# Patient Record
Sex: Female | Born: 1945
Health system: Southern US, Community
[De-identification: ages and names within clinical notes are randomized; demographics above are authoritative.]

## PROBLEM LIST (undated history)

## (undated) DIAGNOSIS — H269 Unspecified cataract: Secondary | ICD-10-CM

## (undated) DIAGNOSIS — I10 Essential (primary) hypertension: Secondary | ICD-10-CM

## (undated) DIAGNOSIS — C801 Malignant (primary) neoplasm, unspecified: Secondary | ICD-10-CM

## (undated) DIAGNOSIS — T7840XA Allergy, unspecified, initial encounter: Secondary | ICD-10-CM

## (undated) DIAGNOSIS — H8109 Meniere's disease, unspecified ear: Secondary | ICD-10-CM

## (undated) HISTORY — DX: Meniere's disease, unspecified ear: H81.09

## (undated) HISTORY — DX: Essential (primary) hypertension: I10

## (undated) HISTORY — PX: ABDOMINAL HYSTERECTOMY: SHX81

## (undated) HISTORY — DX: Allergy, unspecified, initial encounter: T78.40XA

## (undated) HISTORY — DX: Malignant (primary) neoplasm, unspecified: C80.1

## (undated) HISTORY — PX: BREAST EXCISIONAL BIOPSY: SUR124

## (undated) HISTORY — DX: Unspecified cataract: H26.9

---

## 1989-04-24 HISTORY — PX: VAGINAL HYSTERECTOMY: SHX2639

## 1999-04-25 DIAGNOSIS — Z923 Personal history of irradiation: Secondary | ICD-10-CM

## 1999-04-25 HISTORY — PX: BREAST BIOPSY: SHX20

## 1999-04-25 HISTORY — DX: Personal history of irradiation: Z92.3

## 2000-01-25 HISTORY — PX: BREAST LUMPECTOMY: SHX2

## 2004-04-24 HISTORY — PX: COLONOSCOPY: SHX174

## 2018-05-02 ENCOUNTER — Ambulatory Visit (INDEPENDENT_AMBULATORY_CARE_PROVIDER_SITE_OTHER): Payer: Medicare Other | Admitting: Family Medicine

## 2018-05-02 ENCOUNTER — Encounter: Payer: Self-pay | Admitting: Family Medicine

## 2018-05-02 VITALS — BP 120/82 | HR 81 | Temp 98.1°F | Ht 63.0 in | Wt 136.5 lb

## 2018-05-02 DIAGNOSIS — B002 Herpesviral gingivostomatitis and pharyngotonsillitis: Secondary | ICD-10-CM | POA: Diagnosis not present

## 2018-05-02 DIAGNOSIS — H8103 Meniere's disease, bilateral: Secondary | ICD-10-CM | POA: Diagnosis not present

## 2018-05-02 DIAGNOSIS — Z23 Encounter for immunization: Secondary | ICD-10-CM | POA: Diagnosis not present

## 2018-05-02 DIAGNOSIS — I10 Essential (primary) hypertension: Secondary | ICD-10-CM | POA: Diagnosis not present

## 2018-05-02 DIAGNOSIS — L709 Acne, unspecified: Secondary | ICD-10-CM | POA: Diagnosis not present

## 2018-05-02 DIAGNOSIS — M792 Neuralgia and neuritis, unspecified: Secondary | ICD-10-CM | POA: Insufficient documentation

## 2018-05-02 MED ORDER — AMLODIPINE BESYLATE-VALSARTAN 5-320 MG PO TABS: 1.0000 | ORAL_TABLET | Freq: Every day | ORAL | 5 refills | Status: DC

## 2018-05-02 MED ORDER — TRAZODONE HCL 50 MG PO TABS: 25.0000 mg | ORAL_TABLET | Freq: Every evening | ORAL | 3 refills | Status: DC | PRN

## 2018-05-02 MED ORDER — MECLIZINE HCL 25 MG PO TABS: 25.0000 mg | ORAL_TABLET | Freq: Three times a day (TID) | ORAL | 0 refills | Status: DC | PRN

## 2018-05-02 MED ORDER — ACYCLOVIR 5 % EX OINT: 1.0000 "application " | TOPICAL_OINTMENT | CUTANEOUS | 2 refills | Status: DC

## 2018-05-02 MED ORDER — VALACYCLOVIR HCL 500 MG PO TABS: ORAL_TABLET | ORAL | 2 refills | Status: DC

## 2018-05-02 MED ORDER — VENLAFAXINE HCL ER 37.5 MG PO CP24: 37.5000 mg | ORAL_CAPSULE | Freq: Every day | ORAL | 2 refills | Status: DC

## 2018-05-02 MED ORDER — MINOCYCLINE HCL 50 MG PO CAPS: 50.0000 mg | ORAL_CAPSULE | Freq: Two times a day (BID) | ORAL | 2 refills | Status: DC

## 2018-05-02 MED ORDER — CLINDAMYCIN PHOSPHATE 1 % EX LOTN: TOPICAL_LOTION | Freq: Two times a day (BID) | CUTANEOUS | 5 refills | Status: DC

## 2018-05-02 NOTE — Progress Notes (Signed)
Chief Complaint  Patient presents with  . New Patient (Initial Visit)       New Patient Visit SUBJECTIVE: HPI: Carolyn Mckinney is an 73 y.o.female who is being seen for establishing care.   The patient was previously seen [place].  All of her medicines are [place] medications and not carried to the [place].  Hypertension Patient presents for hypertension follow up. She does not routinely monitor home blood pressures. She is compliant with medications- amlodipine equivalent and an ARB. Patient has these side effects of medication: none She is adhering to a healthy diet overall. Exercise: wt resistance, cardio   Hx of Meniere's disease. On Japanese meds that I do not recognize. Adenosine triphosphate, something that appears to be an antihistamine prn and difenidol hydrochloride.  She will intermittently get vertigo.  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  Testing for diabetes and thyroid disease were unremarkable in [place].  She takes an anti-inflammatory similar to ibuprofen and Lyrica for this.  She notes that it was helpful initially but no longer is.  There is no injury or change in activity.  She does not have any balance issues or weakness.  The patient has a history of acne.  She will take oral minocycline or topical antibiotic alternating.  She has a history of oral herpes.  She takes famciclovir and a topical antiviral for flares.  No current outbreaks.  No Known Allergies  Past Medical History:  Diagnosis Date  . Hypertension   . Meniere's disease    Past Surgical History:  Procedure Laterality Date  . ABDOMINAL HYSTERECTOMY    . BREAST LUMPECTOMY     Family History  Problem Relation Age of Onset  . Heart disease Father    No Known Allergies  Current Outpatient Medications:  .  acyclovir ointment (ZOVIRAX) 5 %, Apply 1 application topically every 3 (three) hours. Apply every 3 hours during a breakout., Disp: 30 g, Rfl: 2 .   amLODipine-valsartan (EXFORGE) 5-320 MG tablet, Take 1 tablet by mouth daily., Disp: 30 tablet, Rfl: 5 .  clindamycin (CLEOCIN T) 1 % lotion, Apply topically 2 (two) times daily., Disp: 60 mL, Rfl: 5 .  meclizine (ANTIVERT) 25 MG tablet, Take 1 tablet (25 mg total) by mouth 3 (three) times daily as needed for dizziness., Disp: 30 tablet, Rfl: 0 .  minocycline (MINOCIN,DYNACIN) 50 MG capsule, Take 1 capsule (50 mg total) by mouth 2 (two) times daily., Disp: 60 capsule, Rfl: 2 .  traZODone (DESYREL) 50 MG tablet, Take 0.5-1 tablets (25-50 mg total) by mouth at bedtime as needed for sleep., Disp: 30 tablet, Rfl: 3 .  valACYclovir (VALTREX) 500 MG tablet, Take 4 tabs and repeat in 12 hours at onset of a breakout., Disp: 24 tablet, Rfl: 2 .  venlafaxine XR (EFFEXOR-XR) 37.5 MG 24 hr capsule, Take 1 capsule (37.5 mg total) by mouth daily with breakfast., Disp: 30 capsule, Rfl: 2  ROS Cardiovascular: Denies chest pain  Respiratory: Denies dyspnea   OBJECTIVE: BP 120/82 (BP Location: Left Arm, Patient Position: Sitting, Cuff Size: Normal)   Pulse 81   Temp 98.1 F (36.7 C) (Oral)   Ht 5\' 3"  (1.6 m)   Wt 136 lb 8 oz (61.9 kg)   SpO2 97%   BMI 24.18 kg/m   Constitutional: -  VS reviewed -  Well developed, well nourished, appears stated age -  No apparent distress  Psychiatric: -  Oriented to person, place, and time -  Memory intact -  Affect and mood normal -  Fluent conversation, good eye contact -  Judgment and insight age appropriate  Eye: -  Conjunctivae clear, no discharge -  Pupils symmetric, round, reactive to light  ENMT: -  MMM    Pharynx moist, no exudate, no erythema  Neck: -  No gross swelling, no palpable masses -  Thyroid midline, not enlarged, mobile, no palpable masses  Cardiovascular: -  RRR -  2+ DP pulses bilaterally -  No bruits -  No LE edema  Respiratory: -  Normal respiratory effort, no accessory muscle use, no retraction -  Breath sounds equal, no wheezes, no  ronchi, no crackles  Neurological:  -  CN II - XII grossly intact -  Sensation intact to pinprick on feet  Musculoskeletal: -  No clubbing, no cyanosis -  Gait normal  Skin: -  No significant acne on expection -  There are black macules on lower extremities bilaterally -  Warm and dry to palpation   ASSESSMENT/PLAN: Meniere disease, bilateral - Plan: meclizine (ANTIVERT) 25 MG tablet, Ambulatory referral to ENT  Essential hypertension - Plan: amLODipine-valsartan (EXFORGE) 5-320 MG tablet  Neuropathic pain - Plan: traZODone (DESYREL) 50 MG tablet, venlafaxine XR (EFFEXOR-XR) 37.5 MG 24 hr capsule  Acne, unspecified acne type - Plan: minocycline (MINOCIN,DYNACIN) 50 MG capsule, clindamycin (CLEOCIN T) 1 % lotion  Need for tetanus booster - Plan: Tdap vaccine greater than or equal to 7yo IM  Need for vaccination against Streptococcus pneumoniae - Plan: Pneumococcal polysaccharide vaccine 23-valent greater than or equal to 74yo subcutaneous/IM  Oral herpes - Plan: valACYclovir (VALTREX) 500 MG tablet, acyclovir ointment (ZOVIRAX) 5 %  Some alternatives/equivalents were called in.  I had difficulty with Mnire's disease medications.  Will refer to ENT for further management. Will change anti-inflammatory/Lyrica to venlafaxine. Vaccines due today. Continue antiviral as needed for oral herpes. Continue acne regimen. Call pharmacy for Shingrix. Patient should return in 1 mo to recheck neuropathy. The patient voiced understanding and agreement to the plan.  Greater than 45 minutes were spent face to face with the patient with greater than 50% of this time spent counseling on medication reconciliation, diagnosis, lab review, prognosis, medication prescription, new managements, immunizations and follow up.   [place], DO 05/02/18  5:15 PM

## 2018-05-02 NOTE — Progress Notes (Signed)
Pre visit review using our clinic review tool, if applicable. No additional management support is needed unless otherwise documented below in the visit note. 

## 2018-05-02 NOTE — Patient Instructions (Addendum)
Keep the diet clean and stay active.  We did the best we could with your medications and translating things to American equivalents. I had the toughest time with the Meniere's Disease and that is why we are setting you up with the ENT team.   If you do not hear anything about your referral in the next 1-2 weeks, call our office and ask for an update.  Call the pharmacy to set up the Shingrix (shingles vaccine).   Let us know if you need anything.

## 2018-05-22 DIAGNOSIS — H8103 Meniere's disease, bilateral: Secondary | ICD-10-CM | POA: Diagnosis not present

## 2018-05-30 ENCOUNTER — Ambulatory Visit: Payer: Medicare Other | Admitting: Family Medicine

## 2018-05-30 ENCOUNTER — Encounter: Payer: Self-pay | Admitting: Family Medicine

## 2018-05-30 ENCOUNTER — Other Ambulatory Visit: Payer: Self-pay | Admitting: Family Medicine

## 2018-06-03 ENCOUNTER — Encounter: Payer: Self-pay | Admitting: Family Medicine

## 2018-06-03 ENCOUNTER — Ambulatory Visit (INDEPENDENT_AMBULATORY_CARE_PROVIDER_SITE_OTHER): Payer: Medicare Other | Admitting: Family Medicine

## 2018-06-03 VITALS — BP 120/62 | HR 87 | Temp 97.8°F | Ht 63.0 in | Wt 140.0 lb

## 2018-06-03 DIAGNOSIS — M792 Neuralgia and neuritis, unspecified: Secondary | ICD-10-CM | POA: Diagnosis not present

## 2018-06-03 DIAGNOSIS — L709 Acne, unspecified: Secondary | ICD-10-CM | POA: Diagnosis not present

## 2018-06-03 DIAGNOSIS — G4700 Insomnia, unspecified: Secondary | ICD-10-CM | POA: Insufficient documentation

## 2018-06-03 DIAGNOSIS — B002 Herpesviral gingivostomatitis and pharyngotonsillitis: Secondary | ICD-10-CM | POA: Diagnosis not present

## 2018-06-03 MED ORDER — NORTRIPTYLINE HCL 25 MG PO CAPS: 25.0000 mg | ORAL_CAPSULE | Freq: Every day | ORAL | 1 refills | Status: DC

## 2018-06-03 MED ORDER — MINOCYCLINE HCL 100 MG PO CAPS: 100.0000 mg | ORAL_CAPSULE | Freq: Two times a day (BID) | ORAL | 5 refills | Status: DC

## 2018-06-03 MED ORDER — TRAZODONE HCL 50 MG PO TABS: 50.0000 mg | ORAL_TABLET | Freq: Every evening | ORAL | 1 refills | Status: DC | PRN

## 2018-06-03 MED ORDER — MINOCYCLINE HCL 100 MG PO CAPS: 100.0000 mg | ORAL_CAPSULE | Freq: Two times a day (BID) | ORAL | 1 refills | Status: DC

## 2018-06-03 MED ORDER — VALACYCLOVIR HCL 500 MG PO TABS: 500.0000 mg | ORAL_TABLET | Freq: Every day | ORAL | 1 refills | Status: DC

## 2018-06-03 NOTE — Progress Notes (Signed)
Chief Complaint  Patient presents with  . Follow-up    Subjective: Patient is a 73 y.o. female here for reck neuropathy.  Lyrica and ibuprofen in past was not helpful. Started on Effexor 37.5 mg/d, was compliant but it was not helpful. Worsens when she gets poor sleep.   Sleep improved with Trazodone. Tolerating well. Might be related to time zone changes after moving back from [place].   BP well controlled on current meds.  Acne coming back. Was on minocycline 100 mg bid, but I restarted her on 50 mg bid. Tolerating well otherwise.  Hx of cold sores, worse with stress after moving. Was on topical acyclovir in [place] but $200 here. Abreva in past has been helpful. On Valacyclovir now.   ROS: Neuro: +dizziness Psych: No insomnia  Past Medical History:  Diagnosis Date  . Allergy   . Cancer (HCC)    Breast  . Hypertension   . Meniere's disease    Objective: BP 120/62 (BP Location: Left Arm)   Pulse 87   Temp 97.8 F (36.6 C) (Oral)   Ht 5\' 3"  (1.6 m)   Wt 140 lb (63.5 kg)   SpO2 97%   BMI 24.80 kg/m  General: Awake, appears stated age HEENT: MMM, EOMi Heart: RRR, no murmurs Lungs: CTAB, no rales, wheezes or rhonchi. No accessory muscle use Psych: Age appropriate judgment and insight, normal affect and mood  Assessment and Plan: Neuropathic pain - Plan: nortriptyline (PAMELOR) 25 MG capsule  Oral herpes - Plan: valACYclovir (VALTREX) 500 MG tablet  Acne, unspecified acne type - Plan: minocycline (MINOCIN,DYNACIN) 100 MG capsule  Insomnia, unspecified type - Plan: traZODone (DESYREL) 50 MG tablet  Stop SNRI, start TCA. 500 mg/d for prophylaxis.  Increase dose of Mino. Cont Trazodone.  F/u in 2 mo (traveling to [facility] for 6 weeks this Sat). The patient voiced understanding and agreement to the plan.  [place], DO 06/03/18  2:27 PM

## 2018-06-03 NOTE — Patient Instructions (Signed)
Stay hydrated.  Consider drinking things with electrolytes like Gatorade/Powerade.  Some changes have been made with your medications. This should be clear at pharmacy and on your paperwork if there are any questions.   Let us know if you need anything.

## 2018-07-15 ENCOUNTER — Encounter: Payer: Self-pay | Admitting: Family Medicine

## 2018-07-16 ENCOUNTER — Encounter: Payer: Self-pay | Admitting: Family Medicine

## 2018-07-18 ENCOUNTER — Ambulatory Visit: Payer: Medicare Other | Admitting: Family Medicine

## 2018-07-25 ENCOUNTER — Other Ambulatory Visit: Payer: Self-pay | Admitting: Family Medicine

## 2018-07-25 DIAGNOSIS — M792 Neuralgia and neuritis, unspecified: Secondary | ICD-10-CM

## 2018-08-02 ENCOUNTER — Ambulatory Visit: Payer: Medicare Other | Admitting: Family Medicine

## 2018-08-07 ENCOUNTER — Encounter: Payer: Self-pay | Admitting: Family Medicine

## 2018-08-08 ENCOUNTER — Encounter: Payer: Self-pay | Admitting: Family Medicine

## 2018-08-09 ENCOUNTER — Encounter: Payer: Self-pay | Admitting: Family Medicine

## 2018-08-09 ENCOUNTER — Other Ambulatory Visit: Payer: Self-pay

## 2018-08-09 ENCOUNTER — Ambulatory Visit (INDEPENDENT_AMBULATORY_CARE_PROVIDER_SITE_OTHER): Payer: Medicare Other | Admitting: Family Medicine

## 2018-08-09 DIAGNOSIS — M792 Neuralgia and neuritis, unspecified: Secondary | ICD-10-CM | POA: Diagnosis not present

## 2018-08-09 DIAGNOSIS — G4700 Insomnia, unspecified: Secondary | ICD-10-CM

## 2018-08-09 MED ORDER — GABAPENTIN 100 MG PO CAPS: ORAL_CAPSULE | ORAL | 3 refills | Status: DC

## 2018-08-09 MED ORDER — TRAZODONE HCL 50 MG PO TABS: 50.0000 mg | ORAL_TABLET | Freq: Every evening | ORAL | 3 refills | Status: DC | PRN

## 2018-08-09 NOTE — Progress Notes (Signed)
CC: neuropathy  Subjective: Patient is a 73 y.o. female here for neuropathic pain. Due to outbreak, we are interacting via web portal for an electronic face-to-face visit. I verified patient's ID using 2 identifiers.   Discussed this on 06/03/2018. Was initially started on Effexor, changed to nortriptyline after the former was not helpful. Nortriptyline made her feel like her skin was crawling so she stopped. Still having issues. Not worsening, but it is affecting her sleep.  Trazodone helpful, but having to take more. Seems to work when she takes 2 tabs instead of one. No AE's with medication.   ROS: Neuro: As noted in HPI Psych: +insomnia  Past Medical History:  Diagnosis Date  . Allergy   . Cancer (HCC)    Breast  . Hypertension   . Meniere's disease     Objective: No conversational dyspnea Age appropriate judgment and insight Nml affect and mood  Assessment and Plan: Neuropathic pain - Plan: gabapentin (NEURONTIN) 100 MG capsule  Insomnia, unspecified type - Plan: traZODone (DESYREL) 50 MG tablet  Stop TCA, start neurontin. 200 mg in evening, 100 mg in AM. Increase dose of trazodone to 50-100 mg qhs prn.  F/u in 5 weeks.  The patient voiced understanding and agreement to the plan.  [place], DO 08/09/18  9:43 AM

## 2018-09-13 ENCOUNTER — Encounter: Payer: Self-pay | Admitting: Family Medicine

## 2018-09-13 ENCOUNTER — Other Ambulatory Visit: Payer: Self-pay

## 2018-09-13 ENCOUNTER — Ambulatory Visit (INDEPENDENT_AMBULATORY_CARE_PROVIDER_SITE_OTHER): Payer: Medicare Other | Admitting: Family Medicine

## 2018-09-13 DIAGNOSIS — G4700 Insomnia, unspecified: Secondary | ICD-10-CM | POA: Diagnosis not present

## 2018-09-13 DIAGNOSIS — M792 Neuralgia and neuritis, unspecified: Secondary | ICD-10-CM

## 2018-09-13 DIAGNOSIS — R42 Dizziness and giddiness: Secondary | ICD-10-CM

## 2018-09-13 MED ORDER — TRIAMTERENE-HCTZ 37.5-25 MG PO TABS: 1.0000 | ORAL_TABLET | Freq: Every day | ORAL | 3 refills | Status: DC

## 2018-09-13 MED ORDER — GABAPENTIN 100 MG PO CAPS: ORAL_CAPSULE | ORAL | 3 refills | Status: DC

## 2018-09-13 NOTE — Progress Notes (Signed)
Chief Complaint  Patient presents with  . Follow-up    Subjective: Patient is a 73 y.o. female here for f/u neuropathic pain. Due to COVID-19 pandemic, we are interacting via web portal for an electronic face-to-face visit. I verified patient's ID using 2 identifiers. Patient agreed to proceed with visit via this method. Patient is at home, I am at office. Patient and I are present for visit.   Pt failed both nortriptyline and Effexor for neuropathic pain. Started on Neurontin at last visit. Reports worsening of pins/needles sensation in hands and feet. No AE's and was compliant with medication.  She also has a hx of spots on her lower ext that she is wondering if it could be related. They never fade, was told in [place] it is due to an old BP med she was taking. Biopsy revealed no CA.   Trazodone dosage also increased. Pt reports this works well when seh takes it. Would like to find out why she is unable to sleep. She admits she has not been on a steady routine since moving form [place]. She just got back from [place] also. Takes Benadryl and melatonin also.  Continues to have vertigo. Saw ENT for hx of Menire Dz, currently taking Maxzide with no improvement of s/s's. Tolerating med well w/o AE's.    ROS: Neuro: As noted in HPI Psych: +insomnia  Past Medical History:  Diagnosis Date  . Allergy   . Cancer (HCC)    Breast  . Hypertension   . Meniere's disease     Objective: No conversational dyspnea Age appropriate judgment and insight Nml affect and mood  Assessment and Plan: Neuropathic pain - Plan: Ambulatory referral to Neurology  Insomnia, unspecified type  Vertigo - Plan: Ambulatory referral to Neurology  Increase gabapentin to 200 mg in AM and afternoon, 400 mg before bed. Refer to neuro.  Cont Trazodone. [facility] info given. Sleep hygiene info given. Cont Maxzide, get Neuro's opinion. F/u in 6 mo for med ck. The patient voiced understanding and agreement to the  plan.  [place], DO 09/13/18  2:05 PM

## 2018-09-13 NOTE — Telephone Encounter (Signed)
Unable to leave voice message to inform pt to Return in about 6 months (around 03/16/2019) for HTN ck.

## 2018-11-06 ENCOUNTER — Other Ambulatory Visit: Payer: Self-pay | Admitting: Family Medicine

## 2018-11-06 DIAGNOSIS — B002 Herpesviral gingivostomatitis and pharyngotonsillitis: Secondary | ICD-10-CM

## 2018-11-06 MED ORDER — AMLODIPINE BESYLATE 5 MG PO TABS: 5.0000 mg | ORAL_TABLET | Freq: Every day | ORAL | 1 refills | Status: DC

## 2018-11-06 MED ORDER — VALACYCLOVIR HCL 500 MG PO TABS: 500.0000 mg | ORAL_TABLET | Freq: Every day | ORAL | 1 refills | Status: DC

## 2018-11-06 MED ORDER — VALSARTAN 320 MG PO TABS: 320.0000 mg | ORAL_TABLET | Freq: Every day | ORAL | 1 refills | Status: DC

## 2018-11-06 NOTE — Telephone Encounter (Signed)
Refills sent

## 2018-11-06 NOTE — Telephone Encounter (Signed)
Medication Refill - Medication: amLODipine (NORVASC) 5 MG tablet and valsartan (DIOVAN) 320 MG tablet and valACYclovir (VALTREX) 500 MG tablet   Preferred Pharmacy (with phone number or street name):  [facility], [place]. [phone] (Phone) [phone] (Fax)

## 2018-11-25 DIAGNOSIS — M9901 Segmental and somatic dysfunction of cervical region: Secondary | ICD-10-CM | POA: Diagnosis not present

## 2018-11-25 DIAGNOSIS — M50323 Other cervical disc degeneration at C6-C7 level: Secondary | ICD-10-CM | POA: Diagnosis not present

## 2018-11-25 DIAGNOSIS — M5117 Intervertebral disc disorders with radiculopathy, lumbosacral region: Secondary | ICD-10-CM | POA: Diagnosis not present

## 2018-11-25 DIAGNOSIS — M62830 Muscle spasm of back: Secondary | ICD-10-CM | POA: Diagnosis not present

## 2018-11-25 DIAGNOSIS — M9903 Segmental and somatic dysfunction of lumbar region: Secondary | ICD-10-CM | POA: Diagnosis not present

## 2018-11-28 DIAGNOSIS — M50323 Other cervical disc degeneration at C6-C7 level: Secondary | ICD-10-CM | POA: Diagnosis not present

## 2018-11-28 DIAGNOSIS — M5117 Intervertebral disc disorders with radiculopathy, lumbosacral region: Secondary | ICD-10-CM | POA: Diagnosis not present

## 2018-11-28 DIAGNOSIS — M9901 Segmental and somatic dysfunction of cervical region: Secondary | ICD-10-CM | POA: Diagnosis not present

## 2018-11-28 DIAGNOSIS — M9903 Segmental and somatic dysfunction of lumbar region: Secondary | ICD-10-CM | POA: Diagnosis not present

## 2018-11-28 DIAGNOSIS — M62830 Muscle spasm of back: Secondary | ICD-10-CM | POA: Diagnosis not present

## 2018-12-19 ENCOUNTER — Ambulatory Visit (INDEPENDENT_AMBULATORY_CARE_PROVIDER_SITE_OTHER): Payer: Medicare Other

## 2018-12-19 ENCOUNTER — Other Ambulatory Visit: Payer: Self-pay

## 2018-12-19 DIAGNOSIS — Z23 Encounter for immunization: Secondary | ICD-10-CM

## 2019-01-13 ENCOUNTER — Encounter: Payer: Self-pay | Admitting: Family Medicine

## 2019-01-14 ENCOUNTER — Telehealth: Payer: Self-pay | Admitting: Family Medicine

## 2019-01-14 NOTE — Telephone Encounter (Signed)
Copied from [place] [phone] [id number]. Topic: Appointment Scheduling - Scheduling Inquiry for Clinic >> Jan 14, 2019  1:21 PM Carolyn Mckinney wrote: Reason for CRM: pt need acute appt to determine if she has shingles. Attempt to call office but no answer  Called the patient twice and no answer///no voice mail to leave a message

## 2019-01-15 ENCOUNTER — Ambulatory Visit (INDEPENDENT_AMBULATORY_CARE_PROVIDER_SITE_OTHER): Payer: Medicare Other | Admitting: Family Medicine

## 2019-01-15 ENCOUNTER — Encounter: Payer: Self-pay | Admitting: Family Medicine

## 2019-01-15 ENCOUNTER — Other Ambulatory Visit: Payer: Self-pay

## 2019-01-15 VITALS — BP 122/64 | HR 96 | Temp 97.3°F | Ht 63.0 in | Wt 146.2 lb

## 2019-01-15 DIAGNOSIS — L299 Pruritus, unspecified: Secondary | ICD-10-CM

## 2019-01-15 DIAGNOSIS — M792 Neuralgia and neuritis, unspecified: Secondary | ICD-10-CM

## 2019-01-15 MED ORDER — PREGABALIN 150 MG PO CAPS: 150.0000 mg | ORAL_CAPSULE | Freq: Two times a day (BID) | ORAL | 2 refills | Status: DC

## 2019-01-15 MED ORDER — PREDNISONE 20 MG PO TABS: 40.0000 mg | ORAL_TABLET | Freq: Every day | ORAL | 0 refills | Status: AC

## 2019-01-15 NOTE — Progress Notes (Signed)
Chief Complaint  Patient presents with  . Herpes Zoster  . Medication Problem    Gabapentin problems//not helping     Carolyn Mckinney is a 73 y.o. female here for a skin complaint.  Duration: 3 weeks Location: R chest and upper R back Pruritic? Yes Painful? Yes, ache over the top Drainage? No New soaps/lotions/topicals/detergents? No Sick contacts? No Therapies tried thus far: topical abx irritated things  Patient continues to have problems with neuropathy in her lower extremities.  She is compliant with her gabapentin.  Dosage increase is have not helped.  She was referred to the neurology team almost 4 months ago.  Both our office and at the neurology office has tried to reach out to her to no avail.  She confirmed her contact information today.  She does not have any side effects with the gabapentin and reports continued compliance.  She does not recall being on any other medication for neuropathy.  I had placed her on Effexor and nortriptyline without relief.  ROS:  Const: No fevers Skin: As noted in HPI  Past Medical History:  Diagnosis Date  . Allergy   . Cancer (HCC)    Breast  . Hypertension   . Meniere's disease     BP 122/64 (BP Location: Left Arm, Patient Position: Sitting, Cuff Size: Normal)   Pulse 96   Temp (!) 97.3 F (36.3 C) (Temporal)   Ht 5\' 3"  (1.6 m)   Wt 146 lb 4 oz (66.3 kg)   SpO2 97%   BMI 25.91 kg/m  Gen: awake, alert, appearing stated age Lungs: No accessory muscle use Skin: No external lesions, there are 2 discrete areas of an excoriated and erythematous papule. No drainage, vesicles, TTP, fluctuance, excoriation Psych: Age appropriate judgment and insight  Pruritus - Plan: predniSONE (DELTASONE) 20 MG tablet  Neuropathic pain - Plan: pregabalin (LYRICA) 150 MG capsule  1-this does not look like shingles to me.  It does not follow the temple shingles either.  She cannot reach the area of itchiness on her back, will do an oral steroid.   Avoid scented products.  Try to moisturize as reach will allow. 2-change gabapentin to pregabalin.  She was given the contact information for the neurology team's office.  She agrees to call them. F/u prn. The patient voiced understanding and agreement to the plan.  [place], DO 01/15/19 12:13 PM

## 2019-01-15 NOTE — Patient Instructions (Addendum)
Try not to itch. This does not look like Shingles.   Ice/cold pack over area for 10-15 min twice daily.  Call this number for the Neurology team: [phone]  Let us know if you need anything.

## 2019-01-26 ENCOUNTER — Encounter: Payer: Self-pay | Admitting: Family Medicine

## 2019-01-26 DIAGNOSIS — G4700 Insomnia, unspecified: Secondary | ICD-10-CM

## 2019-01-27 ENCOUNTER — Encounter: Payer: Self-pay | Admitting: Family Medicine

## 2019-01-27 ENCOUNTER — Other Ambulatory Visit: Payer: Self-pay | Admitting: Family Medicine

## 2019-01-27 MED ORDER — TRIAMTERENE-HCTZ 37.5-25 MG PO TABS: 1.0000 | ORAL_TABLET | Freq: Every day | ORAL | 3 refills | Status: DC

## 2019-01-27 MED ORDER — TRAZODONE HCL 50 MG PO TABS: 50.0000 mg | ORAL_TABLET | Freq: Every evening | ORAL | 3 refills | Status: DC | PRN

## 2019-01-27 NOTE — Telephone Encounter (Signed)
Refill sent.

## 2019-01-27 NOTE — Telephone Encounter (Signed)
Pt called in to follow up on refill request. Pt says that she also need Rx for triamterene-hydrochlorothiazide (MAXZIDE-25) 37.5-25 MG tablet, pt says that she switched pharmacies.    Pharmacy: [facility], [place], [place]

## 2019-02-06 ENCOUNTER — Encounter: Payer: Self-pay | Admitting: Family Medicine

## 2019-02-07 ENCOUNTER — Other Ambulatory Visit: Payer: Self-pay | Admitting: Family Medicine

## 2019-02-07 DIAGNOSIS — M792 Neuralgia and neuritis, unspecified: Secondary | ICD-10-CM

## 2019-02-07 NOTE — Progress Notes (Signed)
am

## 2019-02-27 ENCOUNTER — Encounter: Payer: Self-pay | Admitting: Neurology

## 2019-03-07 ENCOUNTER — Encounter: Payer: Self-pay | Admitting: Family Medicine

## 2019-03-10 ENCOUNTER — Encounter: Payer: Self-pay | Admitting: Family Medicine

## 2019-03-13 ENCOUNTER — Encounter: Payer: Self-pay | Admitting: Family Medicine

## 2019-03-14 ENCOUNTER — Ambulatory Visit (INDEPENDENT_AMBULATORY_CARE_PROVIDER_SITE_OTHER): Payer: Medicare Other | Admitting: Family Medicine

## 2019-03-14 ENCOUNTER — Other Ambulatory Visit: Payer: Self-pay

## 2019-03-14 ENCOUNTER — Encounter: Payer: Self-pay | Admitting: Family Medicine

## 2019-03-14 DIAGNOSIS — Z5329 Procedure and treatment not carried out because of patient's decision for other reasons: Secondary | ICD-10-CM

## 2019-03-14 DIAGNOSIS — Z91199 Patient's noncompliance with other medical treatment and regimen due to unspecified reason: Secondary | ICD-10-CM

## 2019-03-14 NOTE — Progress Notes (Signed)
Despite several attempts, we could not reach the patient.

## 2019-03-17 ENCOUNTER — Ambulatory Visit (INDEPENDENT_AMBULATORY_CARE_PROVIDER_SITE_OTHER): Payer: Medicare Other | Admitting: Family Medicine

## 2019-03-17 ENCOUNTER — Encounter: Payer: Self-pay | Admitting: Family Medicine

## 2019-03-17 DIAGNOSIS — L709 Acne, unspecified: Secondary | ICD-10-CM

## 2019-03-17 MED ORDER — BENZOYL PEROXIDE 5 % EX GEL: Freq: Every day | CUTANEOUS | 0 refills | Status: DC

## 2019-03-17 NOTE — Progress Notes (Signed)
Chief Complaint  Patient presents with  . Medication Problem    minocycline not working well    Carolyn Mckinney is a 73 y.o. female here for a skin complaint. Due to COVID-19 pandemic, we are interacting via web portal for an electronic face-to-face visit. I verified patient's ID using 2 identifiers. Patient agreed to proceed with visit via this method. Patient is at home, I am at office. Patient and I are present for visit.   F/u acne, currently on Minocycline 100 mg/d. No AE's, reports compliance. Hx of scarring. Has been on a variety of abx in past and even a PO retinoid that was temporary. No drainage or current scarring. Not using any topicals right now.   ROS:  Const: No fevers Skin: As noted in HPI  Past Medical History:  Diagnosis Date  . Allergy   . Cancer (HCC)    Breast  . Hypertension   . Meniere's disease    Exam No conversational dyspnea Age appropriate judgment and insight Nml affect and mood  Acne, unspecified acne type - Plan: benzoyl peroxide 5 % gel  Cont minocycline, start above.  F/u in 4 weeks. The patient voiced understanding and agreement to the plan.  [place], DO 03/17/19 3:05 PM

## 2019-04-03 ENCOUNTER — Other Ambulatory Visit: Payer: Self-pay

## 2019-04-04 ENCOUNTER — Encounter: Payer: Self-pay | Admitting: Neurology

## 2019-04-04 ENCOUNTER — Other Ambulatory Visit: Payer: Self-pay

## 2019-04-04 ENCOUNTER — Ambulatory Visit (INDEPENDENT_AMBULATORY_CARE_PROVIDER_SITE_OTHER): Payer: Medicare Other | Admitting: Neurology

## 2019-04-04 VITALS — BP 145/67 | HR 99 | Resp 16 | Ht 63.0 in | Wt 149.0 lb

## 2019-04-04 DIAGNOSIS — R202 Paresthesia of skin: Secondary | ICD-10-CM

## 2019-04-04 NOTE — Progress Notes (Signed)
[facility] Clinic Note - Initial Visit   Date: 04/04/19  Carolyn Mckinney MRN: [id number] DOB: [birth year]   Dear Dr. Nani Ravens:   Thank you for your kind referral of Carolyn Mckinney for consultation of bilateral leg pain. Although her history is well known to you, please allow [place] to reiterate it for the purpose of our medical record. The patient was accompanied to the clinic by self.    History of Present Illness: Carolyn Mckinney is a 73 y.o. left-handed female with hypertension, breast cancer and Meniere's disease presenting for evaluation of bilateral leg pain.  Starting around 2010, she was having burning sensation over the feet and soles.  Over the past several years, symptoms have become worse and extended into the ankle region.  No radicular leg pain. 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  She was evaluated for these symptoms in [place] and offered a trial of Lyrica and NSAIDs with no relief.  She established care with Dr. Nani Ravens here who has tried venlafaxine, nortriptyline, and gabapentin with no relief.  Currently, she takes Lyrica 150mg  BID which eases some of her pain.  She has no history of diabetes. She drinks 1-2 glasses of wine nightly.     Past Medical History:  Diagnosis Date  . Allergy   . Cancer (HCC)    Breast  . Hypertension   . Meniere's disease     Past Surgical History:  Procedure Laterality Date  . ABDOMINAL HYSTERECTOMY    . BREAST LUMPECTOMY       Medications:  Outpatient Encounter Medications as of 04/04/2019  Medication Sig  . amLODipine (NORVASC) 5 MG tablet Take 1 tablet (5 mg total) by mouth daily.  . benzoyl peroxide 5 % gel Apply topically daily.  . minocycline (MINOCIN,DYNACIN) 100 MG capsule Take 1 capsule (100 mg total) by mouth 2 (two) times daily.  . pregabalin (LYRICA) 150 MG capsule Take 1 capsule (150 mg total) by mouth 2 (two)  times daily.  . traZODone (DESYREL) 50 MG tablet Take 1-2 tablets (50-100 mg total) by mouth at bedtime as needed for sleep.  Marland Kitchen triamterene-hydrochlorothiazide (MAXZIDE-25) 37.5-25 MG tablet Take 1 tablet by mouth daily.  . valACYclovir (VALTREX) 500 MG tablet Take 1 tablet (500 mg total) by mouth daily. Take 4 tabs and repeat in 12 hours at onset of a breakout.  . valsartan (DIOVAN) 320 MG tablet Take 1 tablet (320 mg total) by mouth daily.   No facility-administered encounter medications on file as of 04/04/2019.    Allergies: No Known Allergies  Family History: Family History  Problem Relation Age of Onset  . Arthritis Mother   . Heart disease Father     Social History: Social History   Tobacco Use  . Smoking status: Never Smoker  . Smokeless tobacco: Never Used  Substance Use Topics  . Alcohol use: Yes    Comment: just wine  . Drug use: Never   Social History   Social History Narrative  . Not on file    Review of Systems:  CONSTITUTIONAL: No fevers, chills, night sweats, or weight loss.   EYES: No visual changes or eye pain  ENT: No hearing changes.  No history of nose bleeds.   +vertigo RESPIRATORY: No cough, wheezing and shortness of breath.   CARDIOVASCULAR: Negative for chest pain, and palpitations.   GI: Negative for abdominal discomfort, blood in stools or black  stools.  No recent change in bowel habits.   GU:  No history of incontinence.   MUSCLOSKELETAL: No history of joint pain or swelling.  No myalgias.   SKIN: +for lesions, rash, and itching.   HEMATOLOGY/ONCOLOGY: Negative for prolonged bleeding, bruising easily, and swollen nodes.  No history of cancer.   ENDOCRINE: Negative for cold or heat intolerance, polydipsia or goiter.   PSYCH:  No depression or anxiety symptoms.   NEURO: As Above.   Vital Signs:  BP (!) 145/67 (BP Location: Left Arm, Patient Position: Sitting)   Pulse 99   Resp 16   Ht 5\' 3"  (1.6 m)   Wt 149 lb (67.6 kg)   SpO2 98%    BMI 26.39 kg/m    General Medical Exam:   General:  Well appearing, comfortable, anxious at times.   Eyes/ENT: see cranial nerve examination.   Neck:   No carotid bruits. Respiratory:  Clear to auscultation, good air entry bilaterally.   Cardiac:  Regular rate and rhythm, no murmur.   Extremities:  No deformities, edema, or skin discoloration.  Skin:  Dark patchy macular pigmentation over the lower legs and feet.    Neurological Exam: MENTAL STATUS including orientation to time, place, person, recent and remote memory is normal. Mildly delayed when answering questions, circumfrential.  Language, and fund of knowledge is fairly intact.  Speech is not dysarthric  CRANIAL NERVES: II:  No visual field defects.   III-IV-VI: Pupils equal round and reactive to light.  Normal conjugate, extra-ocular eye movements in all directions of gaze.  No nystagmus.  No ptosis   VII:  Normal facial symmetry and movements.   VIII:  Normal hearing and vestibular function.   IX-X:  Normal palatal movement.   XI:  Normal shoulder shrug and head rotation.   XII:  Normal tongue strength and range of motion, no deviation or fasciculation.  MOTOR:  No atrophy, fasciculations or abnormal movements.  No pronator drift.   Upper Extremity:  Right  Left  Deltoid  5/5   5/5   Biceps  5/5   5/5   Triceps  5/5   5/5   Infraspinatus 5/5  5/5  Medial pectoralis 5/5  5/5  Wrist extensors  5/5   5/5   Wrist flexors  5/5   5/5   Finger extensors  5/5   5/5   Finger flexors  5/5   5/5   Dorsal interossei  5/5   5/5   Abductor pollicis  5/5   5/5   Tone (Ashworth scale)  0  0   Lower Extremity:  Right  Left  Hip flexors  5/5   5/5   Hip extensors  5/5   5/5   Adductor 5/5  5/5  Abductor 5/5  5/5  Knee flexors  5/5   5/5   Knee extensors  5/5   5/5   Dorsiflexors  5/5   5/5   Plantarflexors  5/5   5/5   Toe extensors  5/5   5/5   Toe flexors  5/5   5/5   Tone (Ashworth scale)  0  0   MSRs:  Right         Left                  brachioradialis 2+  2+  biceps 2+  2+  triceps 2+  2+  patellar 2+  2+  ankle jerk 2+  2+  Hoffman no  no  plantar response down  down   SENSORY:  Normal and symmetric perception of light touch, pinprick, vibration, and proprioception.    COORDINATION/GAIT: Normal finger-to- nose-finger.  Intact rapid alternating movements bilaterally. Nonphysiological gait, unassisted.  Stressed gait intact.   IMPRESSION: Bilateral feet paresthesias and pain, suggestive of neuropathy, however exam shows normal distal reflexes and sensation which is atypical for neuropathy.   - NCS/EMG of the legs to characterize the nature of her symptoms  - Continue Lyrica 150mg  BID.  If titration of this does not help, consider Cymbalta next  Further recommendations pending results.   Thank you for allowing me to participate in patient's care.  If I can answer any additional questions, I would be pleased to do so.    Sincerely,    Mabry Tift K. Posey Pronto, DO

## 2019-04-04 NOTE — Patient Instructions (Signed)
Nerve testing of bilateral legs  ELECTROMYOGRAM AND NERVE CONDUCTION STUDIES (EMG/NCS) INSTRUCTIONS  How to Prepare The neurologist conducting the EMG will need to know if you have certain medical conditions. Tell the neurologist and other EMG lab personnel if you: Have a pacemaker or any other electrical medical device Take blood-thinning medications Have hemophilia, a blood-clotting disorder that causes prolonged bleeding Bathing Take a shower or bath shortly before your exam in order to remove oils from your skin. Don't apply lotions or creams before the exam.  What to Expect You'll likely be asked to change into a hospital gown for the procedure and lie down on an examination table. The following explanations can help you understand what will happen during the exam.  Electrodes. The neurologist or a technician places surface electrodes at various locations on your skin depending on where you're experiencing symptoms. Or the neurologist may insert needle electrodes at different sites depending on your symptoms.  Sensations. The electrodes will at times transmit a tiny electrical current that you may feel as a twinge or spasm. The needle electrode may cause discomfort or pain that usually ends shortly after the needle is removed. If you are concerned about discomfort or pain, you may want to talk to the neurologist about taking a short break during the exam.  Instructions. During the needle EMG, the neurologist will assess whether there is any spontaneous electrical activity when the muscle is at rest - activity that isn't present in healthy muscle tissue - and the degree of activity when you slightly contract the muscle.  He or she will give you instructions on resting and contracting a muscle at appropriate times. Depending on what muscles and nerves the neurologist is examining, he or she may ask you to change positions during the exam.  After your EMG You may experience some temporary, minor  bruising where the needle electrode was inserted into your muscle. This bruising should fade within several days. If it persists, contact your primary care doctor.   

## 2019-04-07 ENCOUNTER — Other Ambulatory Visit: Payer: Self-pay | Admitting: Family Medicine

## 2019-04-07 DIAGNOSIS — M792 Neuralgia and neuritis, unspecified: Secondary | ICD-10-CM

## 2019-04-08 ENCOUNTER — Other Ambulatory Visit: Payer: Self-pay

## 2019-04-08 ENCOUNTER — Ambulatory Visit (INDEPENDENT_AMBULATORY_CARE_PROVIDER_SITE_OTHER): Payer: Medicare Other | Admitting: Neurology

## 2019-04-08 DIAGNOSIS — R202 Paresthesia of skin: Secondary | ICD-10-CM | POA: Diagnosis not present

## 2019-04-08 NOTE — Procedures (Signed)
[facility] Neurology  [address]  [place], [place] [address] Tel: [phone] Fax:  [phone] Test Date:  04/08/2019  Patient: Carolyn Mckinney DOB: [birth year] Physician: Narda Amber, DO  Sex: Female Height: 5\' 3"  Ref Phys: Narda Amber, DO  ID#: [id number] Temp: 34.0C Technician:    Patient Complaints: This is a 73 year old female referred for evaluation of bilateral leg pain.  NCV & EMG Findings: Electrodiagnostic testing of the right lower extremity and additional studies of the left shows: 1. Bilateral sural and superficial peroneal sensory responses are within normal limits. 2. Bilateral peroneal and tibial motor responses are within normal limits. 3. Bilateral tibial H reflex studies are within normal limits. 4. There is no evidence of active or chronic motor axonal changes affecting any of the tested muscles.  Motor unit configuration and recruitment pattern is within normal limits.   Impression: This is a normal study of the lower extremities.  In particular, there is no evidence of a sensorimotor polyneuropathy or lumbosacral radiculopathy.   ___________________________ Narda Amber, DO    Nerve Conduction Studies Anti Sensory Summary Table   Site NR Peak (ms) Norm Peak (ms) P-T Amp (V) Norm P-T Amp  Left Sup Peroneal Anti Sensory (Ant Lat Mall)  34C  12 cm    2.4 <4.6 7.1 >3  Right Sup Peroneal Anti Sensory (Ant Lat Mall)  34C  12 cm    3.1 <4.6 5.2 >3  Left Sural Anti Sensory (Lat Mall)  34C  Calf    3.1 <4.6 5.6 >3  Right Sural Anti Sensory (Lat Mall)  34C  Calf    2.6 <4.6 8.3 >3   Motor Summary Table   Site NR Onset (ms) Norm Onset (ms) O-P Amp (mV) Norm O-P Amp Site1 Site2 Delta-0 (ms) Dist (cm) Vel (m/s) Norm Vel (m/s)  Left Peroneal Motor (Ext Dig Brev)  34C  Ankle    2.7 <6.0 2.6 >2.5 B Fib Ankle 7.8 33.0 42 >40  B Fib    10.5  2.1  Poplt B Fib 1.4 8.0 57 >40  Poplt    11.9  2.0         Right Peroneal Motor (Ext Dig  Brev)  34C  Ankle    4.0 <6.0 3.1 >2.5 B Fib Ankle 6.5 34.0 52 >40  B Fib    10.5  2.6  Poplt B Fib 1.5 9.0 60 >40  Poplt    12.0  2.5         Left Tibial Motor (Abd Hall Brev)  34C  Ankle    3.1 <6.0 8.3 >4 Knee Ankle 8.5 38.0 45 >40  Knee    11.6  6.1         Right Tibial Motor (Abd Hall Brev)  34C  Ankle    3.2 <6.0 6.0 >4 Knee Ankle 7.4 35.0 47 >40  Knee    10.6  5.2          H Reflex Studies   NR H-Lat (ms) Lat Norm (ms) L-R H-Lat (ms)  Left Tibial (Gastroc)  34C     33.47 <35 0.00  Right Tibial (Gastroc)  34C     33.47 <35 0.00   EMG   Side Muscle Ins Act Fibs Psw Fasc Number Recrt Dur Dur. Amp Amp. Poly Poly. Comment  Right AntTibialis Nml Nml Nml Nml Nml Nml Nml Nml Nml Nml Nml Nml N/A  Right Gastroc Nml Nml Nml Nml Nml Nml Nml Nml Nml Nml  Nml Nml N/A  Right Flex Dig Long Nml Nml Nml Nml Nml Nml Nml Nml Nml Nml Nml Nml N/A  Right RectFemoris Nml Nml Nml Nml Nml Nml Nml Nml Nml Nml Nml Nml N/A  Right GluteusMed Nml Nml Nml Nml Nml Nml Nml Nml Nml Nml Nml Nml N/A  Left AntTibialis Nml Nml Nml Nml Nml Nml Nml Nml Nml Nml Nml Nml N/A  Left Gastroc Nml Nml Nml Nml Nml Nml Nml Nml Nml Nml Nml Nml N/A  Left Flex Dig Long Nml Nml Nml Nml Nml Nml Nml Nml Nml Nml Nml Nml N/A  Left RectFemoris Nml Nml Nml Nml Nml Nml Nml Nml Nml Nml Nml Nml N/A  Left GluteusMed Nml Nml Nml Nml Nml Nml Nml Nml Nml Nml Nml Nml N/A      Waveforms:

## 2019-05-05 ENCOUNTER — Encounter: Payer: Self-pay | Admitting: Family Medicine

## 2019-05-07 ENCOUNTER — Other Ambulatory Visit: Payer: Self-pay | Admitting: Family Medicine

## 2019-05-07 DIAGNOSIS — B002 Herpesviral gingivostomatitis and pharyngotonsillitis: Secondary | ICD-10-CM

## 2019-05-09 ENCOUNTER — Other Ambulatory Visit: Payer: Self-pay | Admitting: Family Medicine

## 2019-05-13 ENCOUNTER — Other Ambulatory Visit: Payer: Self-pay | Admitting: Family Medicine

## 2019-05-13 DIAGNOSIS — M792 Neuralgia and neuritis, unspecified: Secondary | ICD-10-CM

## 2019-05-26 DIAGNOSIS — S82091A Other fracture of right patella, initial encounter for closed fracture: Secondary | ICD-10-CM

## 2019-05-26 HISTORY — DX: Other fracture of right patella, initial encounter for closed fracture: S82.091A

## 2019-06-04 ENCOUNTER — Other Ambulatory Visit: Payer: Self-pay

## 2019-06-04 ENCOUNTER — Other Ambulatory Visit: Payer: Self-pay | Admitting: Family Medicine

## 2019-06-04 DIAGNOSIS — M792 Neuralgia and neuritis, unspecified: Secondary | ICD-10-CM

## 2019-06-04 DIAGNOSIS — L709 Acne, unspecified: Secondary | ICD-10-CM

## 2019-06-04 NOTE — Telephone Encounter (Signed)
Last refill for lyrica 05/03/2019

## 2019-06-05 ENCOUNTER — Ambulatory Visit: Payer: Medicare Other | Admitting: Internal Medicine

## 2019-06-05 ENCOUNTER — Ambulatory Visit (HOSPITAL_BASED_OUTPATIENT_CLINIC_OR_DEPARTMENT_OTHER)
Admission: RE | Admit: 2019-06-05 | Discharge: 2019-06-05 | Disposition: A | Discharge status: Home / Self Care | Payer: Medicare Other | Source: Ambulatory Visit | Attending: Medical | Admitting: Medical

## 2019-06-05 ENCOUNTER — Ambulatory Visit (INDEPENDENT_AMBULATORY_CARE_PROVIDER_SITE_OTHER): Payer: Medicare Other | Admitting: Medical

## 2019-06-05 ENCOUNTER — Other Ambulatory Visit: Payer: Self-pay

## 2019-06-05 VITALS — BP 124/79 | HR 78 | Temp 96.8°F | Resp 12 | Ht 63.0 in | Wt 161.6 lb

## 2019-06-05 DIAGNOSIS — M25511 Pain in right shoulder: Secondary | ICD-10-CM | POA: Diagnosis not present

## 2019-06-05 DIAGNOSIS — M898X1 Other specified disorders of bone, shoulder: Secondary | ICD-10-CM

## 2019-06-05 DIAGNOSIS — S8991XA Unspecified injury of right lower leg, initial encounter: Secondary | ICD-10-CM | POA: Diagnosis not present

## 2019-06-05 DIAGNOSIS — S4991XA Unspecified injury of right shoulder and upper arm, initial encounter: Secondary | ICD-10-CM | POA: Diagnosis not present

## 2019-06-05 DIAGNOSIS — M25561 Pain in right knee: Secondary | ICD-10-CM | POA: Insufficient documentation

## 2019-06-05 DIAGNOSIS — I10 Essential (primary) hypertension: Secondary | ICD-10-CM | POA: Diagnosis not present

## 2019-06-05 LAB — COMPREHENSIVE METABOLIC PANEL
ALT: 19 U/L (ref 0–35)
AST: 28 U/L (ref 0–37)
Albumin: 3.9 g/dL (ref 3.5–5.2)
Alkaline Phosphatase: 77 U/L (ref 39–117)
BUN: 19 mg/dL (ref 6–23)
CO2: 28 mEq/L (ref 19–32)
Calcium: 9 mg/dL (ref 8.4–10.5)
Chloride: 103 mEq/L (ref 96–112)
Creatinine, Ser: 0.89 mg/dL (ref 0.40–1.20)
GFR: 61.94 mL/min (ref >60.00)
Glucose, Bld: 90 mg/dL (ref 70–99)
Potassium: 4.2 mEq/L (ref 3.5–5.1)
Sodium: 138 mEq/L (ref 135–145)
Total Bilirubin: 0.5 mg/dL (ref 0.2–1.2)
Total Protein: 6.4 g/dL (ref 6.0–8.3)

## 2019-06-05 LAB — CBC WITH DIFFERENTIAL/PLATELET
Basophils Absolute: 0.1 10*3/uL (ref 0.0–0.1)
Basophils Relative: 1.2 % (ref 0.0–3.0)
Eosinophils Absolute: 0.2 10*3/uL (ref 0.0–0.7)
Eosinophils Relative: 3.2 % (ref 0.0–5.0)
HCT: 39.8 % (ref 36.0–46.0)
Hemoglobin: 13.6 g/dL (ref 12.0–15.0)
Lymphocytes Relative: 20.2 % (ref 12.0–46.0)
Lymphs Abs: 1 10*3/uL (ref 0.7–4.0)
MCHC: 34.1 g/dL (ref 30.0–36.0)
MCV: 97.4 fl (ref 78.0–100.0)
Monocytes Absolute: 0.6 10*3/uL (ref 0.1–1.0)
Monocytes Relative: 11.8 % (ref 3.0–12.0)
Neutro Abs: 3.2 10*3/uL (ref 1.4–7.7)
Neutrophils Relative %: 63.6 % (ref 43.0–77.0)
Platelets: 160 10*3/uL (ref 150.0–400.0)
RBC: 4.08 Mil/uL (ref 3.87–5.11)
RDW: 12 % (ref 11.5–15.5)
WBC: 5.1 10*3/uL (ref 4.0–10.5)

## 2019-06-05 IMAGING — DX DG SHOULDER 2+V*R*
3 series · 3 of 3 positions shown · non-contrast
Comparison: None.

CLINICAL DATA: Acute right shoulder pain after injury five days
ago.

EXAM:
RIGHT SHOULDER - 2+ VIEW

[shoulder grashey]
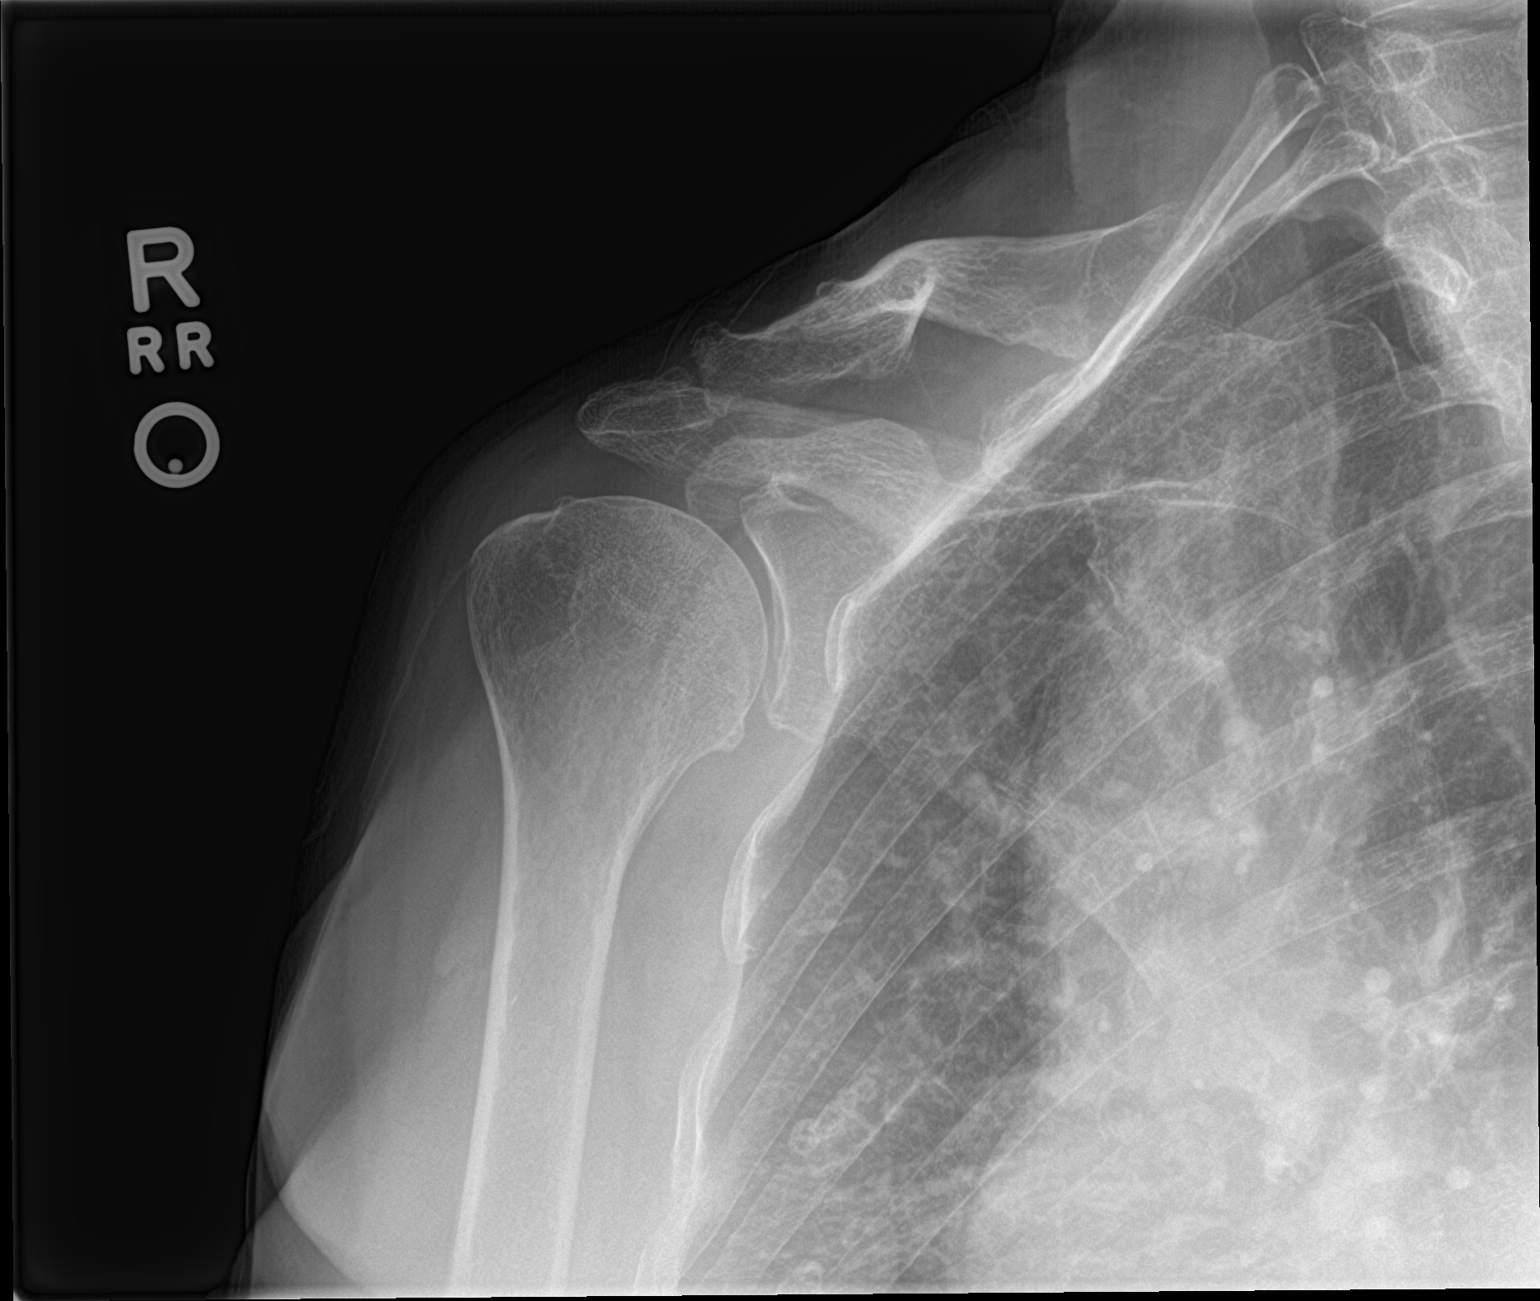

[shoulder y view]
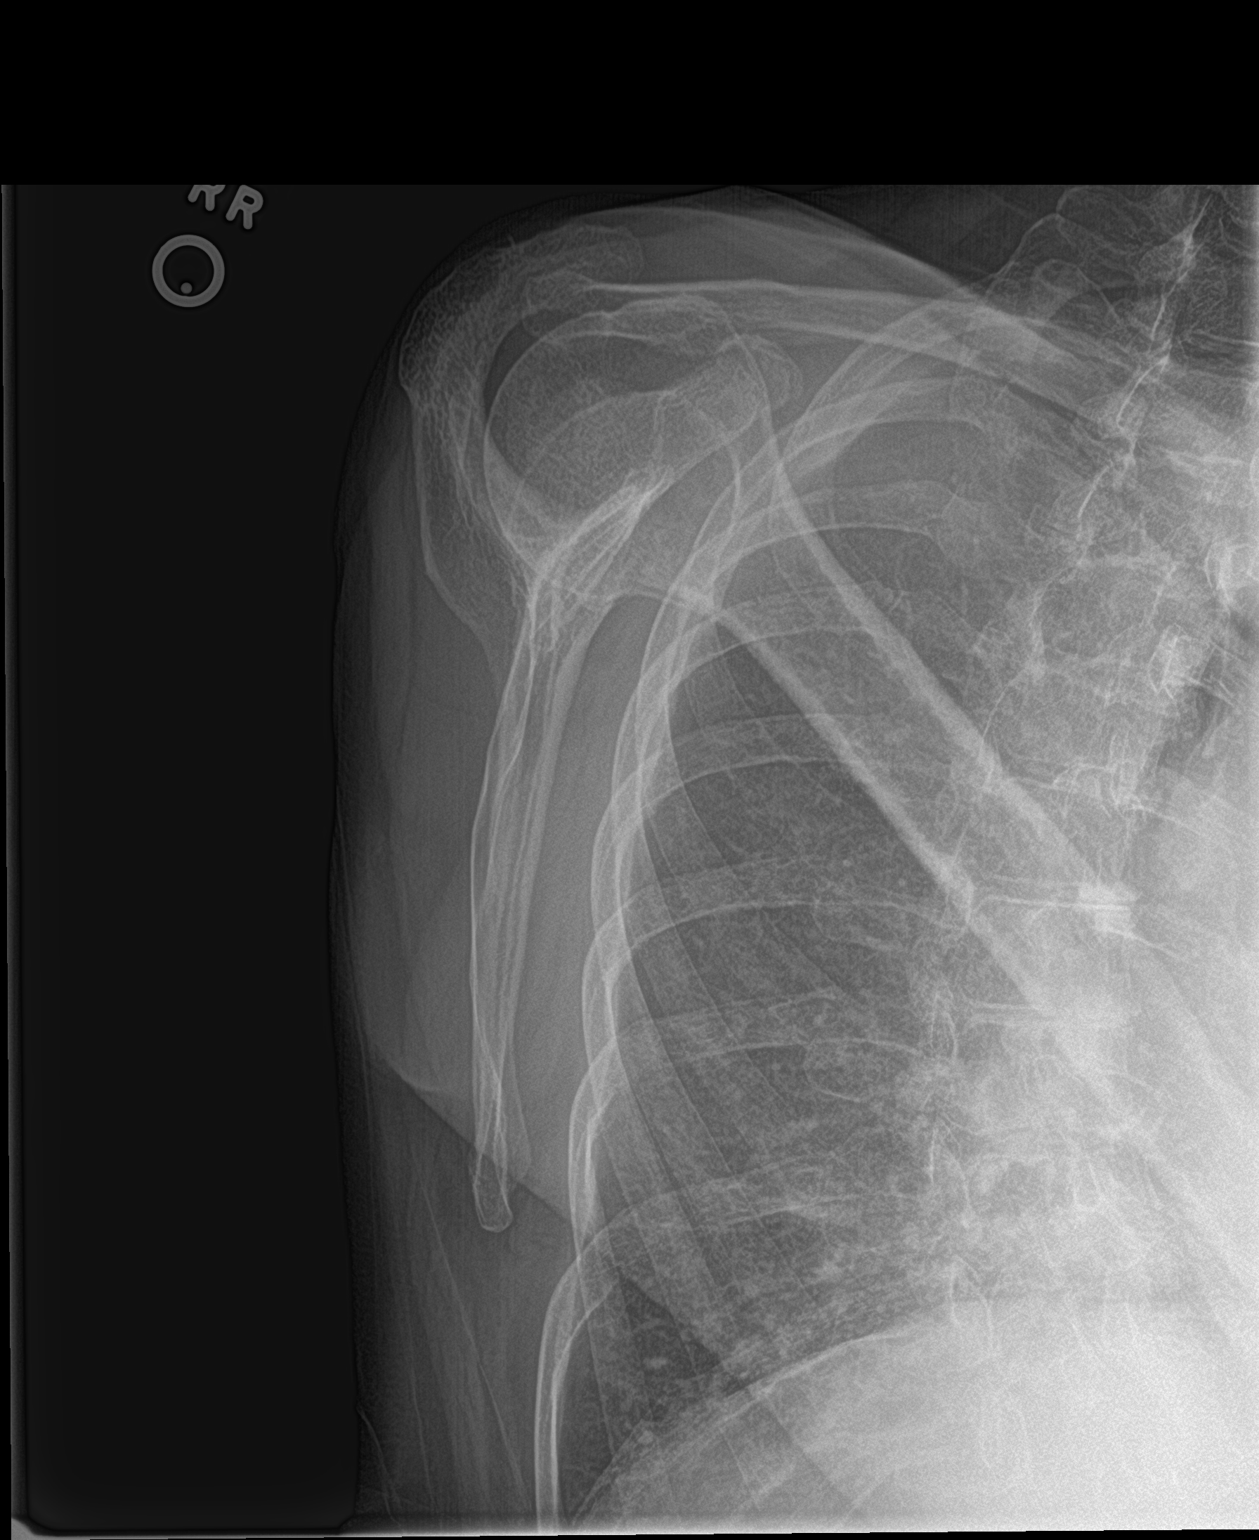

[shoulder axillary]
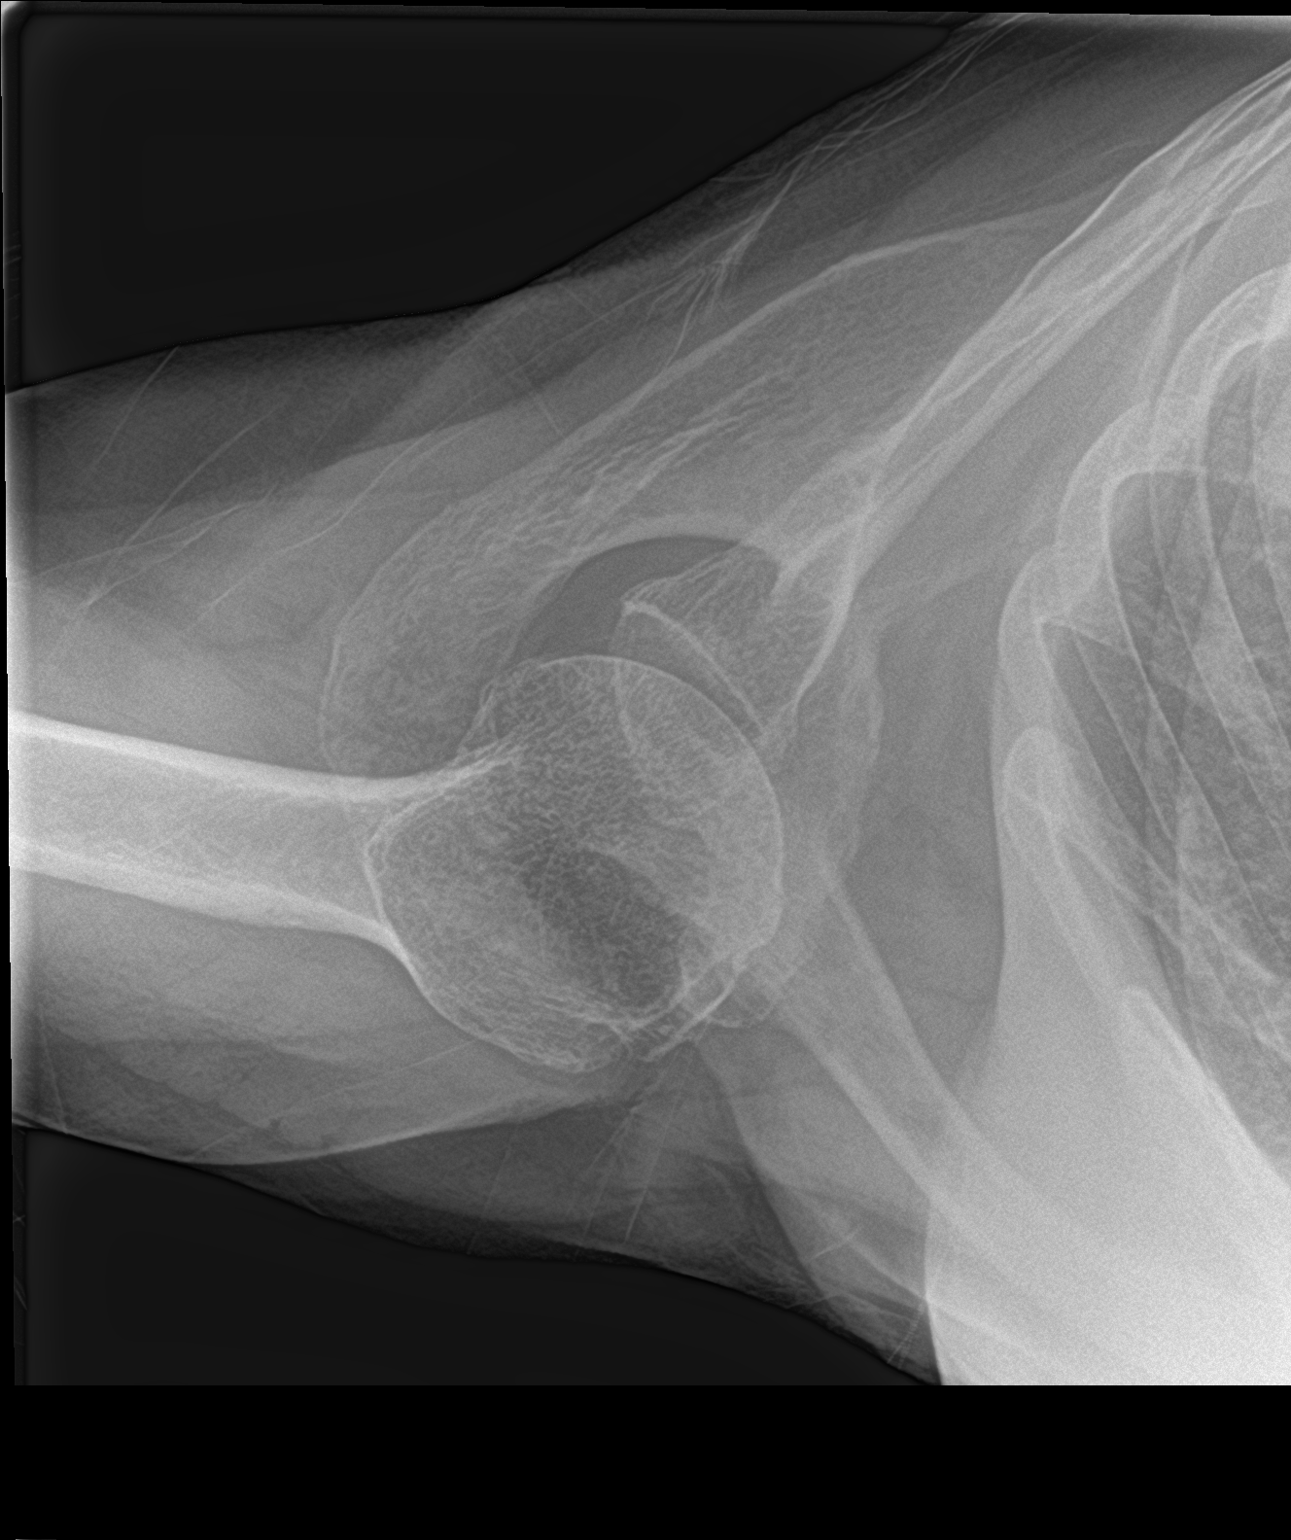

[3 of 3 positions shown; findings below may reference images not displayed]

FINDINGS: There is no evidence of fracture or dislocation. There is no
evidence of arthropathy or other focal bone abnormality. Soft
tissues are unremarkable.
IMPRESSION: Negative.

## 2019-06-05 IMAGING — DX DG SCAPULA*R*
2 series · 2 of 2 positions shown · non-contrast
Comparison: None.

CLINICAL DATA: Recent fall with right shoulder pain, initial
encounter

EXAM:
RIGHT SCAPULA - 2+ VIEWS

[scapula ap]
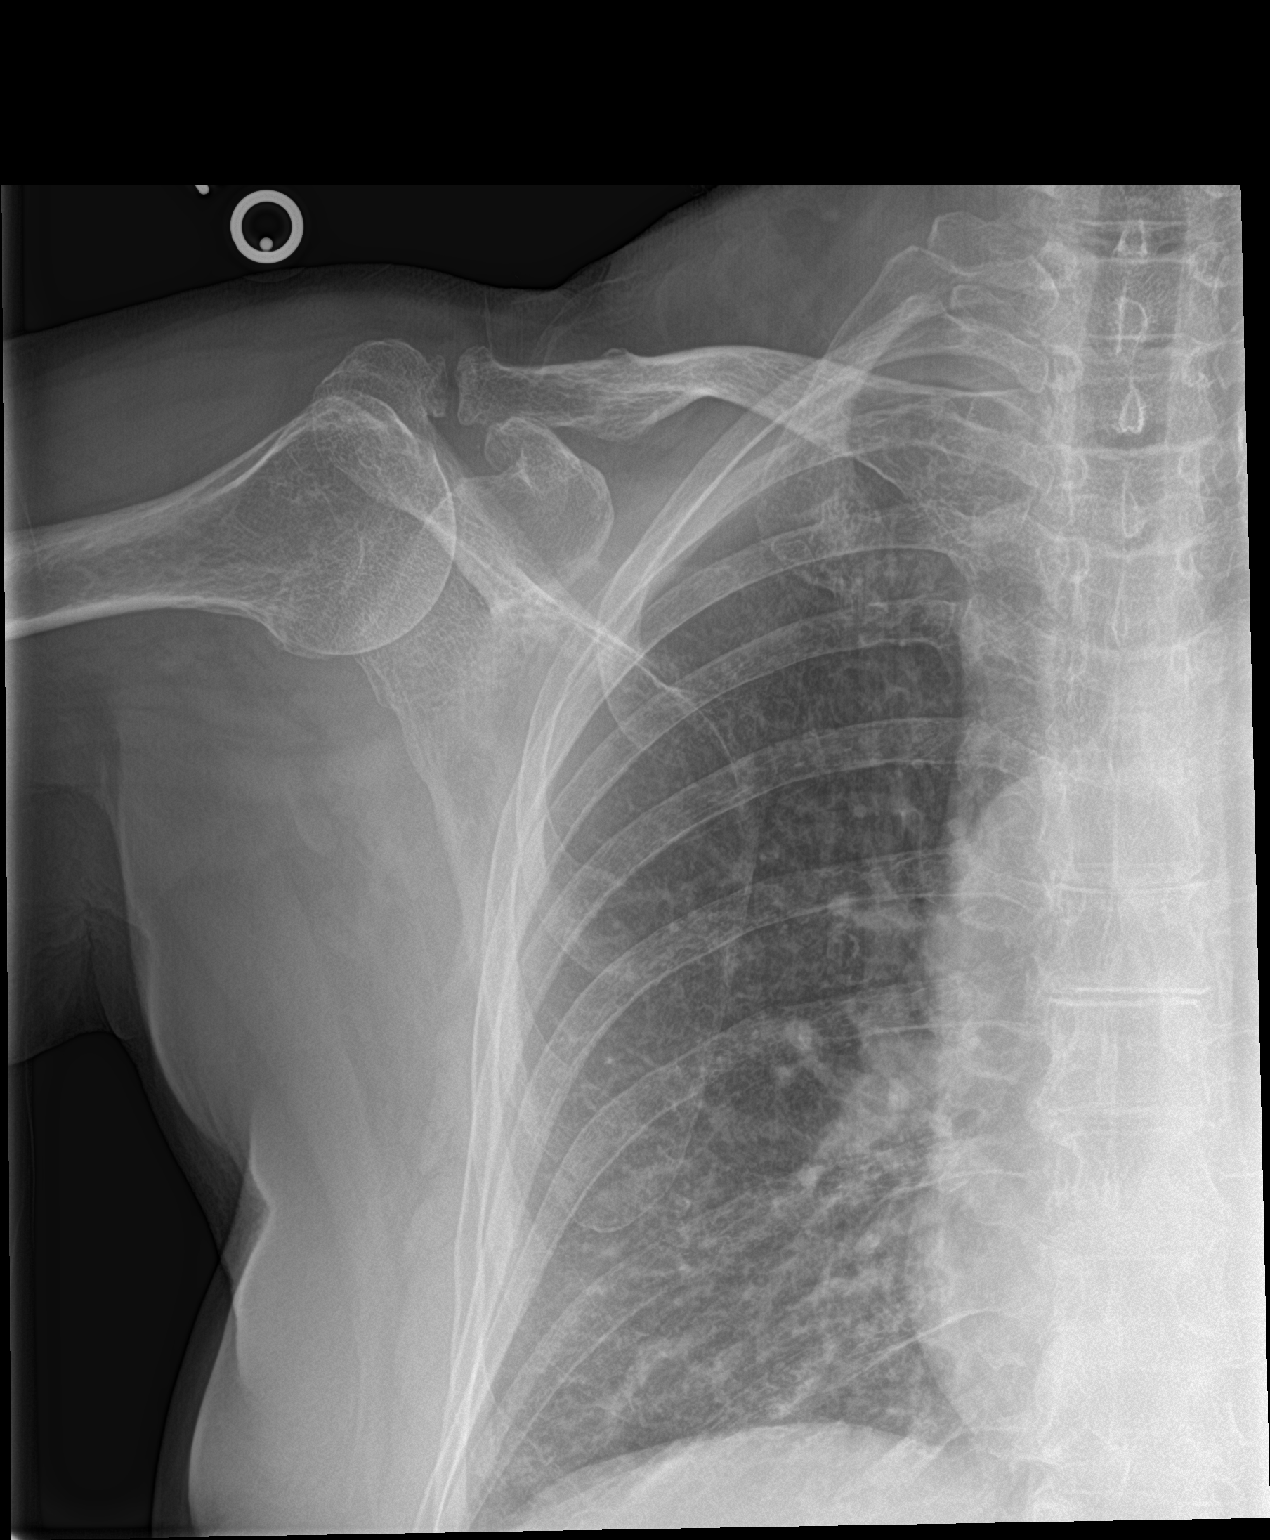

[shoulder y view]
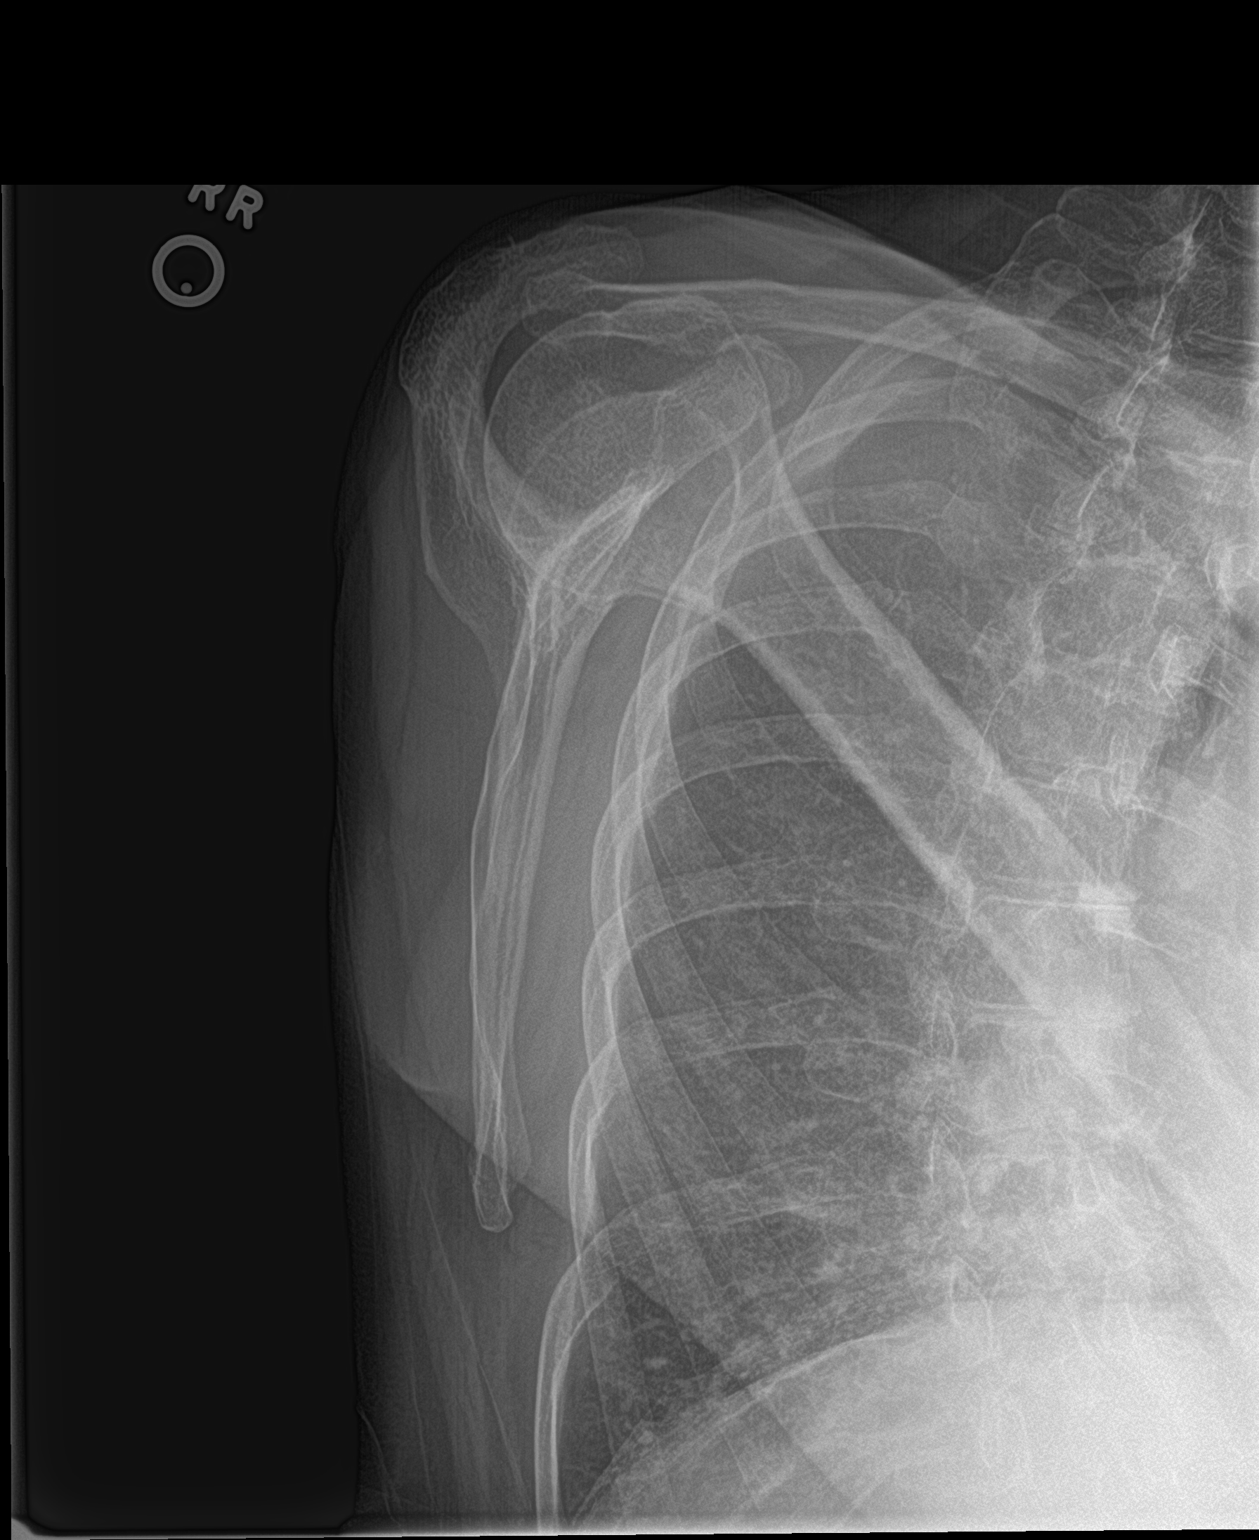

[2 of 2 positions shown; findings below may reference images not displayed]

FINDINGS: Degenerative changes of the acromioclavicular joint are seen. The
scapula appears intact. No soft tissue abnormality is seen. No
fracture or dislocation is noted.
IMPRESSION: No acute abnormality noted.

## 2019-06-05 IMAGING — DX DG KNEE 3 VIEWS*R*
3 series · 3 of 3 positions shown · non-contrast
Comparison: None.

CLINICAL DATA: Right knee pain after injury 5 days ago.

EXAM:
RIGHT KNEE - 3 VIEW

[knee ap]
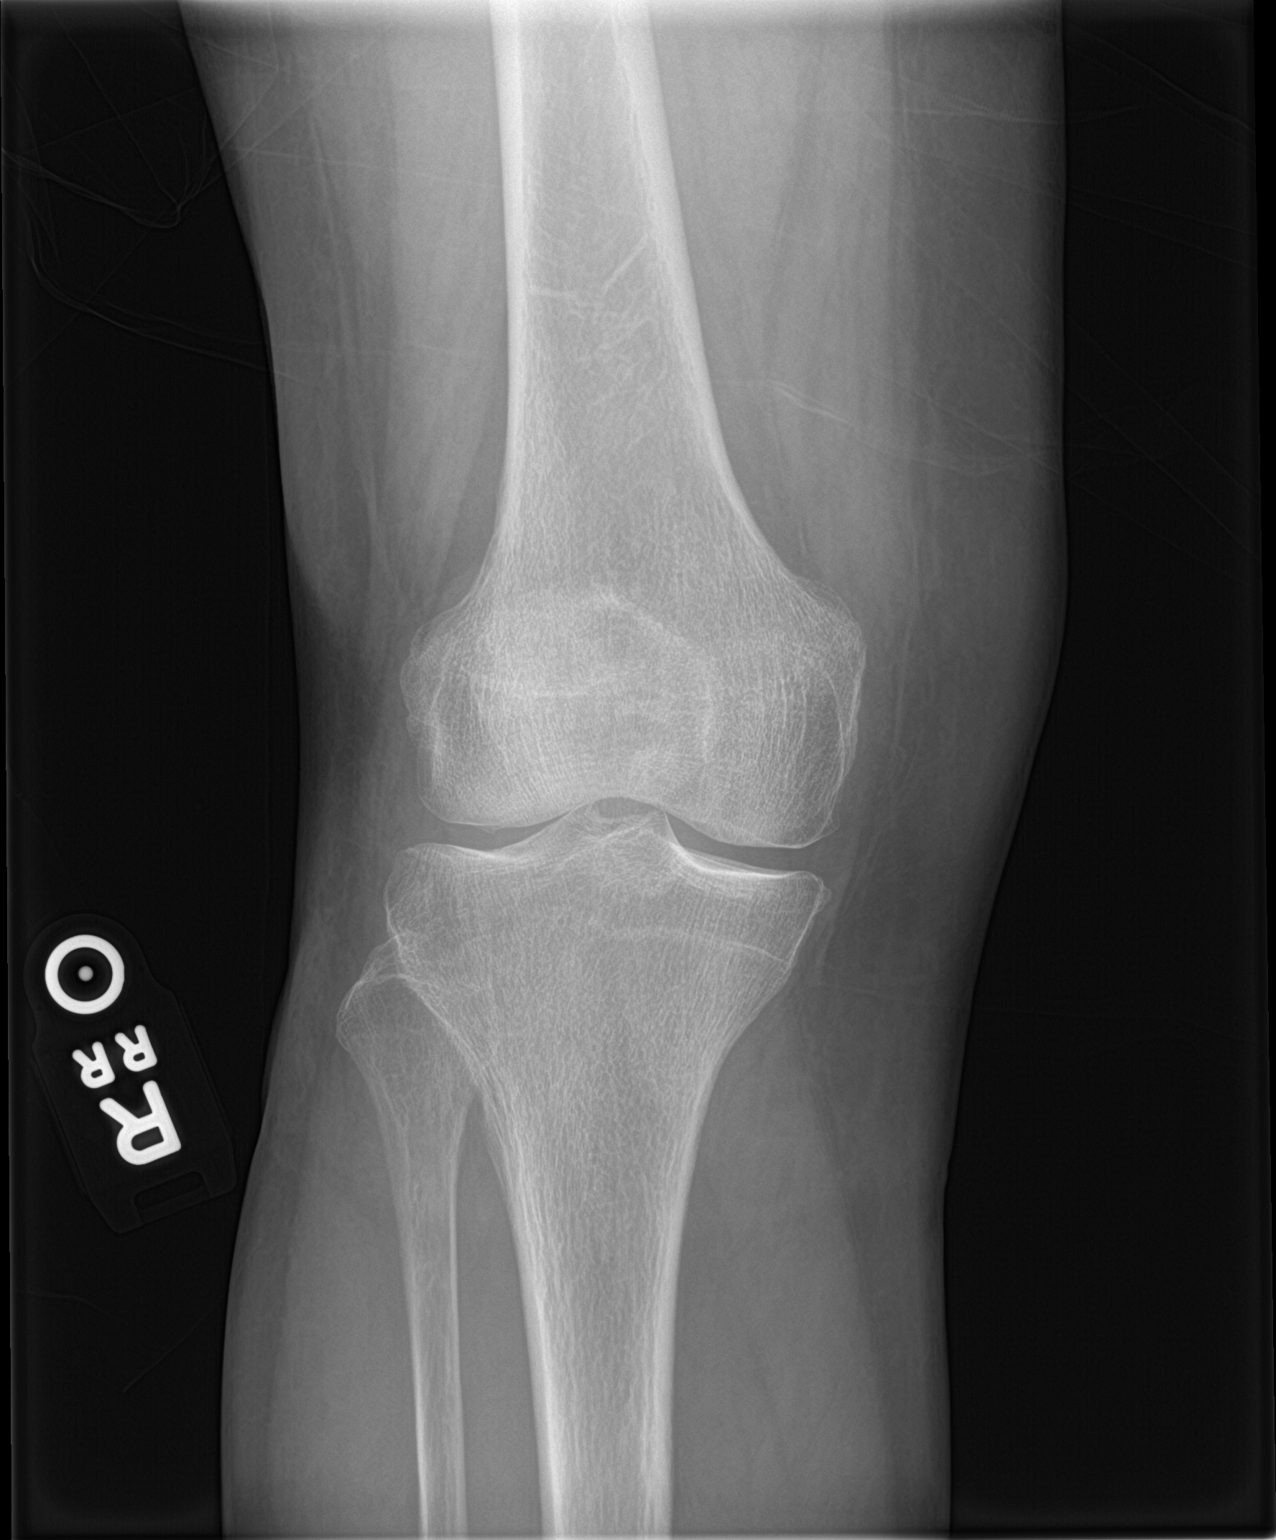

[knee lat]
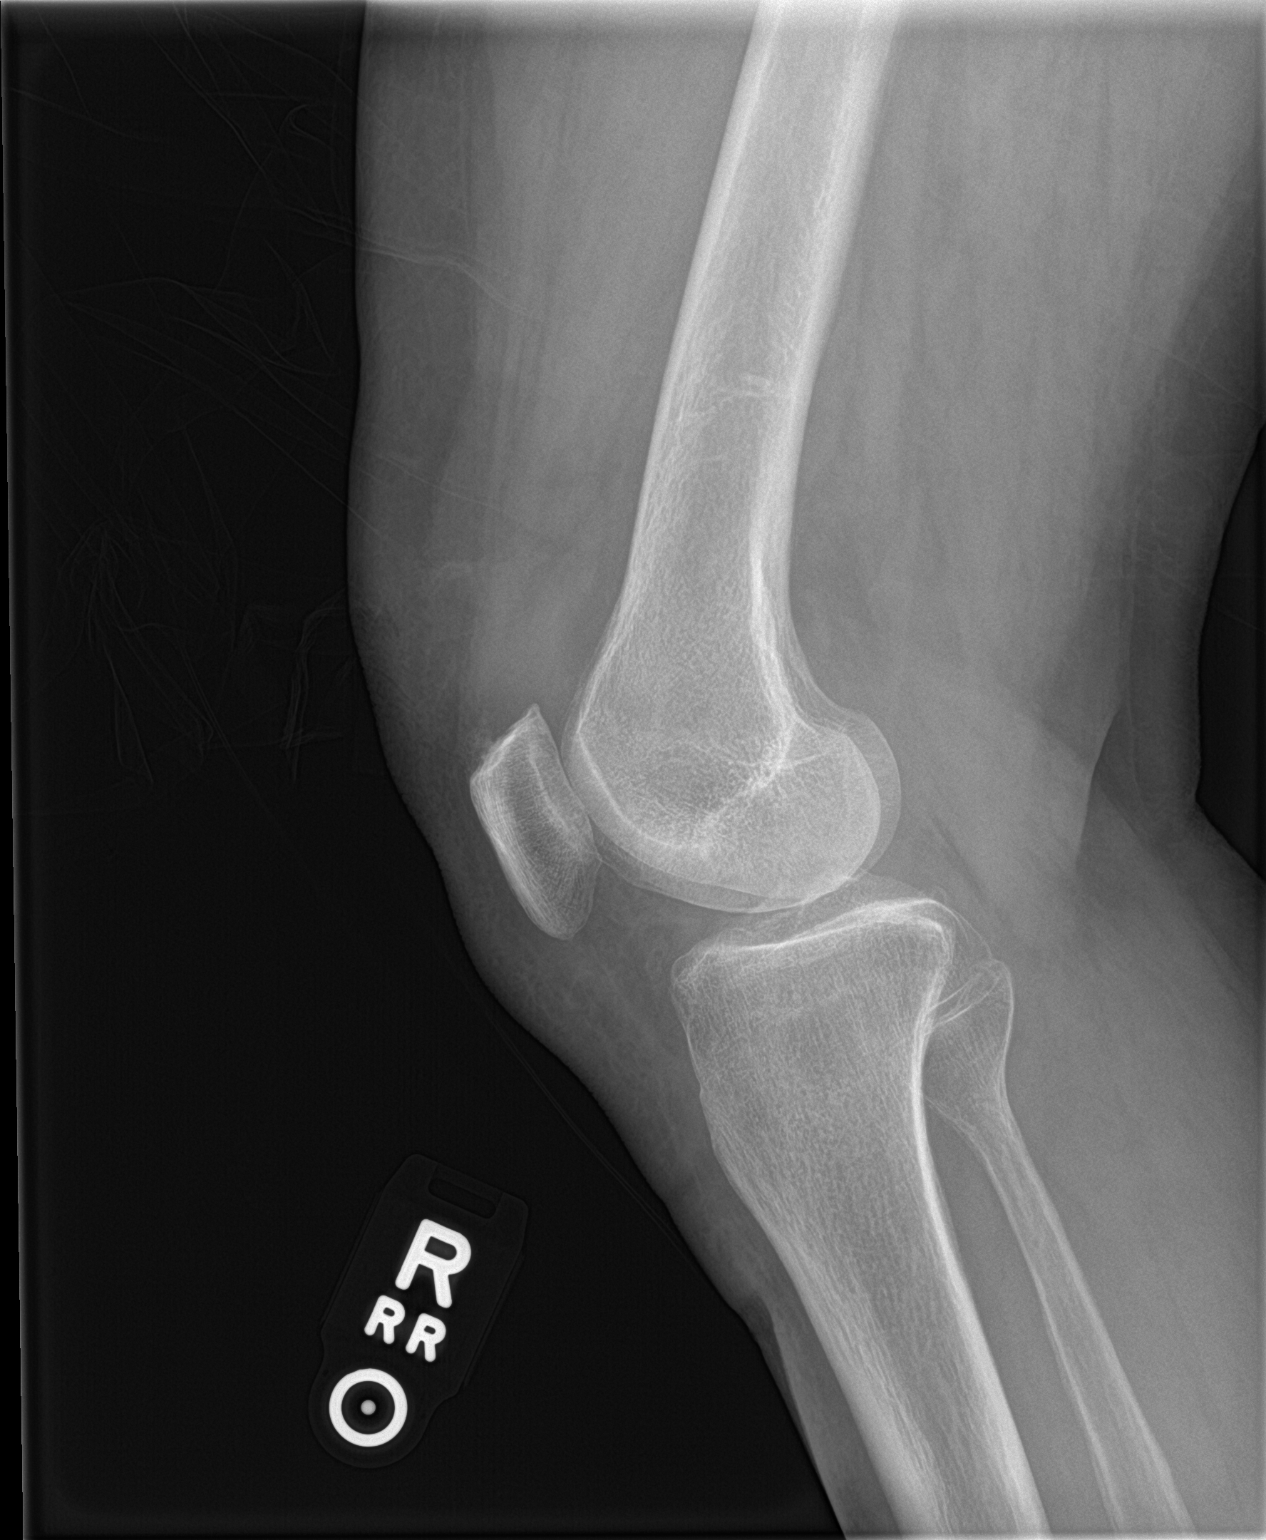

[knee sunrise]
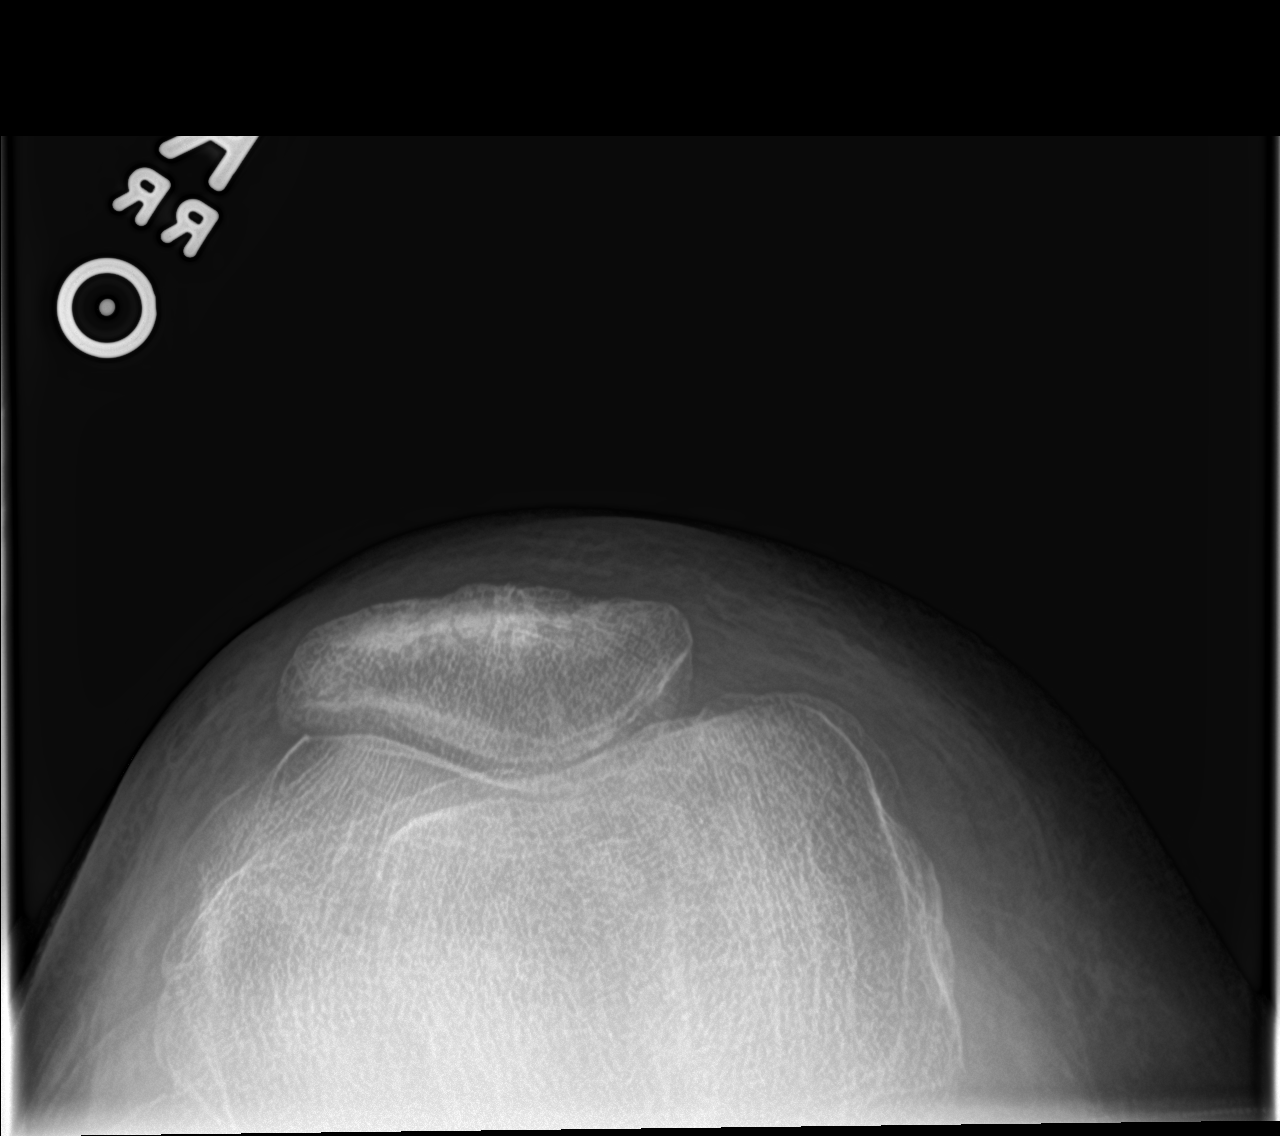

[3 of 3 positions shown; findings below may reference images not displayed]

FINDINGS: No evidence of fracture, dislocation, or joint effusion. No evidence
of arthropathy or other focal bone abnormality. Soft tissues are
unremarkable.
IMPRESSION: Negative.

## 2019-06-05 NOTE — Progress Notes (Signed)
Subjective:    Patient ID: Carolyn Mckinney, female    DOB: 12/24/1945, 74 y.o.   MRN: [id number]  HPI  Pt in with some knee pain. Pt states family dog ran toward her and hit her in rt knee on Saturday. She fell backwards onto carpet.   Pain first day knee pain only. Next day when she woke had some neck pain, trapezius  She fell on her back that day. No loc.  Most of pain in knee area. But some rt shoulder and scapula area pain.  Low dose ibupfren  Review of Systems  Constitutional: Negative for chills, diaphoresis and fever.  Respiratory: Negative for cough, choking, shortness of breath and wheezing.   Cardiovascular: Negative for chest pain and palpitations.  Gastrointestinal: Negative for abdominal pain.  Musculoskeletal:       See HPI  Neurological: Negative for dizziness, syncope, weakness, numbness and headaches.    Past Medical History:  Diagnosis Date  . Allergy   . Cancer (HCC)    Breast  . Hypertension   . Meniere's disease      Social History   Socioeconomic History  . Marital status: Widowed    Spouse name: Not on file  . Number of children: Not on file  . Years of education: Not on file  . Highest education level: Not on file  Occupational History  . Not on file  Tobacco Use  . Smoking status: Never Smoker  . Smokeless tobacco: Never Used  Substance and Sexual Activity  . Alcohol use: Yes    Comment: just wine  . Drug use: Never  . Sexual activity: Not on file  Other Topics Concern  . Not on file  Social History Narrative  . Not on file   Social Determinants of Health   Financial Resource Strain:   . Difficulty of Paying Living Expenses: Not on file  Food Insecurity:   . Worried About Charity fundraiser in the Last Year: Not on file  . Ran Out of Food in the Last Year: Not on file  Transportation Needs:   . Lack of Transportation (Medical): Not on file  . Lack of Transportation (Non-Medical): Not on file  Physical Activity:   . Days  of Exercise per Week: Not on file  . Minutes of Exercise per Session: Not on file  Stress:   . Feeling of Stress : Not on file  Social Connections:   . Frequency of Communication with Friends and Family: Not on file  . Frequency of Social Gatherings with Friends and Family: Not on file  . Attends Religious Services: Not on file  . Active Member of Clubs or Organizations: Not on file  . Attends Archivist Meetings: Not on file  . Marital Status: Not on file  Intimate Partner Violence:   . Fear of Current or Ex-Partner: Not on file  . Emotionally Abused: Not on file  . Physically Abused: Not on file  . Sexually Abused: Not on file    Past Surgical History:  Procedure Laterality Date  . ABDOMINAL HYSTERECTOMY    . BREAST LUMPECTOMY      Family History  Problem Relation Age of Onset  . Arthritis Mother   . Heart disease Father     No Known Allergies  Current Outpatient Medications on File Prior to Visit  Medication Sig Dispense Refill  . amLODipine (NORVASC) 5 MG tablet Take 1 tablet by mouth once daily 90 tablet 0  . benzoyl peroxide  5 % gel Apply topically daily. 45 g 0  . losartan (COZAAR) 100 MG tablet Take 1 tablet by mouth once daily 90 tablet 0  . minocycline (MINOCIN) 100 MG capsule Take 1 capsule by mouth twice daily 60 capsule 0  . pregabalin (LYRICA) 150 MG capsule Take 1 capsule by mouth twice daily 60 capsule 0  . traZODone (DESYREL) 50 MG tablet Take 1-2 tablets (50-100 mg total) by mouth at bedtime as needed for sleep. 60 tablet 3  . triamterene-hydrochlorothiazide (MAXZIDE-25) 37.5-25 MG tablet Take 1 tablet by mouth daily. 30 tablet 3  . valACYclovir (VALTREX) 500 MG tablet TAKE 4 TABLETS BY MOUTH AT ONSET OF A BREAKOUT AND REPEAT IN 12 HOURS. TAKE 1 TABLET BY MOUTH DAILY. 90 tablet 0  . valsartan (DIOVAN) 320 MG tablet Take 1 tablet (320 mg total) by mouth daily. 90 tablet 1   No current facility-administered medications on file prior to visit.     BP 124/79 (BP Location: Left Arm, Cuff Size: Normal)   Pulse 78   Temp (!) 96.8 F (36 C) (Temporal)   Resp 12   Ht 5\' 3"  (1.6 m)   Wt 161 lb 9.6 oz (73.3 kg)   SpO2 100%   BMI 28.63 kg/m       Objective:   Physical Exam   General - no acute distress. Heart-rrr Lungs-cta Neck-no  mid c-spine tender  Lower back- none tender lspine.  Rt rib- no  Pain on palpation. Rt hip pain no  direct palpation pain Rt knee- tibial plateau area pain. Mild swelling to knee. No bruise. On rom has pain and mild decreased. No crepitus. Rt shoulder- pain on rom and palpation. Rt scapula- mild tender to palpation.    Assessment & Plan:  For rt knee pain, rt shoulder pain, rt scapula pain will get xray today. Can use ibuprofen 200-400 mg every 8 hours for pain.  If xray of knee shows fx refer to sport medicine or orthopedist.  If no fracture but pain in knee persists may refer early next week as well to evaluate meniscus./ligaments.  I don't think spine, ribs or hip need xrays based on exam but if these areas begin to hurt will get xray.  Follow up 7-10 days or as needed  30 minutes spent with patient today.  50% of time spent counseling patient on plan going forward.  Mackie Pai, PA-C

## 2019-06-05 NOTE — Patient Instructions (Signed)
For rt knee pain, rt shoulder pain, rt scapula pain will get xray today. Can use ibuprofen 200-400 mg every 8 hours for pain.  If xray of knee shows fx refer to sport medicine or orthopedist.  If no fracture but pain in knee persists may refer early next week as well to evaluate meniscus./ligaments.  I don't think spine, ribs or hip need xrays based on exam but if these areas begin to hurt will get xray.  Follow up 7-10 days or as needed

## 2019-06-10 ENCOUNTER — Encounter: Payer: Self-pay | Admitting: Family Medicine

## 2019-06-10 ENCOUNTER — Telehealth: Payer: Self-pay | Admitting: Medical

## 2019-06-10 DIAGNOSIS — M25561 Pain in right knee: Secondary | ICD-10-CM

## 2019-06-10 NOTE — Telephone Encounter (Signed)
Referral to sports me placed.

## 2019-06-11 ENCOUNTER — Ambulatory Visit: Payer: Self-pay

## 2019-06-11 ENCOUNTER — Other Ambulatory Visit: Payer: Self-pay

## 2019-06-11 ENCOUNTER — Encounter: Payer: Self-pay | Admitting: Family Medicine

## 2019-06-11 ENCOUNTER — Ambulatory Visit (INDEPENDENT_AMBULATORY_CARE_PROVIDER_SITE_OTHER): Payer: Medicare Other | Admitting: Family Medicine

## 2019-06-11 VITALS — BP 144/93 | HR 80 | Ht 63.0 in | Wt 149.0 lb

## 2019-06-11 DIAGNOSIS — S82141A Displaced bicondylar fracture of right tibia, initial encounter for closed fracture: Secondary | ICD-10-CM | POA: Insufficient documentation

## 2019-06-11 DIAGNOSIS — M25561 Pain in right knee: Secondary | ICD-10-CM

## 2019-06-11 NOTE — Progress Notes (Signed)
Carolyn Mckinney - 74 y.o. female MRN [id number]  Date of birth: Jul 27, 1945  SUBJECTIVE:  Including CC & ROS.  Chief Complaint  Patient presents with  . Knee Pain    right knee x 05/31/2019    Carolyn Mckinney is a 74 y.o. female that is presenting with acute right knee pain.  The pain is occurring over the lateral tibia.  The pain occurred after she was hit in the leg by the dog and fell backwards.  The pain is been severe at times.  Is been ongoing.  She has not had any improvement with modalities to date.  She has been using a cane to help with ambulation.  Symptoms seem to be worse with absolutely any time she is ambulating.  No history of similar symptoms.   Review of Systems See HPI   HISTORY: Past Medical, Surgical, Social, and Family History Reviewed & Updated per EMR.   Pertinent Historical Findings include:  Past Medical History:  Diagnosis Date  . Allergy   . Cancer (HCC)    Breast  . Hypertension   . Meniere's disease     Past Surgical History:  Procedure Laterality Date  . ABDOMINAL HYSTERECTOMY    . BREAST LUMPECTOMY      Family History  Problem Relation Age of Onset  . Arthritis Mother   . Heart disease Father     Social History   Socioeconomic History  . Marital status: Widowed    Spouse name: Not on file  . Number of children: Not on file  . Years of education: Not on file  . Highest education level: Not on file  Occupational History  . Not on file  Tobacco Use  . Smoking status: Never Smoker  . Smokeless tobacco: Never Used  Substance and Sexual Activity  . Alcohol use: Yes    Comment: just wine  . Drug use: Never  . Sexual activity: Not on file  Other Topics Concern  . Not on file  Social History Narrative  . Not on file   Social Determinants of Health   Financial Resource Strain:   . Difficulty of Paying Living Expenses: Not on file  Food Insecurity:   . Worried About Charity fundraiser in the Last Year: Not on file  . Ran Out of  Food in the Last Year: Not on file  Transportation Needs:   . Lack of Transportation (Medical): Not on file  . Lack of Transportation (Non-Medical): Not on file  Physical Activity:   . Days of Exercise per Week: Not on file  . Minutes of Exercise per Session: Not on file  Stress:   . Feeling of Stress : Not on file  Social Connections:   . Frequency of Communication with Friends and Family: Not on file  . Frequency of Social Gatherings with Friends and Family: Not on file  . Attends Religious Services: Not on file  . Active Member of Clubs or Organizations: Not on file  . Attends Archivist Meetings: Not on file  . Marital Status: Not on file  Intimate Partner Violence:   . Fear of Current or Ex-Partner: Not on file  . Emotionally Abused: Not on file  . Physically Abused: Not on file  . Sexually Abused: Not on file     PHYSICAL EXAM:  VS: BP (!) 144/93   Pulse 80   Ht 5\' 3"  (1.6 m)   Wt 149 lb (67.6 kg)   BMI 26.39 kg/m  Physical  Exam Gen: NAD, alert, cooperative with exam, well-appearing MSK:  Right knee: No obvious effusion. Tenderness palpation over the proximal tibia. Ecchymosis occurring over the lateral lower leg. Normal flexion extension. No instability. Normal strength resistance. Negative Murray's test. Neurovascular intact  Limited ultrasound: Right knee:  Mild effusion. Significant hyperemia of the lateral tibial plateau to suggest a fracture. Normal-appearing proximal fibula  Summary: Findings would suggest a nondisplaced tibial plateau fracture  Ultrasound and interpretation by Clearance Coots, MD    ASSESSMENT & PLAN:   Closed fracture of right tibial plateau It appears that the tibial plateau has a fracture with a significant hyperemia observed on ultrasound. -Hinged knee brace. -Rolling walker. -MRI to evaluate for tibial plateau fracture. -Counseled on supportive care and nonweightbearing.

## 2019-06-11 NOTE — Patient Instructions (Signed)
Nice to meet you Please try to avoid weight bearing on the right  Please use the brace Please try the rolling walker  Please try tylenol and ice   Please send me a message in [place] with any questions or updates.  Please see me back in 1 week.   --Dr. Raeford Razor

## 2019-06-11 NOTE — Assessment & Plan Note (Signed)
It appears that the tibial plateau has a fracture with a significant hyperemia observed on ultrasound. -Hinged knee brace. -Rolling walker. -MRI to evaluate for tibial plateau fracture. -Counseled on supportive care and nonweightbearing.

## 2019-06-14 ENCOUNTER — Ambulatory Visit
Admission: RE | Admit: 2019-06-14 | Discharge: 2019-06-14 | Disposition: A | Discharge status: Home / Self Care | Payer: Medicare Other | Source: Ambulatory Visit | Attending: Family Medicine | Admitting: Family Medicine

## 2019-06-14 ENCOUNTER — Other Ambulatory Visit: Payer: Self-pay

## 2019-06-14 DIAGNOSIS — S82141A Displaced bicondylar fracture of right tibia, initial encounter for closed fracture: Secondary | ICD-10-CM

## 2019-06-14 DIAGNOSIS — M25561 Pain in right knee: Secondary | ICD-10-CM | POA: Diagnosis not present

## 2019-06-14 IMAGING — MR MR KNEE*R* W/O CM
6 series · 38 of 40 positions shown · non-contrast
Comparison: Plain films right knee [DATE].

CLINICAL DATA: Right knee pain since a fall [DATE] when the
patient's dog ran into her leg. Subsequent encounter.

EXAM:
MRI OF THE RIGHT KNEE WITHOUT CONTRAST
TECHNIQUE: Multiplanar, multisequence MR imaging of the knee was performed. No
intravenous contrast was administered.

[Series 7: T2 fat-sat · axial · right · 4.0mm · 0.50mm/px · z∈[-62,+92]mm · 9 of 36 slices shown (1 of 3)]
[im 1/36]
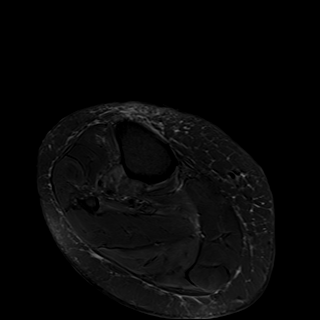
[im 5/36]
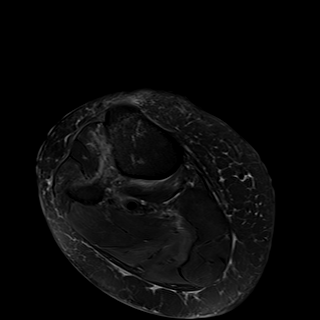
[im 9/36]
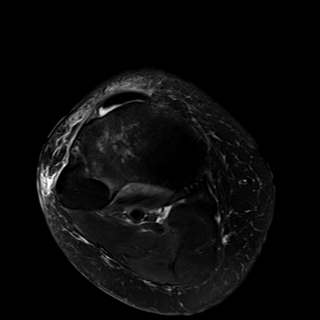
[im 14/36]
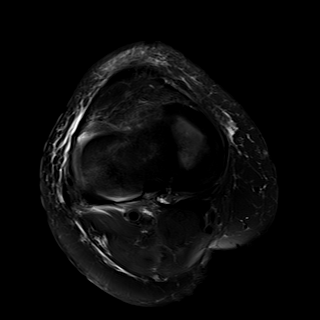
[im 18/36]
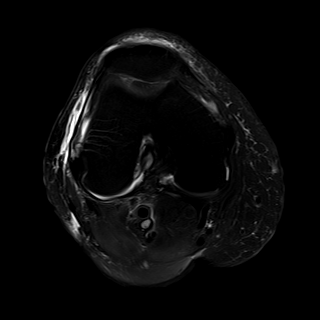
[im 22/36]
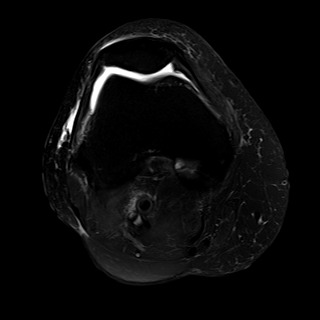
[im 27/36]
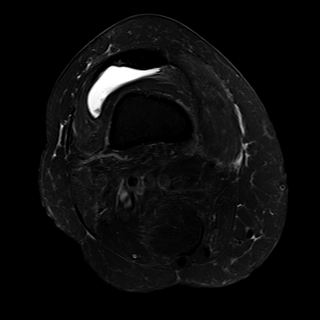
[im 31/36]
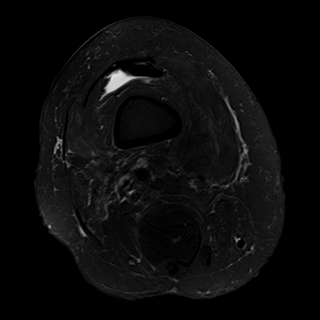
[im 36/36]
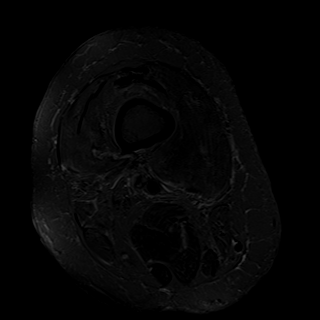

[Series 8: T2 fat-sat · coronal · right · 4.0mm · 0.39mm/px · 6 of 28 slices shown (2 of 3)]
[im 1/28]
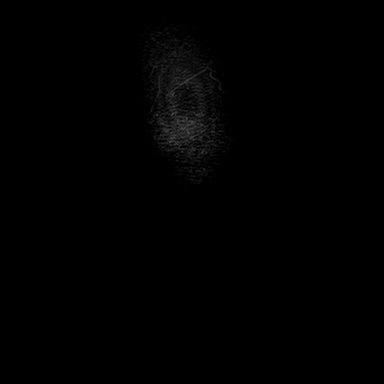
[im 6/28]
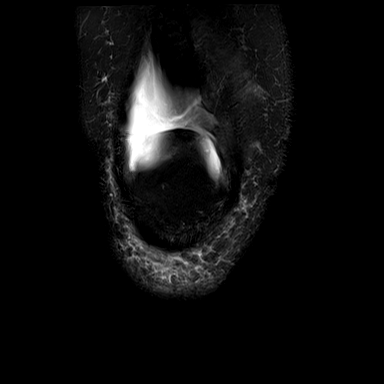
[im 11/28]
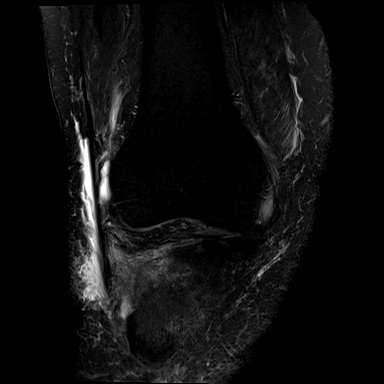
[im 17/28]
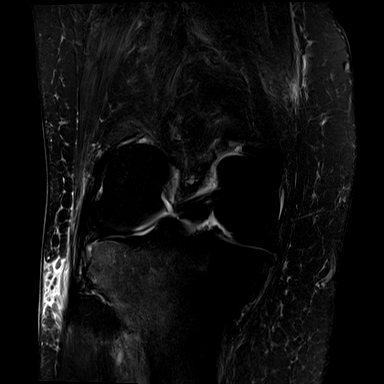
[im 22/28]
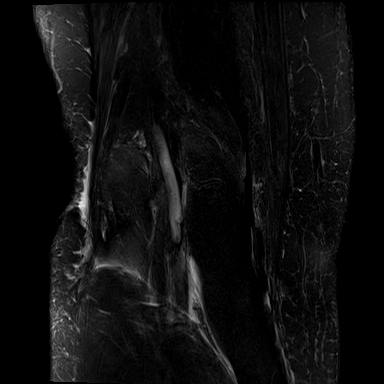
[im 28/28]
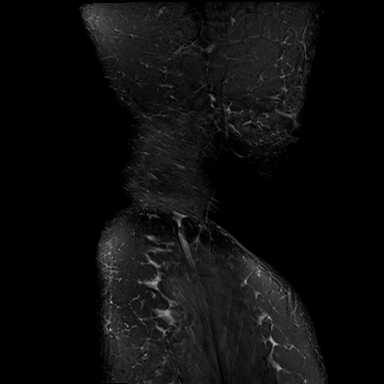

[Series 9: T1 · coronal · right · 4.0mm · 0.39mm/px · 4 of 28 slices shown]
[im 1/28]
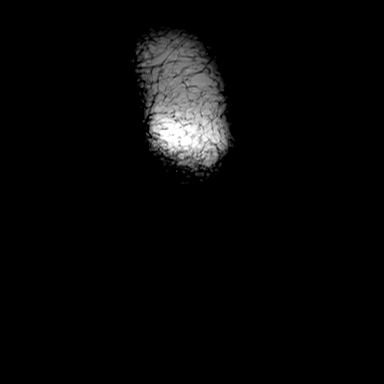
[im 6/28]
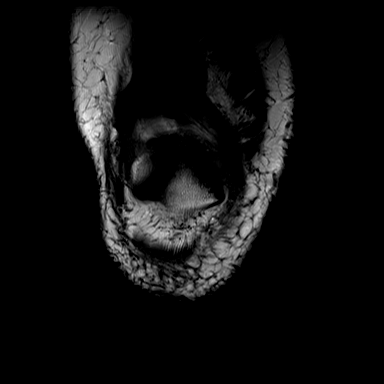
[im 11/28]
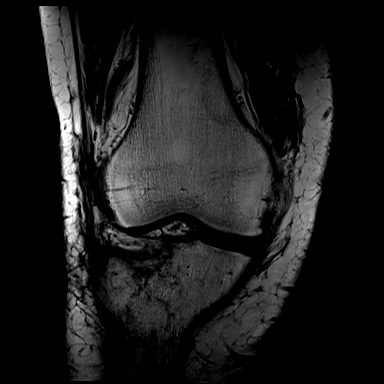
[im 17/28]
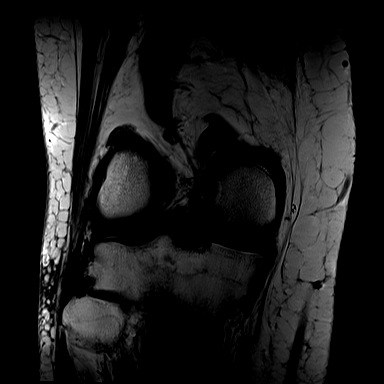

[Series 10: PD fat-sat · coronal · right · 3.0mm · 0.47mm/px · 7 of 31 slices shown (1 of 2)]
[im 1/31]
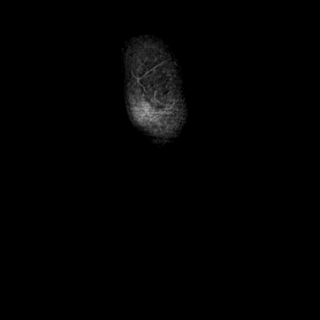
[im 6/31]
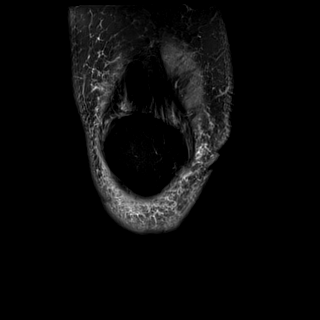
[im 11/31]
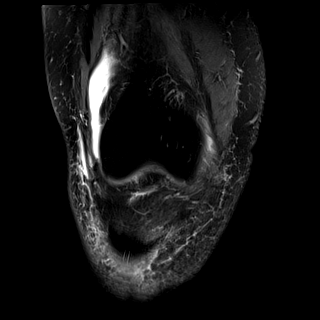
[im 16/31]
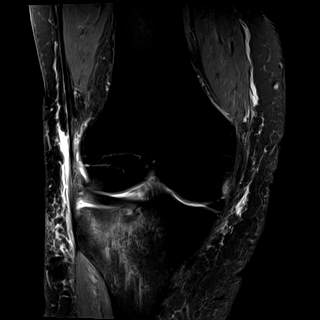
[im 21/31]
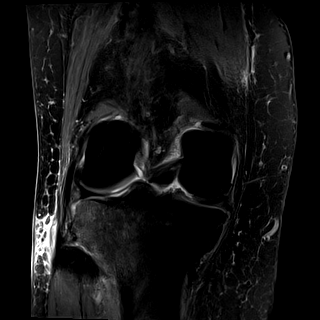
[im 26/31]
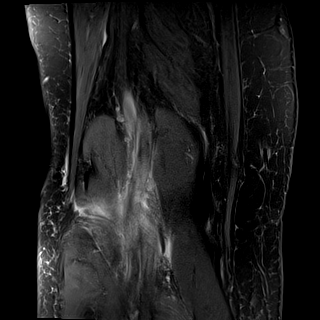
[im 31/31]
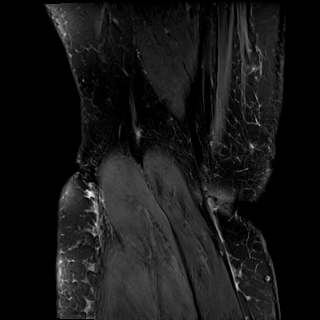

[Series 11: PD fat-sat · sagittal · right · 3.0mm · 0.39mm/px · 6 of 27 slices shown (2 of 2)]
[im 1/27]
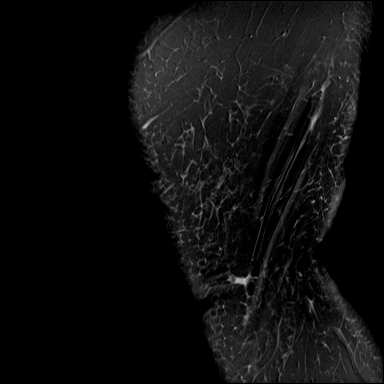
[im 6/27]
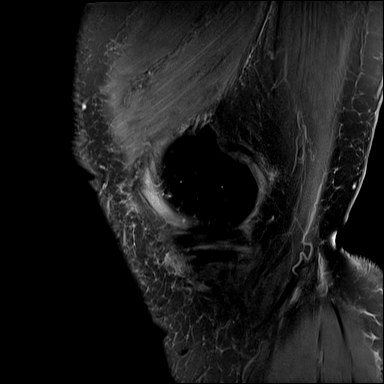
[im 11/27]
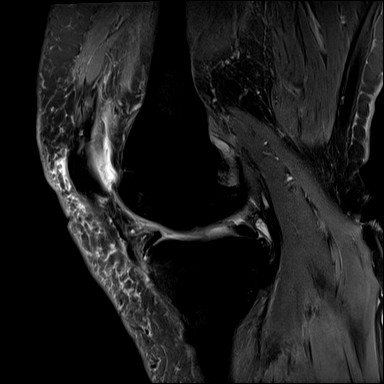
[im 16/27]
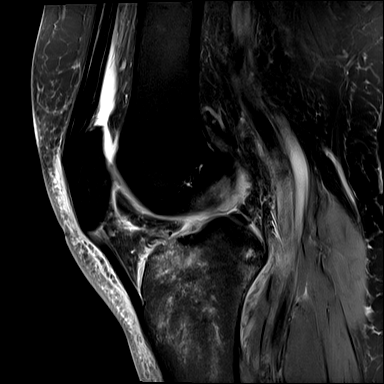
[im 21/27]
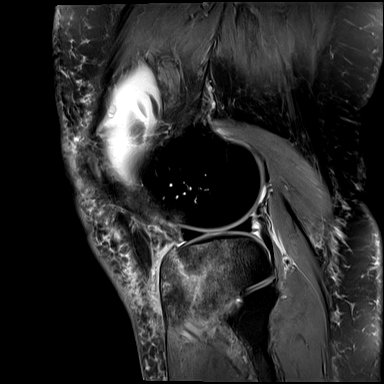
[im 27/27]
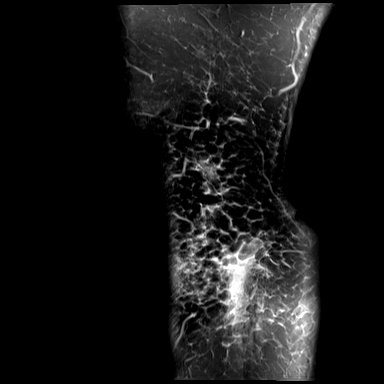

[Series 12: T2 fat-sat · sagittal · right · 3.0mm · 0.39mm/px · 6 of 27 slices shown (3 of 3)]
[im 1/27]
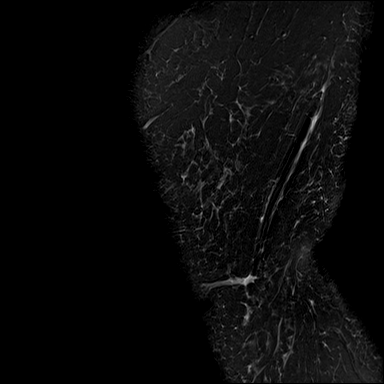
[im 6/27]
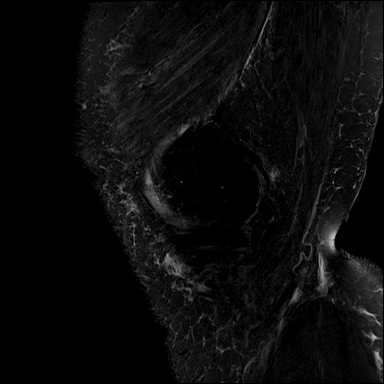
[im 11/27]
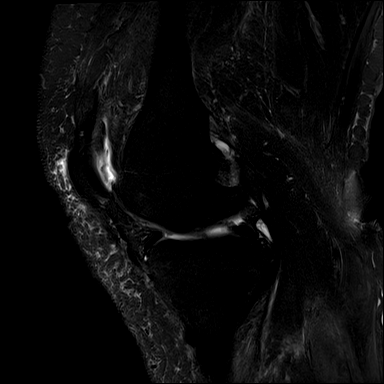
[im 16/27]
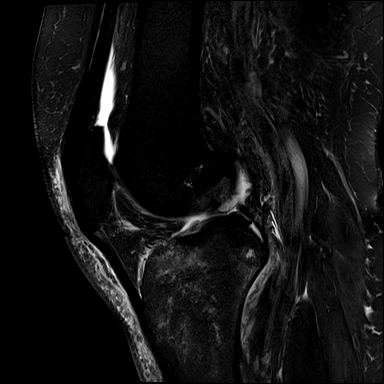
[im 21/27]
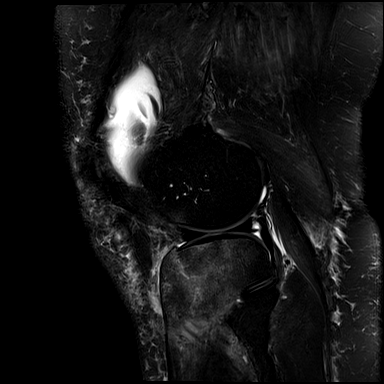
[im 27/27]
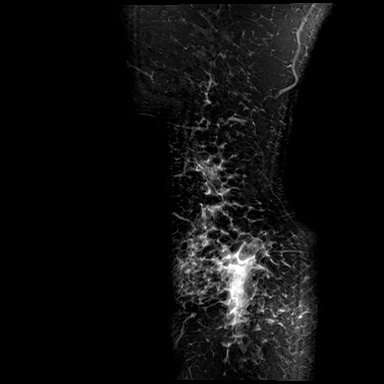

[38 of 40 positions shown; findings below may reference images not displayed]

FINDINGS: MENISCI

Medial meniscus:  Intact.

Lateral meniscus: There is some fraying along the free edge of the
body. Otherwise negative.

LIGAMENTS

Cruciates:  Intact.

Collaterals:  Intact.

CARTILAGE

Patellofemoral: Thinning is most notable at the superior pole of the
patella.

Medial:  Mildly to moderately degenerated without focal defect.

Lateral:  Mildly degenerated

Joint:  Small effusion.

Popliteal Fossa:  No Baker's cyst.

Extensor Mechanism:  Intact.

Bones: The patient has a nondisplaced fracture of the anterior
aspect of the lateral tibial plateau with associated marrow edema.
No step-off at the articular surface is identified.

Other: None.
IMPRESSION: Nondisplaced fracture of the anterior aspect of the lateral tibial
plateau with associated marrow edema consistent with recent injury.

Negative for meniscal or ligament tear. Fraying along the free edge
of the body of the lateral meniscus noted.

Mild osteoarthritis.

## 2019-06-17 ENCOUNTER — Telehealth (INDEPENDENT_AMBULATORY_CARE_PROVIDER_SITE_OTHER): Payer: Medicare Other | Admitting: Family Medicine

## 2019-06-17 DIAGNOSIS — S82141D Displaced bicondylar fracture of right tibia, subsequent encounter for closed fracture with routine healing: Secondary | ICD-10-CM

## 2019-06-17 NOTE — Progress Notes (Signed)
Virtual Visit via Video Note  I connected with Carolyn Mckinney on 06/17/19 at 11:30 AM EST by a video enabled telemedicine application and verified that I am speaking with the correct person using two identifiers.   I discussed the limitations of evaluation and management by telemedicine and the availability of in person appointments. The patient expressed understanding and agreed to proceed.  History of Present Illness:  Carolyn Mckinney is a 74 year old female that is following up for her right knee pain.  MRI was revealing for a nondisplaced tibial plateau fracture.  She has been wearing the brace.  She has not obtained a rolling walker yet.  Denies any numbness or tingling.   Observations/Objective:  Gen: NAD, alert, cooperative with exam, well-appearing  Assessment and Plan:  Tibial plateau fracture of the right knee: MRI was confirming the tibial plateau fracture.  This is currently nondisplaced.  Initial injury was on 12/6. -Continue brace. -Counseled on trying to be nonweightbearing. -Counseled on supportive care. -Please consider calling independently. -Follow-up in 2 weeks to image at that time.  Follow Up Instructions:    I discussed the assessment and treatment plan with the patient. The patient was provided an opportunity to ask questions and all were answered. The patient agreed with the plan and demonstrated an understanding of the instructions.   The patient was advised to call back or seek an in-person evaluation if the symptoms worsen or if the condition fails to improve as anticipated.    Clearance Coots, MD

## 2019-06-17 NOTE — Assessment & Plan Note (Signed)
MRI was confirming the tibial plateau fracture.  This is currently nondisplaced.  Initial injury was on 12/6. -Continue brace. -Counseled on trying to be nonweightbearing. -Counseled on supportive care. -Please consider calling independently. -Follow-up in 2 weeks to image at that time.

## 2019-06-18 ENCOUNTER — Ambulatory Visit: Payer: Medicare Other | Admitting: Family Medicine

## 2019-06-19 ENCOUNTER — Encounter: Payer: Self-pay | Admitting: Family Medicine

## 2019-06-22 ENCOUNTER — Ambulatory Visit: Payer: Medicare Other | Attending: Internal Medicine

## 2019-06-22 DIAGNOSIS — Z23 Encounter for immunization: Secondary | ICD-10-CM | POA: Insufficient documentation

## 2019-06-22 NOTE — Progress Notes (Signed)
   Covid-19 Vaccination Clinic  Name:  Carolyn Mckinney    MRN: [id number] DOB: 11/10/1945  06/22/2019  [name] was observed post Covid-19 immunization for 15 minutes without incidence. She was provided with Vaccine Information Sheet and instruction to access the V-Safe system.   [name] was instructed to call 911 with any severe reactions post vaccine: Marland Kitchen Difficulty breathing  . Swelling of your face and throat  . A fast heartbeat  . A bad rash all over your body  . Dizziness and weakness    Immunizations Administered    Name Date Dose VIS Date Route   Pfizer COVID-19 Vaccine 06/22/2019  3:00 PM 0.3 mL 04/04/2019 Intramuscular   Manufacturer: [place]   Lot: [id number]   [place]: [id number]

## 2019-06-24 ENCOUNTER — Encounter: Payer: Self-pay | Admitting: Family Medicine

## 2019-06-30 ENCOUNTER — Other Ambulatory Visit: Payer: Self-pay

## 2019-07-01 ENCOUNTER — Other Ambulatory Visit: Payer: Self-pay

## 2019-07-01 ENCOUNTER — Ambulatory Visit (INDEPENDENT_AMBULATORY_CARE_PROVIDER_SITE_OTHER): Payer: Medicare Other | Admitting: Family Medicine

## 2019-07-01 ENCOUNTER — Encounter: Payer: Self-pay | Admitting: Family Medicine

## 2019-07-01 ENCOUNTER — Ambulatory Visit (HOSPITAL_BASED_OUTPATIENT_CLINIC_OR_DEPARTMENT_OTHER)
Admission: RE | Admit: 2019-07-01 | Discharge: 2019-07-01 | Disposition: A | Discharge status: Home / Self Care | Payer: Medicare Other | Source: Ambulatory Visit | Attending: Family Medicine | Admitting: Family Medicine

## 2019-07-01 VITALS — BP 130/80 | HR 80 | Temp 96.4°F | Ht 63.0 in | Wt 155.2 lb

## 2019-07-01 VITALS — BP 139/89 | HR 80 | Ht 63.0 in | Wt 150.0 lb

## 2019-07-01 DIAGNOSIS — S82141D Displaced bicondylar fracture of right tibia, subsequent encounter for closed fracture with routine healing: Secondary | ICD-10-CM

## 2019-07-01 DIAGNOSIS — R29898 Other symptoms and signs involving the musculoskeletal system: Secondary | ICD-10-CM | POA: Diagnosis not present

## 2019-07-01 DIAGNOSIS — R42 Dizziness and giddiness: Secondary | ICD-10-CM

## 2019-07-01 DIAGNOSIS — L709 Acne, unspecified: Secondary | ICD-10-CM

## 2019-07-01 DIAGNOSIS — R202 Paresthesia of skin: Secondary | ICD-10-CM

## 2019-07-01 DIAGNOSIS — S8991XA Unspecified injury of right lower leg, initial encounter: Secondary | ICD-10-CM | POA: Diagnosis not present

## 2019-07-01 DIAGNOSIS — M25561 Pain in right knee: Secondary | ICD-10-CM | POA: Diagnosis not present

## 2019-07-01 IMAGING — CR DG KNEE COMPLETE 4+V*R*
4 series · 4 of 4 positions shown · non-contrast
Comparison: None.

CLINICAL DATA: Follow-up injury 1 month ago.  Knee pain.

EXAM:
RIGHT KNEE - COMPLETE 4+ VIEW

[t knee ap right]
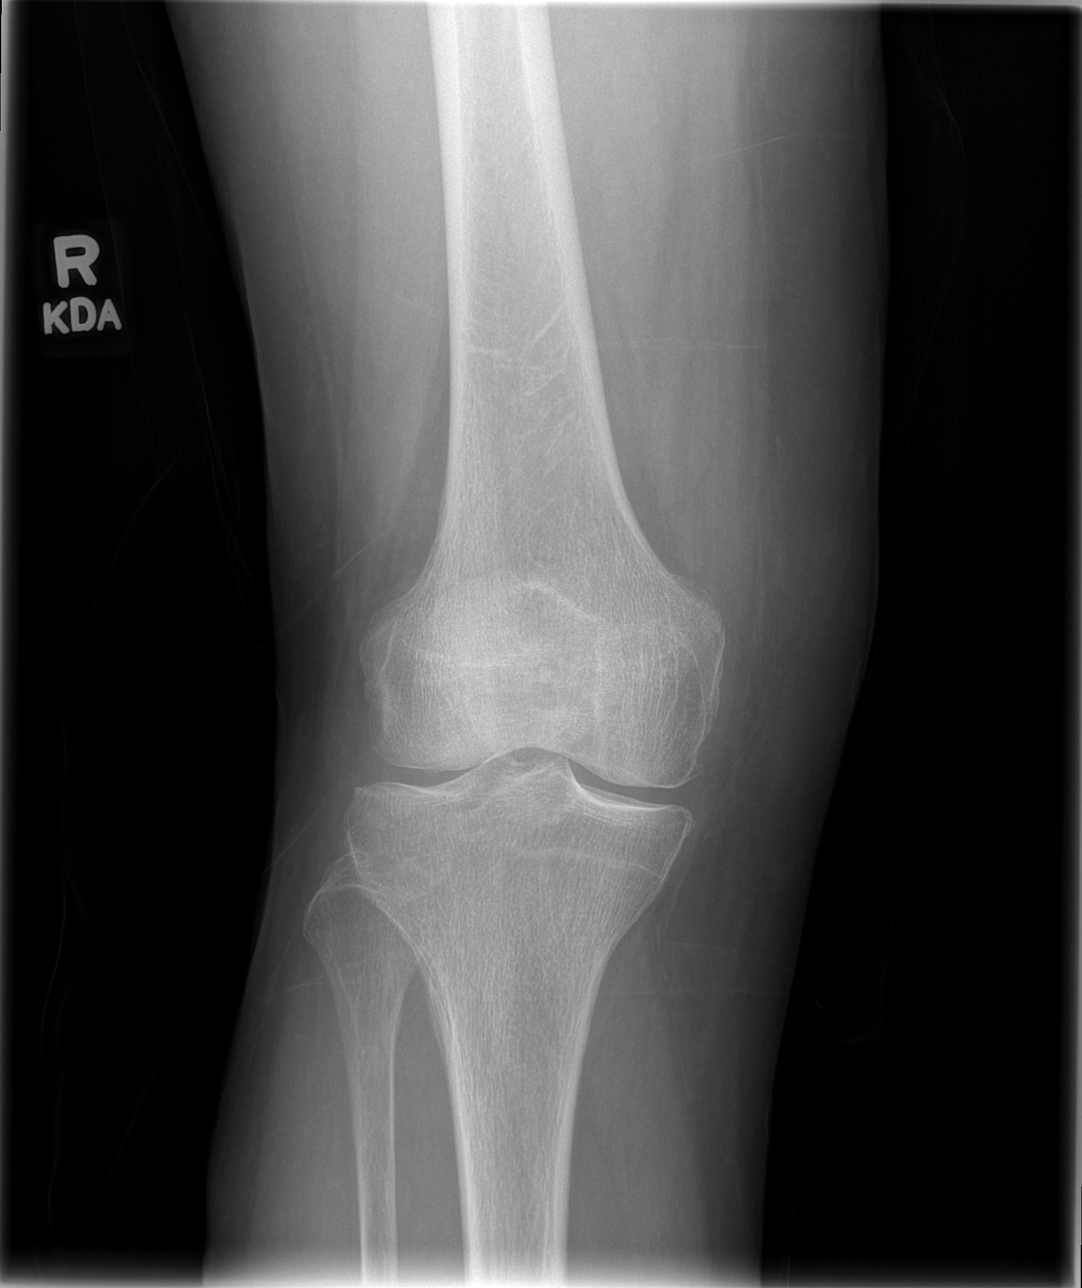

[t knee oblique right (1 of 2)]
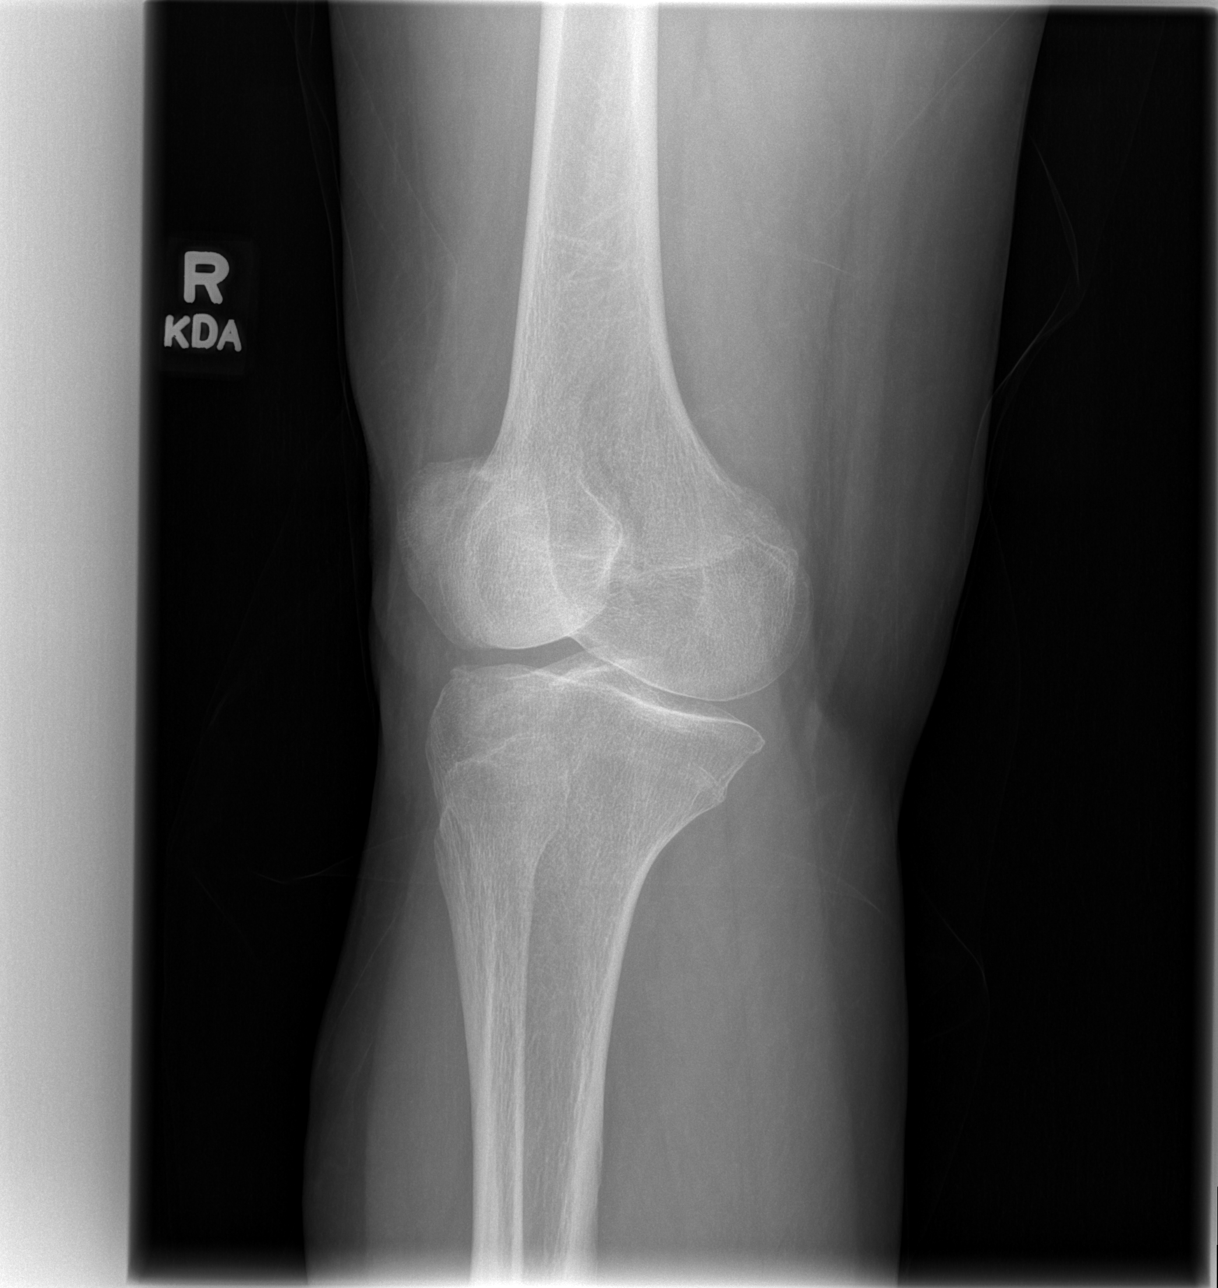

[t knee oblique right (2 of 2)]
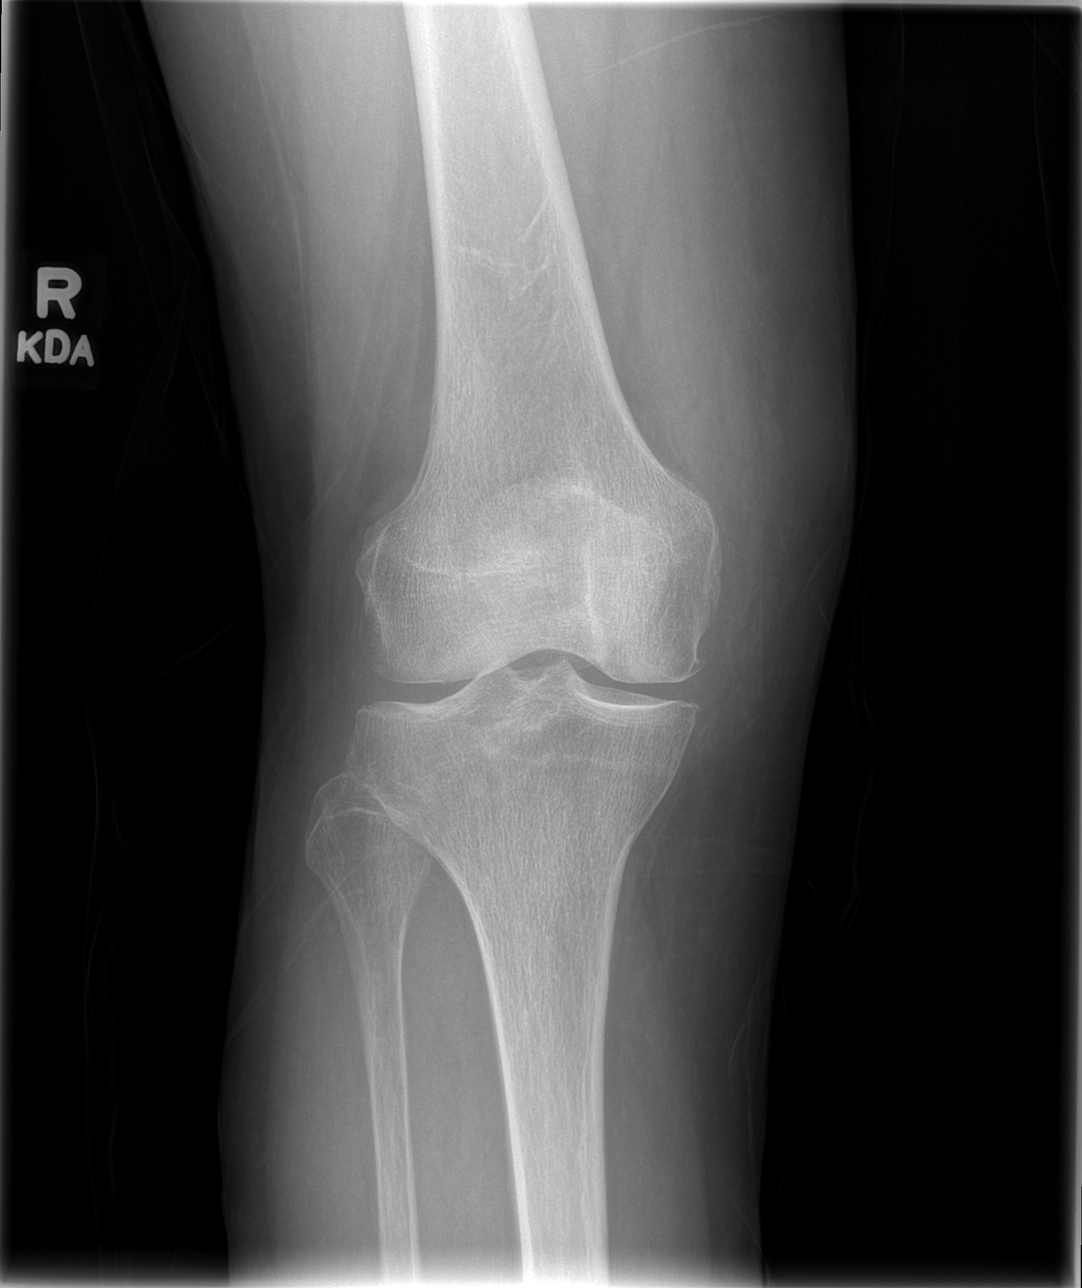

[t knee lat right]
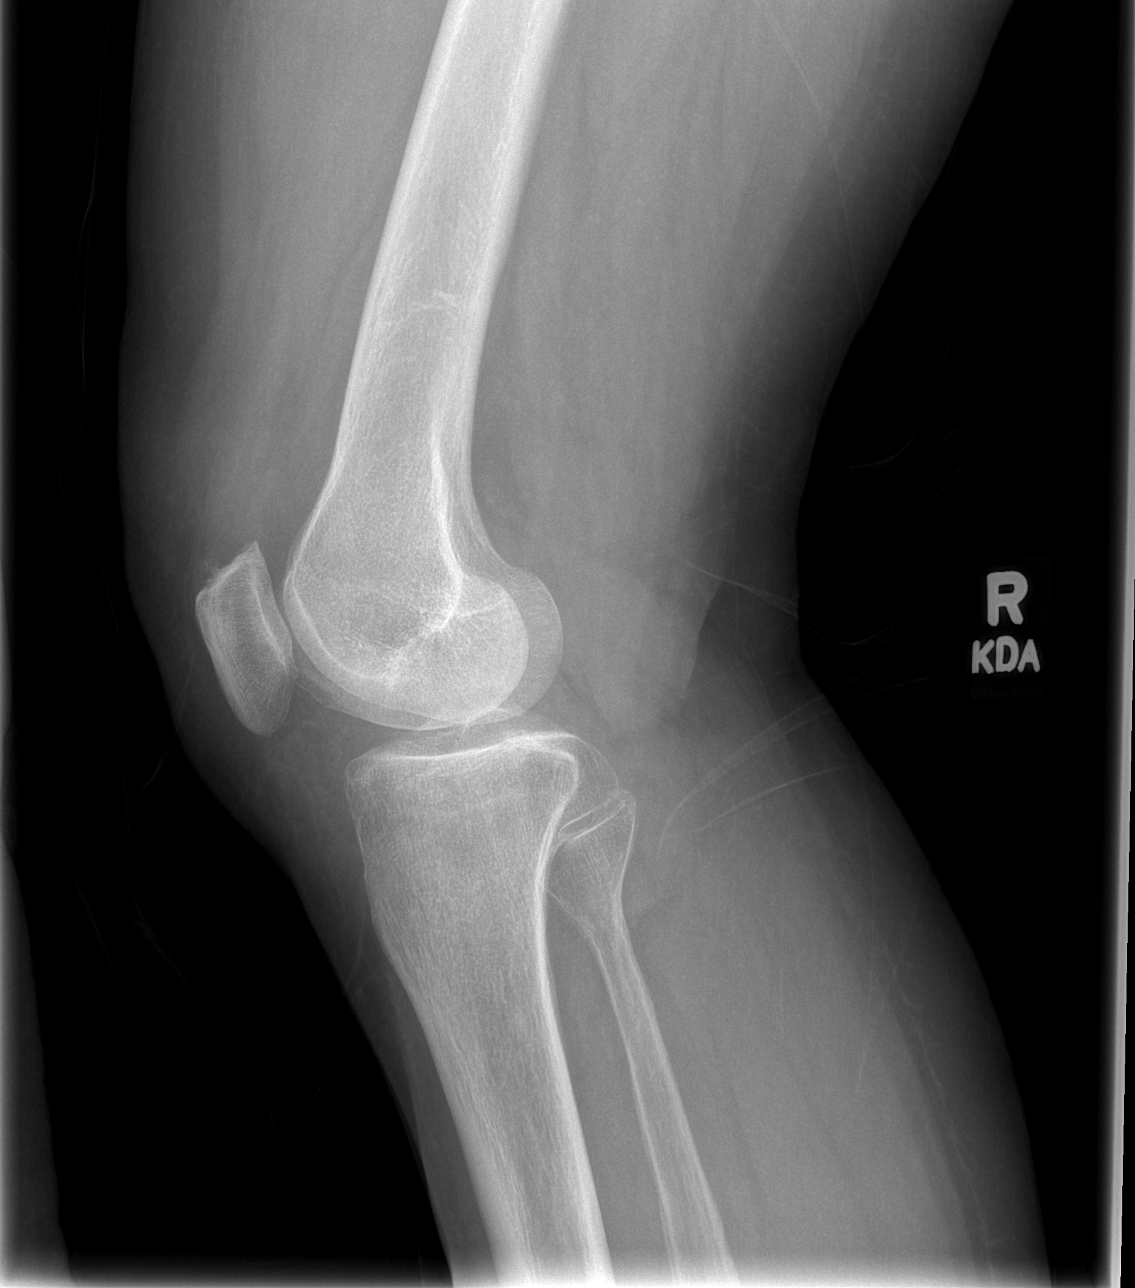

[4 of 4 positions shown; findings below may reference images not displayed]

FINDINGS: No acute bony abnormality. Specifically, no fracture, subluxation,
or dislocation. No joint effusion. Early joint space narrowing in
the patellofemoral compartment. Soft tissues unremarkable.
IMPRESSION: No acute bony abnormality.

## 2019-07-01 MED ORDER — DULOXETINE HCL 30 MG PO CPEP: 30.0000 mg | ORAL_CAPSULE | Freq: Every day | ORAL | 3 refills | Status: DC

## 2019-07-01 MED ORDER — DOXYCYCLINE HYCLATE 100 MG PO TABS: 100.0000 mg | ORAL_TABLET | Freq: Two times a day (BID) | ORAL | 1 refills | Status: DC

## 2019-07-01 NOTE — Assessment & Plan Note (Signed)
Injury on 2/6. Feeling improvement with brace and using rolling walker.  - xray  - counseled on supportive care  - would consider bone density scan.

## 2019-07-01 NOTE — Patient Instructions (Signed)
Good to see you  Please continue the brace and rolling walker  Please try vitamin K2  Please try to elevate your feet  I will call with the results from today  Please send me a message in MyChart with any questions or updates.  Please see me back in 3-4 weeks.   --Dr. Raeford Razor

## 2019-07-01 NOTE — Progress Notes (Signed)
Carolyn Mckinney - 74 y.o. female MRN [id number]  Date of birth: October 13, 1945  SUBJECTIVE:  Including CC & ROS.  Chief Complaint  Patient presents with  . Follow-up    follow up for right knee    Carolyn Mckinney is a 74 y.o. female that is following up for right knee fracture.  She also expresses weakness in the lower extremities.  She is doing better with the brace and a walker.  She denies any giving way.  She does endorse swelling in the lower extremity with some pain of the right foot.  She also has this lower extremity weakness that has been ongoing since she returned from [place].  Denies any specific inciting event.  She does feel weak with pain and rolling over in bed.  Has had an EMG performed.   Review of Systems See HPI   HISTORY: Past Medical, Surgical, Social, and Family History Reviewed & Updated per EMR.   Pertinent Historical Findings include:  Past Medical History:  Diagnosis Date  . Allergy   . Cancer (HCC)    Breast  . Hypertension   . Meniere's disease     Past Surgical History:  Procedure Laterality Date  . ABDOMINAL HYSTERECTOMY    . BREAST LUMPECTOMY      Family History  Problem Relation Age of Onset  . Arthritis Mother   . Heart disease Father     Social History   Socioeconomic History  . Marital status: Widowed    Spouse name: Not on file  . Number of children: Not on file  . Years of education: Not on file  . Highest education level: Not on file  Occupational History  . Not on file  Tobacco Use  . Smoking status: Never Smoker  . Smokeless tobacco: Never Used  Substance and Sexual Activity  . Alcohol use: Yes    Comment: just wine  . Drug use: Never  . Sexual activity: Not on file  Other Topics Concern  . Not on file  Social History Narrative  . Not on file   Social Determinants of Health   Financial Resource Strain:   . Difficulty of Paying Living Expenses: Not on file  Food Insecurity:   . Worried About Charity fundraiser in the  Last Year: Not on file  . Ran Out of Food in the Last Year: Not on file  Transportation Needs:   . Lack of Transportation (Medical): Not on file  . Lack of Transportation (Non-Medical): Not on file  Physical Activity:   . Days of Exercise per Week: Not on file  . Minutes of Exercise per Session: Not on file  Stress:   . Feeling of Stress : Not on file  Social Connections:   . Frequency of Communication with Friends and Family: Not on file  . Frequency of Social Gatherings with Friends and Family: Not on file  . Attends Religious Services: Not on file  . Active Member of Clubs or Organizations: Not on file  . Attends Archivist Meetings: Not on file  . Marital Status: Not on file  Intimate Partner Violence:   . Fear of Current or Ex-Partner: Not on file  . Emotionally Abused: Not on file  . Physically Abused: Not on file  . Sexually Abused: Not on file     PHYSICAL EXAM:  VS: BP 139/89   Pulse 80   Ht 5\' 3"  (1.6 m)   Wt 150 lb (68 kg)   BMI 26.57  kg/m  Physical Exam Gen: NAD, alert, cooperative with exam, well-appearing MSK:  Right knee: Tenderness palpation of the lateral joint line. No obvious effusion. Normal flexion extension. Some pitting edema in the lateral ankle and foot. Neurovascularly intact     ASSESSMENT & PLAN:   Weakness of both legs Seems to be ongoing since returning from [place]. No signs of atrophy. Emg was normal. Possible for spinal stenosis. Less likely for PMR. Doesn't seem as likely for claudication.  - consider sed rate, crp or ck  - could consider imaging of lumbar  - consider ABI's  - consider b12, folate, tsh  Closed fracture of right tibial plateau Injury on 2/6. Feeling improvement with brace and using rolling walker.  - xray  - counseled on supportive care  - would consider bone density scan.

## 2019-07-01 NOTE — Patient Instructions (Addendum)
Call Dr. Posey Pronto for an appt in 6 weeks to check on your paresthesias and weakness in your legs.  Keep the diet clean and stay active.  If you do not hear anything about your referral in the next 1-2 weeks, call our office and ask for an update.  Let us know if you need anything.

## 2019-07-01 NOTE — Assessment & Plan Note (Signed)
Seems to be ongoing since returning from [place]. No signs of atrophy. Emg was normal. Possible for spinal stenosis. Less likely for PMR. Doesn't seem as likely for claudication.  - consider sed rate, crp or ck  - could consider imaging of lumbar  - consider ABI's  - consider b12, folate, tsh

## 2019-07-01 NOTE — Progress Notes (Signed)
Chief Complaint  Patient presents with  . Follow-up    Discuss several problems    Subjective: Patient is a 74 y.o. female here for several issues.  Pt w a hx of acne. Appt w derm on 4/15, requesting abx as she was on this before and it helped. She is not a fan of benzoyl peroxide due to burning effect it has. Acne mainly on face.  Pt w hx of vertigo. Tx for ear effusion by ENT helped fullness, but not dizziness. She was also placed on a diuretic for possible MD, but that did not help.   Pt was hx of paresthesias. Sees Neuro. Failed gabapentin, venlafaxine, Elavil. Currently on Lyrica, Cymbalta was rec'd next.  Also feeling weaker on her feet.   ROS: Heart: Denies chest pain  Lungs: Denies SOB   Past Medical History:  Diagnosis Date  . Allergy   . Cancer (HCC)    Breast  . Hypertension   . Meniere's disease     Objective: BP 130/80 (BP Location: Left Arm, Patient Position: Sitting, Cuff Size: Normal)   Pulse 80   Temp (!) 96.4 F (35.8 C) (Temporal)   Ht 5\' 3"  (1.6 m)   Wt 155 lb 4 oz (70.4 kg)   SpO2 97%   BMI 27.50 kg/m  General: Awake, appears stated age HEENT: MMM, EOMi Heart: RRR Lungs: CTAB, no rales, wheezes or rhonchi. No accessory muscle use Neuro: Gait is slow Psych: Age appropriate judgment and insight, normal affect and mood  Assessment and Plan: Acne, unspecified acne type - Plan: doxycycline (VIBRA-TABS) 100 MG tablet  Weakness of both lower extremities - Plan: Ambulatory referral to Physical Therapy  Paresthesias - Plan: DULoxetine (CYMBALTA) 30 MG capsule  Dizziness  1- Doxy bid until she gets in with derm. Could consider macrolide if not helping. 2- PT. 3- Trial Cymbalta. F/u with Neuro to see if there is anything else they can do. Cont Lyrica. 4- Vest rehab. F/u in 6 mo or prn. The patient voiced understanding and agreement to the plan.  [place], DO 07/01/19  8:15 PM

## 2019-07-02 ENCOUNTER — Telehealth: Payer: Self-pay | Admitting: Family Medicine

## 2019-07-02 NOTE — Telephone Encounter (Signed)
Informed of results.   Rosemarie Ax, MD [facility] 07/02/2019, 3:18 PM

## 2019-07-07 ENCOUNTER — Encounter: Payer: Self-pay | Admitting: Family Medicine

## 2019-07-08 ENCOUNTER — Ambulatory Visit (INDEPENDENT_AMBULATORY_CARE_PROVIDER_SITE_OTHER): Payer: Medicare Other | Admitting: Family Medicine

## 2019-07-08 ENCOUNTER — Encounter: Payer: Self-pay | Admitting: Family Medicine

## 2019-07-08 ENCOUNTER — Other Ambulatory Visit: Payer: Self-pay

## 2019-07-08 VITALS — BP 126/78 | HR 79 | Temp 96.2°F | Ht 63.0 in | Wt 157.1 lb

## 2019-07-08 DIAGNOSIS — M79671 Pain in right foot: Secondary | ICD-10-CM

## 2019-07-08 NOTE — Progress Notes (Signed)
Musculoskeletal Exam  Patient: Carolyn Mckinney DOB: 16-Jun-1945  DOS: 07/08/2019  SUBJECTIVE:  Chief Complaint:   Chief Complaint  Patient presents with  . Foot Swelling    right    Carolyn Mckinney is a 74 y.o.  female for evaluation and treatment of R foot pain.   Several weeks of R foot pain being tx'd by the sports med team. Martin Majestic to church and was told by orthopedic surgeon to have her doctor check for a dvt after her fall. She has been active and moving. She is having swelling. She cramps in her calves and is sore during the day. No SOB or hx of clots.   ROS: Musculoskeletal/Extremities: +foot pain  Past Medical History:  Diagnosis Date  . Allergy   . Cancer (HCC)    Breast  . Hypertension   . Meniere's disease     Objective: VITAL SIGNS: BP 126/78 (BP Location: Left Arm, Patient Position: Sitting, Cuff Size: Normal)   Pulse 79   Temp (!) 96.2 F (35.7 C) (Temporal)   Ht 5\' 3"  (1.6 m)   Wt 157 lb 2 oz (71.3 kg)   SpO2 95%   BMI 27.83 kg/m  Constitutional: Well formed, well developed. No acute distress. Cardiovascular: Brisk cap refill Thorax & Lungs: No accessory muscle use Musculoskeletal: RLE/foot.   Normal active range of motion: yes.   Normal passive range of motion: yes Tenderness to palpation: yes b/l calves and top of foot Deformity: no Ecchymosis: no Tests positive: none Tests negative: Homan's, squeeze Neurologic: Normal sensory function. No focal deficits noted.  Psychiatric: Normal mood. Age appropriate judgment and insight. Alert & oriented x 3.    Assessment:  Right foot pain  Plan: Low risk for DVT. She is active, no bed rest, has a better reason for clot due to her nighttime cramping. Foot pain being worked up by sports med, suspect ligamentous issue given her exam. Reassurance, no need for doppler.  F/u prn. The patient voiced understanding and agreement to the plan.   [place], DO 07/08/19  3:15 PM

## 2019-07-08 NOTE — Patient Instructions (Signed)
I am not concerned about there being a clot in your foot.  Your are low risk for a blood clot. Stay active. If your swelling and calf pain (outside of your nighttime cramps) get worse, let me know.   Let us know if you need anything.

## 2019-07-10 ENCOUNTER — Ambulatory Visit: Payer: Medicare Other | Admitting: Physical Therapy

## 2019-07-15 ENCOUNTER — Other Ambulatory Visit: Payer: Self-pay

## 2019-07-15 ENCOUNTER — Encounter: Payer: Self-pay | Admitting: Physical Therapy

## 2019-07-15 ENCOUNTER — Ambulatory Visit: Payer: Medicare Other | Attending: Family Medicine | Admitting: Physical Therapy

## 2019-07-15 DIAGNOSIS — R2681 Unsteadiness on feet: Secondary | ICD-10-CM

## 2019-07-15 DIAGNOSIS — M25561 Pain in right knee: Secondary | ICD-10-CM | POA: Diagnosis not present

## 2019-07-15 DIAGNOSIS — R29898 Other symptoms and signs involving the musculoskeletal system: Secondary | ICD-10-CM | POA: Diagnosis not present

## 2019-07-15 DIAGNOSIS — G8929 Other chronic pain: Secondary | ICD-10-CM | POA: Diagnosis not present

## 2019-07-15 DIAGNOSIS — M25562 Pain in left knee: Secondary | ICD-10-CM | POA: Insufficient documentation

## 2019-07-15 NOTE — Therapy (Signed)
[facility] [address]  [place]yn Mckin[place], [place], [address] Phone: [phone]   Fax:  [phone]  Physical Therapy Evaluation  Patient Details  Name: Carolyn Mckinney MRN: [id number] Date of Birth: [birth year] Referring Provider (PT): Riki Sheer, DO (& Dr. Raeford Razor for knee pain)   Encounter Date: 07/15/2019  PT End of Session - 07/15/19 1628    Visit Number  1    Number of Visits  7    Date for PT Re-Evaluation  08/26/19    Authorization Type  Medicare & AARP    PT Start Time  [id number]    PT Stop Time  1615    PT Time Calculation (min)  45 min    Activity Tolerance  Patient tolerated treatment well;Patient limited by pain    Behavior During Therapy  [facility] for tasks assessed/performed       Past Medical History:  Diagnosis Date  . Allergy   . Cancer (HCC)    Breast  . Hypertension   . Meniere's disease     Past Surgical History:  Procedure Laterality Date  . ABDOMINAL HYSTERECTOMY    . BREAST LUMPECTOMY      There were no vitals filed for this visit.   Subjective Assessment - 07/15/19 1534    Subjective  Patient reports that on 05/31/19 she fractured her R knee. Has been wearing a R knee brace since initial onset, but not lately d/t resolution of pain. No longer walking with SPC at home, but using it while out in the community. Pain is located over R lateral knee but has improved. Worse with pivoting and twisting. Overall feeling very weak since her injury. Swelling in R ankle and foot has improved. Also has hx of L knee pain which she feels is being flared up d/t recent R knee injury.    Pertinent History  meniere's disease, HTN, CA    Limitations  Sitting;Standing;Walking;House hold activities    How long can you stand comfortably?  limited by fatigue- but variable    How long can you walk comfortably?  limited by fatigue- about 1 hour    Diagnostic tests  06/14/19 R knee MRI: Nondisplaced fracture of the  anterior aspect of the lateral tibialplateau with associated marrow edema consistent with recent injury; more recent R knee xray showed no acute bony abnormality    Patient Stated Goals  "figure out what I can do at home"    Currently in Pain?  No/denies    Pain Score  0-No pain         [facility] PT Assessment - 07/15/19 1542      Assessment   Medical Diagnosis  Weakness of B LEs    Referring Provider (PT)  Riki Sheer, DO   & Dr. Raeford Razor for knee pain   Onset Date/Surgical Date  05/31/19    Next MD Visit  no scheduled      Balance Screen   Has the patient fallen in the past 6 months  Yes    How many times?  1   hit by a dog   Has the patient had a decrease in activity level because of a fear of falling?   No    Is the patient reluctant to leave their home because of a fear of falling?   No      Home Social worker  Private residence    Living Arrangements  Alone    Available  Help at Discharge  Friend(s)    Type of Home  --   condo   Home Access  Level entry    [place] - single point;Walker - 4 wheels      Prior Function   Level of Independence  Independent    Vocation  Retired    Leisure  travel to [place]      Cognition   Overall Cognitive Status  Within Functional Limits for tasks assessed      Sensation   Light Touch  Appears Intact   c/o chronic pins/needles in B feet     Coordination   Gross Motor Movements are Fluid and Coordinated  Yes      Posture/Postural Control   Posture/Postural Control  Postural limitations    Postural Limitations  Rounded Shoulders;Forward head      ROM / Strength   AROM / PROM / Strength  AROM;Strength;PROM      AROM   AROM Assessment Site  Knee    Right/Left Knee  Right;Left    Right Knee Extension  0   c/o heaviness   Right Knee Flexion  120    Left Knee Extension  5    Left Knee Flexion  110      PROM   PROM Assessment Site  Knee    Right/Left Knee   Right;Left    Right Knee Extension  0    Right Knee Flexion  123   pain   Left Knee Extension  3    Left Knee Flexion  113   pain     Strength   Strength Assessment Site  Hip;Knee;Ankle    Right/Left Hip  Right;Left    Right Hip Flexion  4/5    Right Hip ABduction  4+/5    Right Hip ADduction  4+/5    Left Hip Flexion  4/5    Left Hip ABduction  4+/5    Left Hip ADduction  4+/5    Right/Left Knee  Right;Left    Right Knee Flexion  4+/5    Right Knee Extension  5/5    Left Knee Flexion  4+/5   c/o weakness   Left Knee Extension  5/5    Right/Left Ankle  Right;Left    Right Ankle Dorsiflexion  4+/5    Right Ankle Plantar Flexion  4+/5    Left Ankle Dorsiflexion  4+/5    Left Ankle Plantar Flexion  4+/5      Flexibility   Soft Tissue Assessment /Muscle Length  yes    Hamstrings  B mildly tight    Quadriceps  B mildly tight      Palpation   Patella mobility  hypomobility in M/L directions B, normal superior/inferior B    Palpation comment  TTP over R lateral jt line and distal lateral HS tendon and patellar tendon; TTP over L lateral jt line       Ambulation/Gait   Assistive device  Straight cane    Gait Pattern  Decreased step length - right;Decreased step length - left    Ambulation Surface  Level;Indoor    Gait velocity  decreased                Objective measurements completed on examination: See above findings.              PT Education - 07/15/19 1628    Education Details  prognosis, POC, HEP  Person(s) Educated  Patient    Methods  Explanation;Demonstration;Tactile cues;Verbal cues;Handout    Comprehension  Verbalized understanding;Returned demonstration       PT Short Term Goals - 07/15/19 1637      PT SHORT TERM GOAL #1   Title  Patient to be independent with initial HEP.    Time  3    Period  Weeks    Status  New    Target Date  08/05/19        PT Long Term Goals - 07/15/19 1638      PT LONG TERM GOAL #1   Title   Patient to be independent with advanced HEP.    Time  6    Period  Weeks    Status  New    Target Date  08/26/19      PT LONG TERM GOAL #2   Title  Patient to demosntrate B knee AROM/PROM 0-120 degrees and nonpainful.    Time  6    Period  Weeks    Status  New    Target Date  08/26/19      PT LONG TERM GOAL #3   Title  Patient to report tolerance for 3 hours of standing/walking without pain or fatigue limiting.    Time  6    Period  Weeks    Status  New    Target Date  08/26/19      PT LONG TERM GOAL #4   Title  Patient to score >19/24 on DGI in order to decrease risk of falls.    Time  6    Period  Weeks    Status  New    Target Date  08/26/19      PT LONG TERM GOAL #5   Title  Patient to demonstrate reciprocal stair climbing up/down 14 steps with 1 handrail as needed with good stability and no pain.    Time  6    Period  Weeks    Status  New    Target Date  08/26/19             Plan - 07/15/19 1631    Clinical Impression Statement  Patient is a 74y/o F presenting to OPPT with c/o acute R and chronic L knee pain. Sustained a R lateral tibial plateau fx after a fall on 05/31/19 and notes worsening of her chronic L knee pain since this injury d/t compensations. Currently still ambulating with R knee brace; using SPC when out in community. R knee pain worse with pivoting movements. Patient today presenting with painful and slightly limited B knee ROM, B hip flexor weakness, mild tightness in B HS and quads, B M/L patellar hypomobility, and gait deviations. Patient educated on gentle stretching and strengthening HEP- patient reported understanding Plan to assess balance next session as patient reported worsening balance. Would benefit from skilled PT services 1x/week for 6 weeks to address aforementioned impairments.    Personal Factors and Comorbidities  Age;Comorbidity 3+;Fitness;Past/Current Experience;Time since onset of injury/illness/exacerbation    Comorbidities   meniere's disease, HTN, breast CA with lumpectomy    Examination-Activity Limitations  Squat;Stairs;Stand;Locomotion Level    Examination-Participation Restrictions  Church;Cleaning;Shop;Community Activity;Driving;Laundry;Meal Prep    Stability/Clinical Decision Making  Stable/Uncomplicated    Clinical Decision Making  Low    Rehab Potential  Good    PT Frequency  1x / week    PT Duration  6 weeks    PT Treatment/Interventions  ADLs/Self Care Home Management;Cryotherapy;Electrical Stimulation;Iontophoresis  4mg /ml Dexamethasone;Moist Heat;Balance training;Therapeutic exercise;Therapeutic activities;Functional mobility training;Stair training;Gait training;Ultrasound;Neuromuscular re-education;Patient/family education;Manual techniques;Vasopneumatic Device;Taping;Energy conservation;Dry needling;Passive range of motion    PT Next Visit Plan  reassess HEP; assess DGI    Consulted and Agree with Plan of Care  Patient       Patient will benefit from skilled therapeutic intervention in order to improve the following deficits and impairments:  Hypomobility, Decreased activity tolerance, Decreased strength, Pain, Decreased balance, Difficulty walking, Decreased range of motion, Postural dysfunction, Impaired flexibility  Visit Diagnosis: Acute pain of right knee  Chronic pain of left knee  Other symptoms and signs involving the musculoskeletal system  Unsteadiness on feet     Problem List Patient Active Problem List   Diagnosis Date Noted  . Weakness of both legs 07/01/2019  . Closed fracture of right tibial plateau 06/11/2019  . 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 Acne 06/03/2018  . Insomnia 06/03/2018  . Neuropathic pain 05/02/2018  . Essential hypertension 05/02/2018  . Meniere disease, 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     Janene Harvey, PT, DPT 07/15/19 4:42 PM   [facility] [address]  [place], [place], [address] Phone:  [phone]   Fax:  [phone]  Name: Paetynn Feurtado MRN: [id number] Date of Birth: 02/08/1946

## 2019-07-17 ENCOUNTER — Other Ambulatory Visit: Payer: Self-pay | Admitting: Family Medicine

## 2019-07-17 DIAGNOSIS — M792 Neuralgia and neuritis, unspecified: Secondary | ICD-10-CM

## 2019-07-17 NOTE — Telephone Encounter (Signed)
Last written: 06/04/19 Last ov: 07/08/19 Next ov: none Contract: none UDS: none

## 2019-07-18 ENCOUNTER — Telehealth: Payer: Self-pay | Admitting: Family Medicine

## 2019-07-18 NOTE — Telephone Encounter (Signed)
The patient called to inform while confirming her 2nd Covid Vaccine appt. At the [facility] scheduled for Tuesday 3/30/2021the system shut down on her and she has not been able to get back in.   I informed her we have nothing to do with scheduling (she did not go through the [facility] system/contact info. For her shot) and she would need to locate a phone number to call/and suggested she go to that location early Tuesday morning (the day of her appt for 2nd vaccine) and hopefully someone that would be able to get her checked in. She verbalized understanding.

## 2019-07-18 NOTE — Telephone Encounter (Signed)
Patient would like call back in regards to covid vaccine on Tuesday. Patient states that she has some questions.

## 2019-07-22 ENCOUNTER — Ambulatory Visit: Payer: Medicare Other | Attending: Internal Medicine

## 2019-07-22 DIAGNOSIS — Z23 Encounter for immunization: Secondary | ICD-10-CM

## 2019-07-22 NOTE — Progress Notes (Signed)
   Covid-19 Vaccination Clinic  Name:  Carolyn Mckinney    MRN: [id number] DOB: 12/02/1945  07/22/2019  Carolyn Mckinney was observed post Covid-19 immunization for 15 minutes without incident. She was provided with Vaccine Information Sheet and instruction to access the V-Safe system.   Carolyn Mckinney was instructed to call 911 with any severe reactions post vaccine: Marland Kitchen Difficulty breathing  . Swelling of face and throat  . A fast heartbeat  . A bad rash all over body  . Dizziness and weakness   Immunizations Administered    Name Date Dose VIS Date Route   Pfizer COVID-19 Vaccine 07/22/2019  1:53 PM 0.3 mL 04/04/2019 Intramuscular   Manufacturer: [employer], [employer]   Lot: [id number]   [place]: [id number]

## 2019-07-28 ENCOUNTER — Other Ambulatory Visit: Payer: Self-pay | Admitting: Family Medicine

## 2019-07-28 DIAGNOSIS — L709 Acne, unspecified: Secondary | ICD-10-CM

## 2019-07-28 DIAGNOSIS — G4700 Insomnia, unspecified: Secondary | ICD-10-CM

## 2019-07-28 DIAGNOSIS — B002 Herpesviral gingivostomatitis and pharyngotonsillitis: Secondary | ICD-10-CM

## 2019-07-28 NOTE — Telephone Encounter (Signed)
Last Office Visit on 07/08/2019 Last Refill for Trazodone 01/27/2019  #60 with 3 refills.

## 2019-07-31 ENCOUNTER — Other Ambulatory Visit: Payer: Self-pay

## 2019-07-31 ENCOUNTER — Encounter: Payer: Self-pay | Admitting: Physical Therapy

## 2019-07-31 ENCOUNTER — Ambulatory Visit: Payer: Medicare Other | Attending: Family Medicine | Admitting: Physical Therapy

## 2019-07-31 DIAGNOSIS — R29898 Other symptoms and signs involving the musculoskeletal system: Secondary | ICD-10-CM | POA: Diagnosis not present

## 2019-07-31 DIAGNOSIS — R2681 Unsteadiness on feet: Secondary | ICD-10-CM | POA: Diagnosis not present

## 2019-07-31 DIAGNOSIS — M25561 Pain in right knee: Secondary | ICD-10-CM | POA: Diagnosis not present

## 2019-07-31 DIAGNOSIS — G8929 Other chronic pain: Secondary | ICD-10-CM | POA: Insufficient documentation

## 2019-07-31 DIAGNOSIS — M25562 Pain in left knee: Secondary | ICD-10-CM | POA: Diagnosis not present

## 2019-07-31 NOTE — Therapy (Addendum)
[facility] [address]  [place], [place], [address] Phone: [phone]   Fax:  [phone]  Physical Therapy Treatment  Patient Details  Name: Carolyn Mckinney MRN: [id number] Date of Birth: Sep 15, 1945 Referring Provider (PT): Riki Sheer, DO (& Dr. Raeford Razor for knee pain)   Progress Note Reporting Period 07/15/19 to 07/31/19  See note below for Objective Data and Assessment of Progress/Goals.     Encounter Date: 07/31/2019  PT End of Session - 07/31/19 1822    Visit Number  2    Number of Visits  7    Date for PT Re-Evaluation  08/26/19    Authorization Type  Medicare & AARP    PT Start Time  3704    PT Stop Time  1532    PT Time Calculation (min)  45 min    Equipment Utilized During Treatment  Gait belt    Activity Tolerance  Patient tolerated treatment well    Behavior During Therapy  WFL for tasks assessed/performed;Impulsive       Past Medical History:  Diagnosis Date  . Allergy   . Cancer (HCC)    Breast  . Hypertension   . Meniere's disease     Past Surgical History:  Procedure Laterality Date  . ABDOMINAL HYSTERECTOMY    . BREAST LUMPECTOMY      There were no vitals filed for this visit.  Subjective Assessment - 07/31/19 1450    Subjective  Patient reports that she is doing much better. Has nearly weaned off her cane but still feels like her balance is still an issue.    Pertinent History  meniere's disease, HTN, CA    Diagnostic tests  06/14/19 R knee MRI: Nondisplaced fracture of the anterior aspect of the lateral tibialplateau with associated marrow edema consistent with recent injury; more recent R knee xray showed no acute bony abnormality    Patient Stated Goals  "figure out what I can do at home"    Currently in Pain?  No/denies         [facility] PT Assessment - 07/31/19 0001      Standardized Balance Assessment   Standardized Balance Assessment  Dynamic Gait Index       Dynamic Gait Index   Level Surface  Normal    Change in Gait Speed  Severe Impairment    Gait with Horizontal Head Turns  Severe Impairment    Gait with Vertical Head Turns  Severe Impairment    Gait and Pivot Turn  Severe Impairment    Step Over Obstacle  Mild Impairment    Step Around Obstacles  Mild Impairment    Steps  Severe Impairment   miss-step causing LOB and requiring mod A to correct   Total Score  7    DGI comment:  no AD                   [facility] Adult PT Treatment/Exercise - 07/31/19 0001      Exercises   Exercises  Knee/Hip      Knee/Hip Exercises: Stretches   Quad Stretch  Right;Left;1 rep;30 seconds    Quad Stretch Limitations  prone with strap      Knee/Hip Exercises: Standing   Hip Abduction  Stengthening;Right;Left;1 set;10 reps;Knee straight    Abduction Limitations  at TM rail; cues to avoid lateral trunk lean and TKE    Hip Extension  Stengthening;Right;Left;1 set;10 reps;Knee straight    Extension Limitations  at TM rail; cues to avoid lateral trunk lean and TKE             PT Education - 07/31/19 1820    Education Details  heavy edu on use of SPC for safety d/t patient's increased risk of falls per DGI score; edu on stretch out strap for HEP; update to HEP- to be performed with UE support for safety    Person(s) Educated  Patient    Methods  Explanation;Demonstration;Tactile cues;Verbal cues;Handout    Comprehension  Verbalized understanding;Returned demonstration       PT Short Term Goals - 07/31/19 1830      PT SHORT TERM GOAL #1   Title  Patient to be independent with initial HEP.    Time  3    Period  Weeks    Status  On-going    Target Date  08/05/19        PT Long Term Goals - 07/31/19 1830      PT LONG TERM GOAL #1   Title  Patient to be independent with advanced HEP.    Time  6    Period  Weeks    Status  On-going      PT LONG TERM GOAL #2   Title  Patient to demosntrate B knee AROM/PROM 0-120 degrees and  nonpainful.    Time  6    Period  Weeks    Status  On-going      PT LONG TERM GOAL #3   Title  Patient to report tolerance for 3 hours of standing/walking without pain or fatigue limiting.    Time  6    Period  Weeks    Status  On-going      PT LONG TERM GOAL #4   Title  Patient to score >19/24 on DGI in order to decrease risk of falls.    Time  6    Period  Weeks    Status  On-going      PT LONG TERM GOAL #5   Title  Patient to demonstrate reciprocal stair climbing up/down 14 steps with 1 handrail as needed with good stability and no pain.    Time  6    Period  Weeks    Status  On-going            Plan - 07/31/19 1823    Clinical Impression Statement  Patient reporting improvement in pain and strength since last session. Still reporting dizziness and imbalance, yet noting that she has nearly weaned off her SPC entirely. Patient scored 7/24 on DGI without use of AD, indicating high risk of falls. Patient demonstrated LOB on stairs, requiring mod A for recovery and lost balance posteriorly frequently during duration of session. Patient also had difficulty maintaining static standing balance without use of cane. Patient expressed blatant refusal of SPC use as she "wants to get back to myself" despite heavy edu on benefits of cane use for safety. Reviewed LE stretching and educated patient on use of stretch out strap for improved ease with exercises. Updated HEP with standing hip strengthening with UE support for safety as this was well-tolerated today. Patient reported understanding.    Comorbidities  meniere's disease, HTN, breast CA with lumpectomy    PT Treatment/Interventions  ADLs/Self Care Home Management;Cryotherapy;Electrical Stimulation;Iontophoresis 59m/ml Dexamethasone;Moist Heat;Balance training;Therapeutic exercise;Therapeutic activities;Functional mobility training;Stair training;Gait training;Ultrasound;Neuromuscular re-education;Patient/family education;Manual  techniques;Vasopneumatic Device;Taping;Energy conservation;Dry needling;Passive range of motion    PT Next Visit Plan  LE strengthening, balance training  Consulted and Agree with Plan of Care  Patient       Patient will benefit from skilled therapeutic intervention in order to improve the following deficits and impairments:  Hypomobility, Decreased activity tolerance, Decreased strength, Pain, Decreased balance, Difficulty walking, Decreased range of motion, Postural dysfunction, Impaired flexibility  Visit Diagnosis: Acute pain of right knee  Chronic pain of left knee  Other symptoms and signs involving the musculoskeletal system  Unsteadiness on feet     Problem List Patient Active Problem List   Diagnosis Date Noted  . Weakness of both legs 07/01/2019  . Closed fracture of right tibial plateau 06/11/2019  . 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 Acne 06/03/2018  . Insomnia 06/03/2018  . Neuropathic pain 05/02/2018  . Essential hypertension 05/02/2018  . Meniere disease, 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     Janene Harvey, PT, DPT 07/31/19 6:32 PM   [facility] [address]  [place], [place], [address] Phone: [phone]   Fax:  [phone]  Name: Niomie Englert MRN: [id number] Date of Birth: 1945/08/26    PHYSICAL THERAPY DISCHARGE SUMMARY  Visits from Start of Care: 2  Current functional level related to goals / functional outcomes: Unable to assess; patient did not return   Remaining deficits: Unable to assess   Education / Equipment: HEP  Plan: Patient agrees to discharge.  Patient goals were not met. Patient is being discharged due to not returning since the last visit.  ?????     Janene Harvey, PT, DPT 10/08/19 2:03 PM

## 2019-08-07 DIAGNOSIS — D225 Melanocytic nevi of trunk: Secondary | ICD-10-CM | POA: Diagnosis not present

## 2019-08-07 DIAGNOSIS — D485 Neoplasm of uncertain behavior of skin: Secondary | ICD-10-CM | POA: Diagnosis not present

## 2019-08-07 DIAGNOSIS — D2262 Melanocytic nevi of left upper limb, including shoulder: Secondary | ICD-10-CM | POA: Diagnosis not present

## 2019-08-07 DIAGNOSIS — D692 Other nonthrombocytopenic purpura: Secondary | ICD-10-CM | POA: Diagnosis not present

## 2019-08-07 DIAGNOSIS — D2271 Melanocytic nevi of right lower limb, including hip: Secondary | ICD-10-CM | POA: Diagnosis not present

## 2019-08-07 DIAGNOSIS — D2261 Melanocytic nevi of right upper limb, including shoulder: Secondary | ICD-10-CM | POA: Diagnosis not present

## 2019-08-07 DIAGNOSIS — D2272 Melanocytic nevi of left lower limb, including hip: Secondary | ICD-10-CM | POA: Diagnosis not present

## 2019-08-07 DIAGNOSIS — L718 Other rosacea: Secondary | ICD-10-CM | POA: Diagnosis not present

## 2019-08-07 DIAGNOSIS — L821 Other seborrheic keratosis: Secondary | ICD-10-CM | POA: Diagnosis not present

## 2019-08-07 DIAGNOSIS — L814 Other melanin hyperpigmentation: Secondary | ICD-10-CM | POA: Diagnosis not present

## 2019-08-07 DIAGNOSIS — L819 Disorder of pigmentation, unspecified: Secondary | ICD-10-CM | POA: Diagnosis not present

## 2019-08-13 ENCOUNTER — Ambulatory Visit: Payer: Medicare Other

## 2019-10-14 NOTE — Progress Notes (Signed)
Subjective:   Carolyn Mckinney is a 74 y.o. female who presents for an Initial Medicare Annual Wellness Visit.  Review of Systems    Cardiac Risk Factors include: advanced age (>51men, >45 women);hypertension     Objective:     Advanced Directives 10/15/2019 07/15/2019  Does Patient Have a Medical Advance Directive? Yes Yes  Type of Paramedic of [place];Living will -  Does patient want to make changes to medical advance directive? No - Patient declined No - Patient declined  Copy of [place] in Chart? No - copy requested -    Current Medications (verified) Outpatient Encounter Medications as of 10/15/2019  Medication Sig  . amLODipine (NORVASC) 5 MG tablet Take 1 tablet by mouth once daily  . losartan (COZAAR) 100 MG tablet Take 1 tablet by mouth once daily  . pregabalin (LYRICA) 150 MG capsule Take 1 capsule by mouth twice daily  . traZODone (DESYREL) 50 MG tablet TAKE 1 TO 2 TABLETS BY MOUTH ONCE DAILY AT BEDTIME AS NEEDED FOR SLEEP  . triamterene-hydrochlorothiazide (MAXZIDE-25) 37.5-25 MG tablet Take 1 tablet by mouth daily.  . valACYclovir (VALTREX) 500 MG tablet TAKE 4 TABLETS BY MOUTH AT ONSET OF A BREAKOUT AND REPEAT IN 12 HOURS. TAKE 1 TABLET BY MOUTH DAILY.  . valsartan (DIOVAN) 320 MG tablet Take 1 tablet (320 mg total) by mouth daily.  . DULoxetine (CYMBALTA) 30 MG capsule Take 1 capsule (30 mg total) by mouth daily.  . [DISCONTINUED] benzoyl peroxide 5 % gel Apply topically daily.  . [DISCONTINUED] doxycycline (VIBRA-TABS) 100 MG tablet Take 1 tablet (100 mg total) by mouth 2 (two) times daily.  . [DISCONTINUED] minocycline (MINOCIN) 100 MG capsule Take 1 capsule by mouth twice daily   No facility-administered encounter medications on file as of 10/15/2019.    Allergies (verified) Patient has no known allergies.   History: Past Medical History:  Diagnosis Date  . Allergy   . Cancer (HCC)    Breast  . Hypertension     . Meniere's disease    Past Surgical History:  Procedure Laterality Date  . ABDOMINAL HYSTERECTOMY    . BREAST LUMPECTOMY     Family History  Problem Relation Age of Onset  . Arthritis Mother   . Heart disease Father    Social History   Socioeconomic History  . Marital status: Widowed    Spouse name: Not on file  . Number of children: Not on file  . Years of education: Not on file  . Highest education level: Not on file  Occupational History  . Not on file  Tobacco Use  . Smoking status: Never Smoker  . Smokeless tobacco: Never Used  Substance and Sexual Activity  . Alcohol use: Yes    Comment: just wine  . Drug use: Never  . Sexual activity: Not on file  Other Topics Concern  . Not on file  Social History Narrative  . Not on file   Social Determinants of Health   Financial Resource Strain: Low Risk   . Difficulty of Paying Living Expenses: Not hard at all  Food Insecurity: No Food Insecurity  . Worried About Charity fundraiser in the Last Year: Never true  . Ran Out of Food in the Last Year: Never true  Transportation Needs: No Transportation Needs  . Lack of Transportation (Medical): No  . Lack of Transportation (Non-Medical): No  Physical Activity:   . Days of Exercise per Week:   .  Minutes of Exercise per Session:   Stress:   . Feeling of Stress :   Social Connections:   . Frequency of Communication with Friends and Family:   . Frequency of Social Gatherings with Friends and Family:   . Attends Religious Services:   . Active Member of Clubs or Organizations:   . Attends Archivist Meetings:   Marland Kitchen Marital Status:     Tobacco Counseling Counseling given: Not Answered   Clinical Intake: Pain : No/denies pain    Activities of Daily Living In your present state of health, do you have any difficulty performing the following activities: 10/15/2019  Hearing? N  Vision? N  Difficulty concentrating or making decisions? N  Walking or climbing  stairs? Y  Dressing or bathing? N  Doing errands, shopping? N  Preparing Food and eating ? N  Using the Toilet? N  In the past six months, have you accidently leaked urine? N  Do you have problems with loss of bowel control? N  Managing your Medications? N  Managing your Finances? N  Housekeeping or managing your Housekeeping? N  Some recent data might be hidden    Patient Care Team: Shelda Pal, DO as PCP - General (Family Medicine) Alda Berthold, DO as Consulting Physician (Neurology)  Indicate any recent Medical Services you may have received from other than [facility] providers in the past year (date may be approximate).     Assessment:   This is a routine wellness examination for Carolyn Mckinney.  Hearing/Vision screen  Hearing Screening   125Hz  250Hz  500Hz  1000Hz  2000Hz  3000Hz  4000Hz  6000Hz  8000Hz   Right ear:           Left ear:           Comments: Passed whisper test    Visual Acuity Screening   Right eye Left eye Both eyes  Without correction: 20/20 20/20 20/20   With correction:       Dietary issues and exercise activities discussed: Current Exercise Habits: Home exercise routine, Type of exercise: strength training/weights;stretching;calisthenics, Time (Minutes): 60, Frequency (Times/Week): 5, Weekly Exercise (Minutes/Week): 300, Exercise limited by: None identified Diet (meal preparation, eat out, water intake, caffeinated beverages, dairy products, fruits and vegetables): well balanced    Goals    . DIET - INCREASE WATER INTAKE      Depression Screen PHQ 2/9 Scores 10/15/2019  PHQ - 2 Score 0    Fall Risk Fall Risk  10/15/2019 04/04/2019  Falls in the past year? 1 0  Comment dog knocked her over. -  Number falls in past yr: 0 0  Injury with Fall? 1 0  Follow up Education provided;Falls prevention discussed -    Any stairs in or around the home? No  If so, are there any without handrails? No  Home free of loose throw rugs in walkways, pet beds,  electrical cords, etc? Yes  Adequate lighting in your home to reduce risk of falls? Yes   ASSISTIVE DEVICES UTILIZED TO PREVENT FALLS:  Life alert? No  Use of a cane, walker or w/c? No  Grab bars in the bathroom? No  Shower chair or bench in shower? Yes  Elevated toilet seat or a handicapped toilet? No     Cognitive Function: Ad8 score reviewed for issues:  Issues making decisions:no  Less interest in hobbies / activities:no  Repeats questions, stories (family complaining):no  Trouble using ordinary gadgets (microwave, computer, phone):no  Forgets the month or year: no  Mismanaging finances: no  Remembering appts:no  Daily problems with thinking and/or memory:no Ad8 score is=0        Immunizations Immunization History  Administered Date(s) Administered  . Influenza,inj,Quad PF,6+ Mos 12/19/2018  . PFIZER SARS-COV-2 Vaccination 06/22/2019, 07/22/2019  . Pneumococcal Polysaccharide-23 05/02/2018  . Tdap 05/02/2018  . Zoster Recombinat (Shingrix) 11/27/2018    TDAP status: Up to date Flu Vaccine status: Up to date Pneumococcal vaccine status: Up to date Covid-19 vaccine status: Completed vaccines    Screening Tests Health Maintenance  Topic Date Due  . DEXA SCAN  Never done  . COLONOSCOPY  05/25/2014  . PNA vac Low Risk Adult (2 of 2 - PCV13) 05/03/2019  . MAMMOGRAM  10/23/2019  . INFLUENZA VACCINE  11/23/2019  . TETANUS/TDAP  05/02/2028  . COVID-19 Vaccine  Completed  . Hepatitis C Screening  Completed    Health Maintenance  Health Maintenance Due  Topic Date Due  . DEXA SCAN  Never done  . COLONOSCOPY  05/25/2014  . PNA vac Low Risk Adult (2 of 2 - PCV13) 05/03/2019    Colon cancer screening:pt will discuss w/ PCP Mammogram status: Ordered today.Marland Kitchen Pt provided with contact info and advised to call to schedule appt.  Bone Density status: Ordered today. Pt provided with contact info and advised to call to schedule appt.  Lung Cancer Screening:  (Low Dose CT Chest recommended if Age 43-80 years, 30 pack-year currently smoking OR have quit w/in 15years.) does not qualify.    Additional Screening:  Vision Screening: Recommended annual ophthalmology exams for early detection of glaucoma and other disorders of the eye. Is the patient up to date with their annual eye exam?  Yes per pt. Who is the provider or what is the name of the office in which the patient attends annual eye exams? Pt can't recall name.   Dental Screening: Recommended annual dental exams for proper oral hygiene  Community Resource Referral / Chronic Care Management: CRR required this visit?  No   CCM required this visit?  No      Plan:    Please schedule your next medicare wellness visit with me in 1 yr.  Continue to eat heart healthy diet (full of fruits, vegetables, whole grains, lean protein, water--limit salt, fat, and sugar intake) and increase physical activity as tolerated.  Continue doing brain stimulating activities (puzzles, reading, adult coloring books, staying active) to keep memory sharp.   I have ordered Mammogram and bone desity scan.  I have personally reviewed and noted the following in the patient's chart:   . Medical and social history . Use of alcohol, tobacco or illicit drugs  . Current medications and supplements . Functional ability and status . Nutritional status . Physical activity . Advanced directives . List of other physicians . Hospitalizations, surgeries, and ER visits in previous 12 months . Vitals . Screenings to include cognitive, depression, and falls . Referrals and appointments  In addition, I have reviewed and discussed with patient certain preventive protocols, quality metrics, and best practice recommendations. A written personalized care plan for preventive services as well as general preventive health recommendations were provided to patient.     Shela Nevin, [place]   10/15/2019   Nurse Notes:  Enjoys playing piano and being active in church.

## 2019-10-15 ENCOUNTER — Ambulatory Visit (INDEPENDENT_AMBULATORY_CARE_PROVIDER_SITE_OTHER): Payer: Medicare Other | Admitting: Family Medicine

## 2019-10-15 ENCOUNTER — Other Ambulatory Visit: Payer: Self-pay

## 2019-10-15 ENCOUNTER — Encounter: Payer: Self-pay | Admitting: Family Medicine

## 2019-10-15 ENCOUNTER — Encounter: Payer: Self-pay | Admitting: *Deleted

## 2019-10-15 ENCOUNTER — Ambulatory Visit (INDEPENDENT_AMBULATORY_CARE_PROVIDER_SITE_OTHER): Payer: Medicare Other | Admitting: *Deleted

## 2019-10-15 VITALS — BP 139/82 | HR 69 | Temp 97.1°F | Ht 63.0 in | Wt 157.8 lb

## 2019-10-15 DIAGNOSIS — Z1211 Encounter for screening for malignant neoplasm of colon: Secondary | ICD-10-CM | POA: Diagnosis not present

## 2019-10-15 DIAGNOSIS — M7989 Other specified soft tissue disorders: Secondary | ICD-10-CM | POA: Diagnosis not present

## 2019-10-15 DIAGNOSIS — R635 Abnormal weight gain: Secondary | ICD-10-CM

## 2019-10-15 DIAGNOSIS — M792 Neuralgia and neuritis, unspecified: Secondary | ICD-10-CM

## 2019-10-15 DIAGNOSIS — Z780 Asymptomatic menopausal state: Secondary | ICD-10-CM

## 2019-10-15 DIAGNOSIS — Z1231 Encounter for screening mammogram for malignant neoplasm of breast: Secondary | ICD-10-CM

## 2019-10-15 DIAGNOSIS — Z0000 Encounter for general adult medical examination without abnormal findings: Secondary | ICD-10-CM | POA: Diagnosis not present

## 2019-10-15 NOTE — Patient Instructions (Signed)
[name] , Thank you for taking time to come for your Medicare Wellness Visit. I appreciate your ongoing commitment to your health goals. Please review the following plan we discussed and let me know if I can assist you in the future.   Screening recommendations/referrals: Colon cancer screening:pt will discuss w/ PCP today Mammogram status: Ordered today.Marland Kitchen Pt provided with contact info and advised to call to schedule appt.  Bone Density status: Ordered today. Pt provided with contact info and advised to call to schedule appt. Recommended yearly ophthalmology/optometry visit for glaucoma screening and checkup Recommended yearly dental visit for hygiene and checkup  Vaccinations: TDAP status: Up to date Flu Vaccine status: Up to date Pneumococcal vaccine status: Up to date Covid-19 vaccine status: Completed vaccines  Advanced directives: Bring a copy of your living will and/or healthcare power of attorney to your next office visit.  Next appointment: Follow up in one year for your annual wellness visit    Preventive Care 65 Years and Older, Female Preventive care refers to lifestyle choices and visits with your health care provider that can promote health and wellness. What does preventive care include?  A yearly physical exam. This is also called an annual well check.  Dental exams once or twice a year.  Routine eye exams. Ask your health care provider how often you should have your eyes checked.  Personal lifestyle choices, including:  Daily care of your teeth and gums.  Regular physical activity.  Eating a healthy diet.  Avoiding tobacco and drug use.  Limiting alcohol use.  Practicing safe sex.  Taking low-dose aspirin every day.  Taking vitamin and mineral supplements as recommended by your health care provider. What happens during an annual well check? The services and screenings done by your health care provider during your annual well check will depend on  your age, overall health, lifestyle risk factors, and family history of disease. Counseling  Your health care provider may ask you questions about your:  Alcohol use.  Tobacco use.  Drug use.  Emotional well-being.  Home and relationship well-being.  Sexual activity.  Eating habits.  History of falls.  Memory and ability to understand (cognition).  Work and work Statistician.  Reproductive health. Screening  You may have the following tests or measurements:  Height, weight, and BMI.  Blood pressure.  Lipid and cholesterol levels. These may be checked every 5 years, or more frequently if you are over 58 years old.  Skin check.  Lung cancer screening. You may have this screening every year starting at age 59 if you have a 30-pack-year history of smoking and currently smoke or have quit within the past 15 years.  Fecal occult blood test (FOBT) of the stool. You may have this test every year starting at age 78.  Flexible sigmoidoscopy or colonoscopy. You may have a sigmoidoscopy every 5 years or a colonoscopy every 10 years starting at age 62.  Hepatitis C blood test.  Hepatitis B blood test.  Sexually transmitted disease (STD) testing.  Diabetes screening. This is done by checking your blood sugar (glucose) after you have not eaten for a while (fasting). You may have this done every 1-3 years.  Bone density scan. This is done to screen for osteoporosis. You may have this done starting at age 43.  Mammogram. This may be done every 1-2 years. Talk to your health care provider about how often you should have regular mammograms. Talk with your health care provider about your test results,  treatment options, and if necessary, the need for more tests. Vaccines  Your health care provider may recommend certain vaccines, such as:  Influenza vaccine. This is recommended every year.  Tetanus, diphtheria, and acellular pertussis (Tdap, Td) vaccine. You may need a Td booster  every 10 years.  Zoster vaccine. You may need this after age 69.  Pneumococcal 13-valent conjugate (PCV13) vaccine. One dose is recommended after age 82.  Pneumococcal polysaccharide (PPSV23) vaccine. One dose is recommended after age 17. Talk to your health care provider about which screenings and vaccines you need and how often you need them. This information is not intended to replace advice given to you by your health care provider. Make sure you discuss any questions you have with your health care provider. Document Released: 05/07/2015 Document Revised: 12/29/2015 Document Reviewed: 02/09/2015 Elsevier Interactive Patient Education  2017 [employer] Prevention in the Home Falls can cause injuries. They can happen to people of all ages. There are many things you can do to make your home safe and to help prevent falls. What can I do on the outside of my home?  Regularly fix the edges of walkways and driveways and fix any cracks.  Remove anything that might make you trip as you walk through a door, such as a raised step or threshold.  Trim any bushes or trees on the path to your home.  Use bright outdoor lighting.  Clear any walking paths of anything that might make someone trip, such as rocks or tools.  Regularly check to see if handrails are loose or broken. Make sure that both sides of any steps have handrails.  Any raised decks and porches should have guardrails on the edges.  Have any leaves, snow, or ice cleared regularly.  Use sand or salt on walking paths during winter.  Clean up any spills in your garage right away. This includes oil or grease spills. What can I do in the bathroom?  Use night lights.  Install grab bars by the toilet and in the tub and shower. Do not use towel bars as grab bars.  Use non-skid mats or decals in the tub or shower.  If you need to sit down in the shower, use a plastic, non-slip stool.  Keep the floor dry. Clean up any  water that spills on the floor as soon as it happens.  Remove soap buildup in the tub or shower regularly.  Attach bath mats securely with double-sided non-slip rug tape.  Do not have throw rugs and other things on the floor that can make you trip. What can I do in the bedroom?  Use night lights.  Make sure that you have a light by your bed that is easy to reach.  Do not use any sheets or blankets that are too big for your bed. They should not hang down onto the floor.  Have a firm chair that has side arms. You can use this for support while you get dressed.  Do not have throw rugs and other things on the floor that can make you trip. What can I do in the kitchen?  Clean up any spills right away.  Avoid walking on wet floors.  Keep items that you use a lot in easy-to-reach places.  If you need to reach something above you, use a strong step stool that has a grab bar.  Keep electrical cords out of the way.  Do not use floor polish or wax that makes floors  slippery. If you must use wax, use non-skid floor wax.  Do not have throw rugs and other things on the floor that can make you trip. What can I do with my stairs?  Do not leave any items on the stairs.  Make sure that there are handrails on both sides of the stairs and use them. Fix handrails that are broken or loose. Make sure that handrails are as long as the stairways.  Check any carpeting to make sure that it is firmly attached to the stairs. Fix any carpet that is loose or worn.  Avoid having throw rugs at the top or bottom of the stairs. If you do have throw rugs, attach them to the floor with carpet tape.  Make sure that you have a light switch at the top of the stairs and the bottom of the stairs. If you do not have them, ask someone to add them for you. What else can I do to help prevent falls?  Wear shoes that:  Do not have high heels.  Have rubber bottoms.  Are comfortable and fit you well.  Are closed  at the toe. Do not wear sandals.  If you use a stepladder:  Make sure that it is fully opened. Do not climb a closed stepladder.  Make sure that both sides of the stepladder are locked into place.  Ask someone to hold it for you, if possible.  Clearly mark and make sure that you can see:  Any grab bars or handrails.  First and last steps.  Where the edge of each step is.  Use tools that help you move around (mobility aids) if they are needed. These include:  Canes.  Walkers.  Scooters.  Crutches.  Turn on the lights when you go into a dark area. Replace any light bulbs as soon as they burn out.  Set up your furniture so you have a clear path. Avoid moving your furniture around.  If any of your floors are uneven, fix them.  If there are any pets around you, be aware of where they are.  Review your medicines with your doctor. Some medicines can make you feel dizzy. This can increase your chance of falling. Ask your doctor what other things that you can do to help prevent falls. This information is not intended to replace advice given to you by your health care provider. Make sure you discuss any questions you have with your health care provider. Document Released: 02/04/2009 Document Revised: 09/16/2015 Document Reviewed: 05/15/2014 Elsevier Interactive Patient Education  2017 [employer].

## 2019-10-15 NOTE — Patient Instructions (Addendum)
Call Dr. Serita Grit office regarding your feet burning.   Find out if you have been taking Cymbalta (duloxetine) at home. If you have been taking it, then it has not working and we shall stop. If you have not been taking it, I want to see what it does and request you start taking it daily.  Give [place] 2-3 business days to get the results of your labs back.   If labs are normal and you aren't better in 1 week, please send a message and we will do an ultrasound of your heart.   Let us know if you need anything.

## 2019-10-15 NOTE — Progress Notes (Signed)
CC: f/u  Subjective: Patient is a 74 y.o. female here for f/u.  Swelling over past couple weeks. No pain. Feels like cement in legs. No SOB or cough. Has been elevating legs, no change in diet. Has been exercising routinely. Has compression stockings but they are pretty tight on her.   Unsure about Shingrix, does not think she got at pharmacy, I do not see that she got it here.   Has not had CCS since 2006.  Patient has continued paresthesias in her feet.  Lyrica is helpful but it does make her sleepy.  She is taking once daily.  She is unsure if she took Cymbalta but if she did, she noticed no improvement.  She saw the neurology team back in the fall with a note states she is to follow-up if she has any continuing issues.  She has not followed up thus far.  Past Medical History:  Diagnosis Date  . Allergy   . Cancer (HCC)    Breast  . Hypertension   . Meniere's disease     Objective: BP 139/82   Pulse 69   Temp (!) 97.1 F (36.2 C) (Temporal)   Ht 5\' 3"  (1.6 m)   Wt 157 lb 12 oz (71.6 kg)   SpO2 98%   BMI 27.94 kg/m  General: Awake, appears stated age HEENT: MMM, EOMi Heart: RRR, no bruits, 3+ pitting edema in the feet tapering at the distal third of the tibia bilaterally MSK: There is no calf tenderness bilaterally, negative Homans' sign bilaterally Lungs: CTAB, no rales, wheezes or rhonchi. No accessory muscle use Psych: Age appropriate judgment and insight, normal affect and mood  Assessment and Plan: Localized swelling of both lower extremities - Plan: Comprehensive metabolic panel, TSH, CBC, CANCELED: Comprehensive metabolic panel, CANCELED: CBC  Screen for colon cancer - Plan: Ambulatory referral to Gastroenterology  Weight gain - Plan: Comprehensive metabolic panel, TSH, CBC, CANCELED: TSH  Neuropathic pain - Plan: Comprehensive metabolic panel, TSH, CBC  1-check above labs.  Elevation, activity as tolerated, mind salt intake, consider compression.  If no  improvement over the next week and labs are normal, will consider echo. 2-referral to GI placed 3-check above 4-if she has not been taking Cymbalta, she needs to start taking it.  If she has been taking it, we will stop as it has not been effective.  I am encouraging her to reach out to Dr. Serita Grit office to see if there is any other recommendation they have. Follow-up as originally scheduled. The patient voiced understanding and agreement to the plan.  Mora, DO 10/15/19  4:39 PM

## 2019-10-16 ENCOUNTER — Other Ambulatory Visit (INDEPENDENT_AMBULATORY_CARE_PROVIDER_SITE_OTHER): Payer: Medicare Other

## 2019-10-16 DIAGNOSIS — R635 Abnormal weight gain: Secondary | ICD-10-CM

## 2019-10-16 DIAGNOSIS — M792 Neuralgia and neuritis, unspecified: Secondary | ICD-10-CM | POA: Diagnosis not present

## 2019-10-16 DIAGNOSIS — M7989 Other specified soft tissue disorders: Secondary | ICD-10-CM

## 2019-10-16 LAB — CBC
HCT: 40.1 % (ref 36.0–46.0)
Hemoglobin: 14.1 g/dL (ref 12.0–15.0)
MCHC: 35.3 g/dL (ref 30.0–36.0)
MCV: 98.2 fl (ref 78.0–100.0)
Platelets: 225 10*3/uL (ref 150.0–400.0)
RBC: 4.08 Mil/uL (ref 3.87–5.11)
RDW: 12 % (ref 11.5–15.5)
WBC: 4.8 10*3/uL (ref 4.0–10.5)

## 2019-10-16 LAB — COMPREHENSIVE METABOLIC PANEL
ALT: 27 U/L (ref 0–35)
AST: 33 U/L (ref 0–37)
Albumin: 4.6 g/dL (ref 3.5–5.2)
Alkaline Phosphatase: 57 U/L (ref 39–117)
BUN: 15 mg/dL (ref 6–23)
CO2: 27 mEq/L (ref 19–32)
Calcium: 9.7 mg/dL (ref 8.4–10.5)
Chloride: 97 mEq/L (ref 96–112)
Creatinine, Ser: 0.98 mg/dL (ref 0.40–1.20)
GFR: 55.37 mL/min — ABNORMAL LOW (ref >60.00)
Glucose, Bld: 125 mg/dL — ABNORMAL HIGH (ref 70–99)
Potassium: 4.1 mEq/L (ref 3.5–5.1)
Sodium: 138 mEq/L (ref 135–145)
Total Bilirubin: 0.5 mg/dL (ref 0.2–1.2)
Total Protein: 6.9 g/dL (ref 6.0–8.3)

## 2019-10-16 LAB — TSH: TSH: 1.61 u[IU]/mL (ref 0.35–4.50)

## 2019-10-17 ENCOUNTER — Other Ambulatory Visit: Payer: Self-pay | Admitting: Family Medicine

## 2019-10-17 DIAGNOSIS — B002 Herpesviral gingivostomatitis and pharyngotonsillitis: Secondary | ICD-10-CM

## 2019-10-21 ENCOUNTER — Encounter (HOSPITAL_BASED_OUTPATIENT_CLINIC_OR_DEPARTMENT_OTHER): Payer: Self-pay

## 2019-10-21 ENCOUNTER — Ambulatory Visit (HOSPITAL_BASED_OUTPATIENT_CLINIC_OR_DEPARTMENT_OTHER)
Admission: RE | Admit: 2019-10-21 | Discharge: 2019-10-21 | Disposition: A | Discharge status: Home / Self Care | Payer: Medicare Other | Source: Ambulatory Visit | Attending: Family Medicine | Admitting: Family Medicine

## 2019-10-21 ENCOUNTER — Other Ambulatory Visit (HOSPITAL_BASED_OUTPATIENT_CLINIC_OR_DEPARTMENT_OTHER): Payer: Self-pay | Admitting: Family Medicine

## 2019-10-21 ENCOUNTER — Other Ambulatory Visit: Payer: Self-pay

## 2019-10-21 DIAGNOSIS — Z1231 Encounter for screening mammogram for malignant neoplasm of breast: Secondary | ICD-10-CM | POA: Diagnosis present

## 2019-10-21 DIAGNOSIS — Z780 Asymptomatic menopausal state: Secondary | ICD-10-CM | POA: Insufficient documentation

## 2019-10-21 IMAGING — MG DIGITAL SCREENING BILAT W/ TOMO W/ CAD
6 of 10 series · 6 of 30 positions shown · non-contrast
Comparison: None.

ACR Breast Density Category a: The breast tissue is almost entirely
fatty.

CLINICAL DATA: Screening.

EXAM:
DIGITAL SCREENING BILATERAL MAMMOGRAM WITH TOMO AND CAD

[R CC synth-2D]
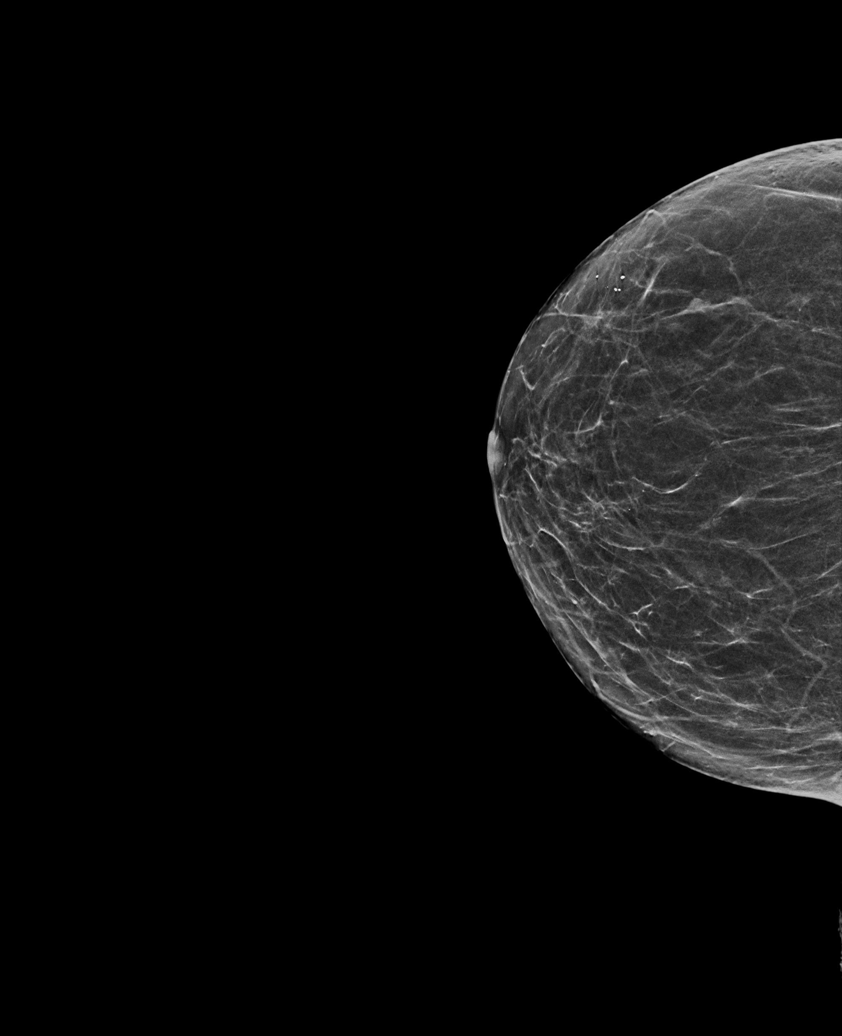

[R MLO synth-2D]
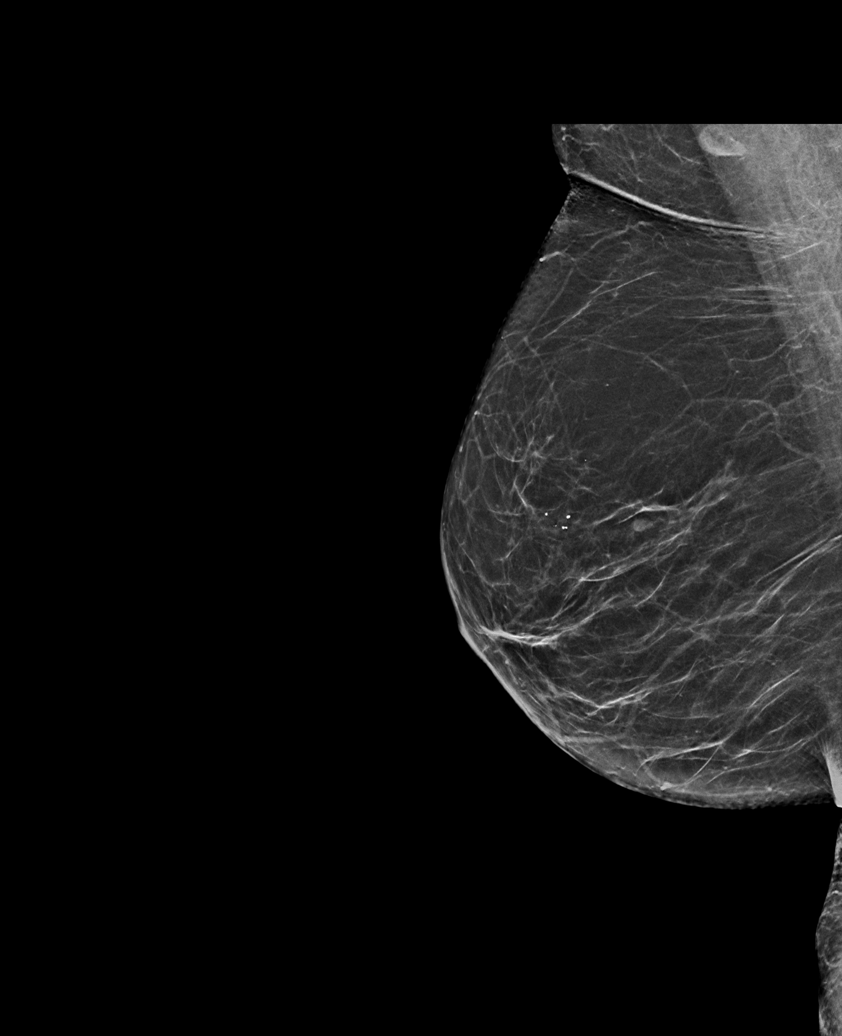

[L CC synth-2D]
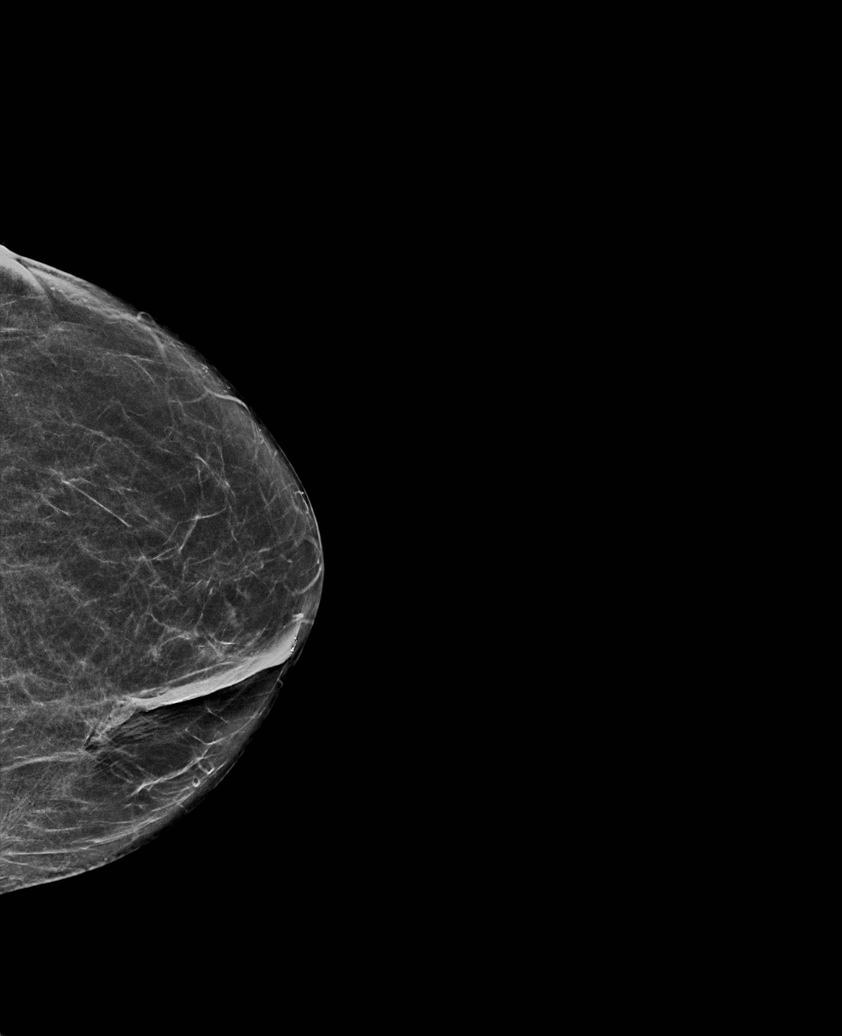

[L MLO synth-2D]
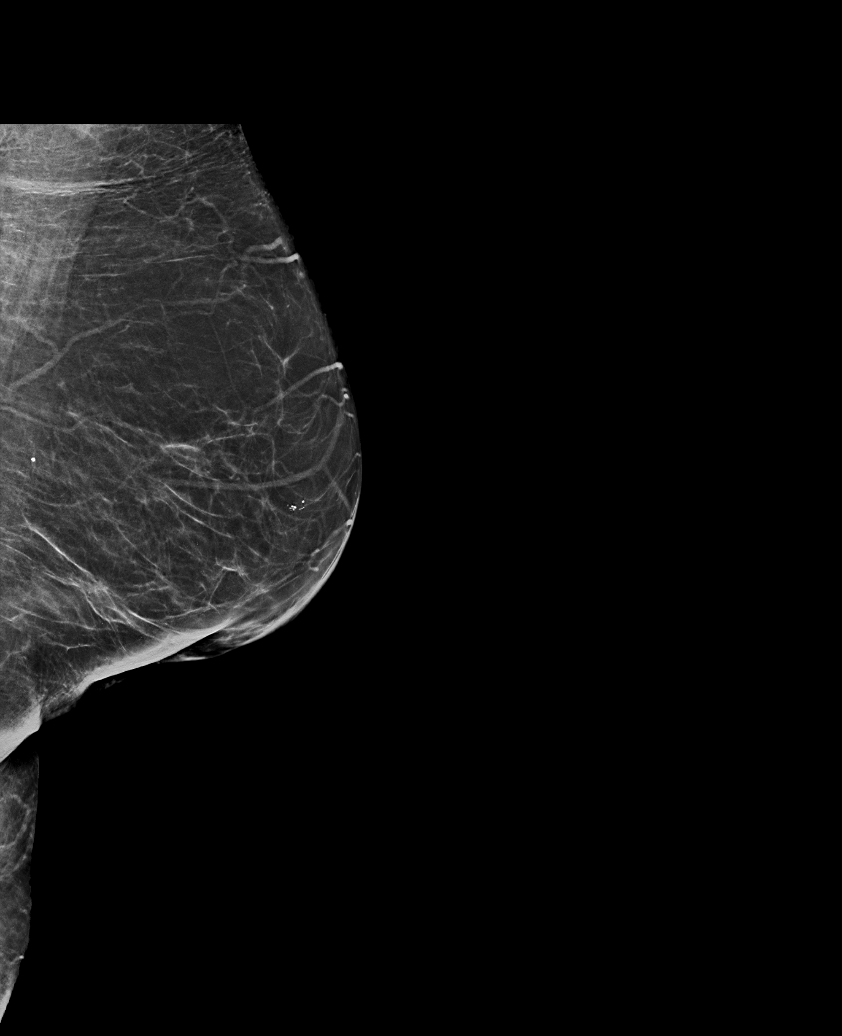

[L XCCM synth-2D]
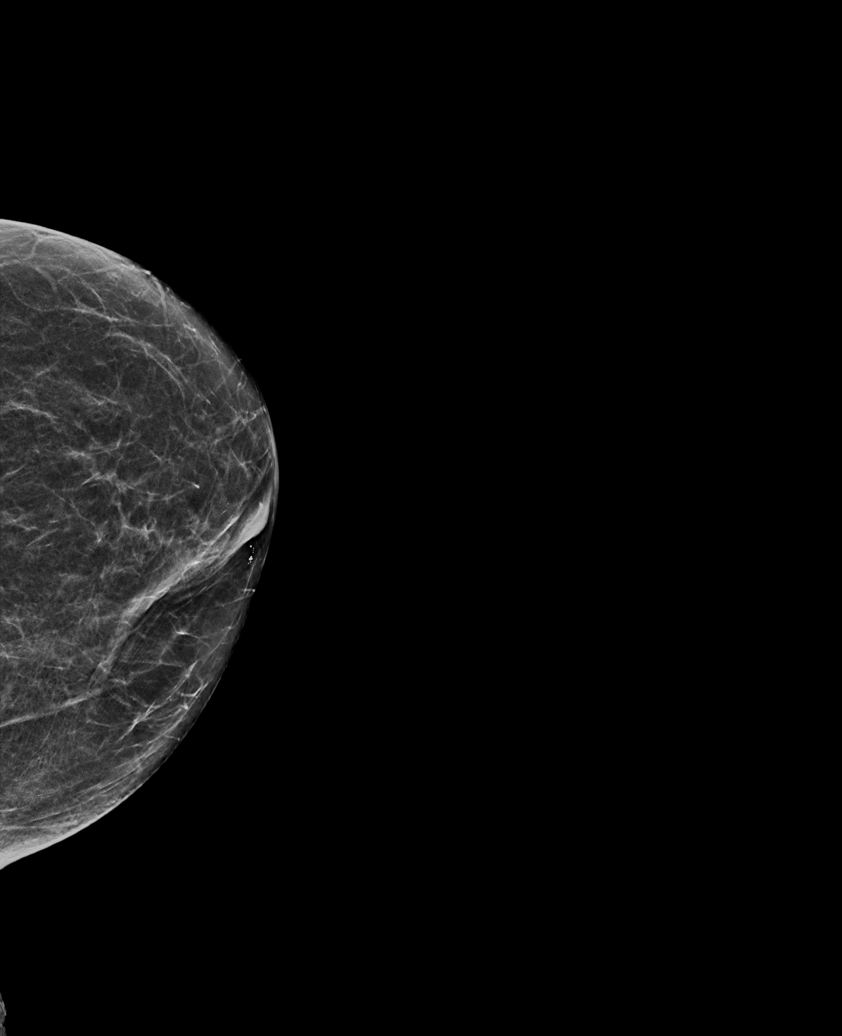

[L XCCM BREAST TOMOSYNTHESIS IMAGE tomo · tomo slice 25/48.0]
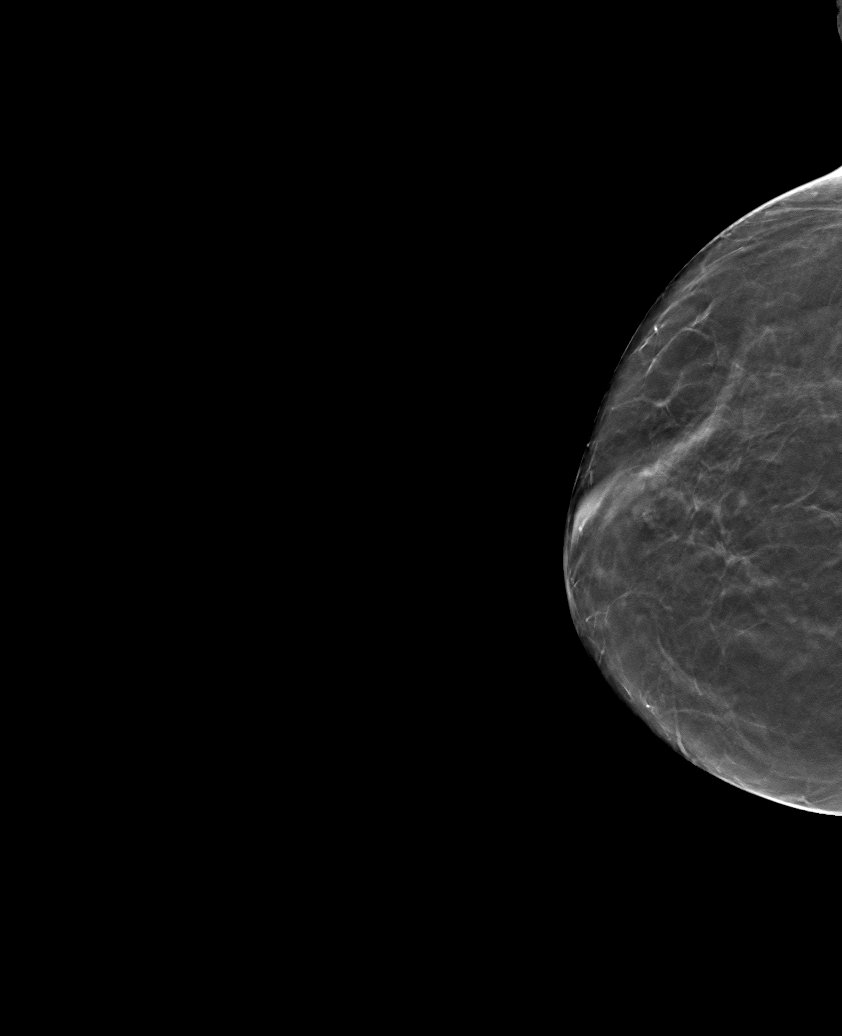

[6 of 30 positions shown; findings below may reference images not displayed]

FINDINGS: There are no findings suspicious for malignancy. Post lumpectomy
changes on the left are noted. Images were processed with CAD.
IMPRESSION: No mammographic evidence of malignancy. A result letter of this
screening mammogram will be mailed directly to the patient.

RECOMMENDATION:
Screening mammogram in one year. (Code:[Q5])

BI-RADS CATEGORY  2: Benign.

## 2019-11-01 ENCOUNTER — Other Ambulatory Visit: Payer: Self-pay | Admitting: Family Medicine

## 2019-11-26 DIAGNOSIS — H26492 Other secondary cataract, left eye: Secondary | ICD-10-CM | POA: Diagnosis not present

## 2019-12-09 DIAGNOSIS — H26492 Other secondary cataract, left eye: Secondary | ICD-10-CM | POA: Diagnosis not present

## 2019-12-09 DIAGNOSIS — Z961 Presence of intraocular lens: Secondary | ICD-10-CM | POA: Diagnosis not present

## 2019-12-09 DIAGNOSIS — H02831 Dermatochalasis of right upper eyelid: Secondary | ICD-10-CM | POA: Diagnosis not present

## 2019-12-09 DIAGNOSIS — H18413 Arcus senilis, bilateral: Secondary | ICD-10-CM | POA: Diagnosis not present

## 2019-12-12 ENCOUNTER — Other Ambulatory Visit: Payer: Self-pay | Admitting: Family Medicine

## 2019-12-18 ENCOUNTER — Other Ambulatory Visit: Payer: Self-pay | Admitting: Family Medicine

## 2019-12-18 DIAGNOSIS — R202 Paresthesia of skin: Secondary | ICD-10-CM

## 2020-01-01 DIAGNOSIS — L738 Other specified follicular disorders: Secondary | ICD-10-CM | POA: Diagnosis not present

## 2020-01-01 DIAGNOSIS — L0889 Other specified local infections of the skin and subcutaneous tissue: Secondary | ICD-10-CM | POA: Diagnosis not present

## 2020-01-02 ENCOUNTER — Other Ambulatory Visit: Payer: Self-pay | Admitting: Family Medicine

## 2020-01-02 DIAGNOSIS — R202 Paresthesia of skin: Secondary | ICD-10-CM

## 2020-01-02 DIAGNOSIS — B002 Herpesviral gingivostomatitis and pharyngotonsillitis: Secondary | ICD-10-CM

## 2020-01-14 ENCOUNTER — Encounter: Payer: Self-pay | Admitting: Gastroenterology

## 2020-01-23 ENCOUNTER — Encounter: Payer: Self-pay | Admitting: Neurology

## 2020-01-23 ENCOUNTER — Ambulatory Visit (INDEPENDENT_AMBULATORY_CARE_PROVIDER_SITE_OTHER): Payer: Medicare Other | Admitting: Neurology

## 2020-01-23 ENCOUNTER — Ambulatory Visit (INDEPENDENT_AMBULATORY_CARE_PROVIDER_SITE_OTHER): Payer: Medicare Other

## 2020-01-23 ENCOUNTER — Other Ambulatory Visit: Payer: Self-pay

## 2020-01-23 ENCOUNTER — Encounter (HOSPITAL_COMMUNITY): Payer: Self-pay

## 2020-01-23 ENCOUNTER — Ambulatory Visit (HOSPITAL_COMMUNITY)
Admission: EM | Admit: 2020-01-23 | Discharge: 2020-01-23 | Disposition: A | Discharge status: Home / Self Care | Payer: Medicare Other | Attending: Internal Medicine | Admitting: Internal Medicine

## 2020-01-23 VITALS — BP 131/79 | HR 75 | Ht 62.0 in | Wt 156.0 lb

## 2020-01-23 DIAGNOSIS — R2242 Localized swelling, mass and lump, left lower limb: Secondary | ICD-10-CM | POA: Diagnosis not present

## 2020-01-23 DIAGNOSIS — R202 Paresthesia of skin: Secondary | ICD-10-CM

## 2020-01-23 DIAGNOSIS — M79672 Pain in left foot: Secondary | ICD-10-CM

## 2020-01-23 DIAGNOSIS — M7989 Other specified soft tissue disorders: Secondary | ICD-10-CM

## 2020-01-23 DIAGNOSIS — S92325A Nondisplaced fracture of second metatarsal bone, left foot, initial encounter for closed fracture: Secondary | ICD-10-CM

## 2020-01-23 IMAGING — DX DG FOOT COMPLETE 3+V*L*
3 series · 3 of 3 positions shown · non-contrast
Comparison: None.

CLINICAL DATA: Left foot pain.

EXAM:
LEFT FOOT - COMPLETE 3+ VIEW

[foot ap]
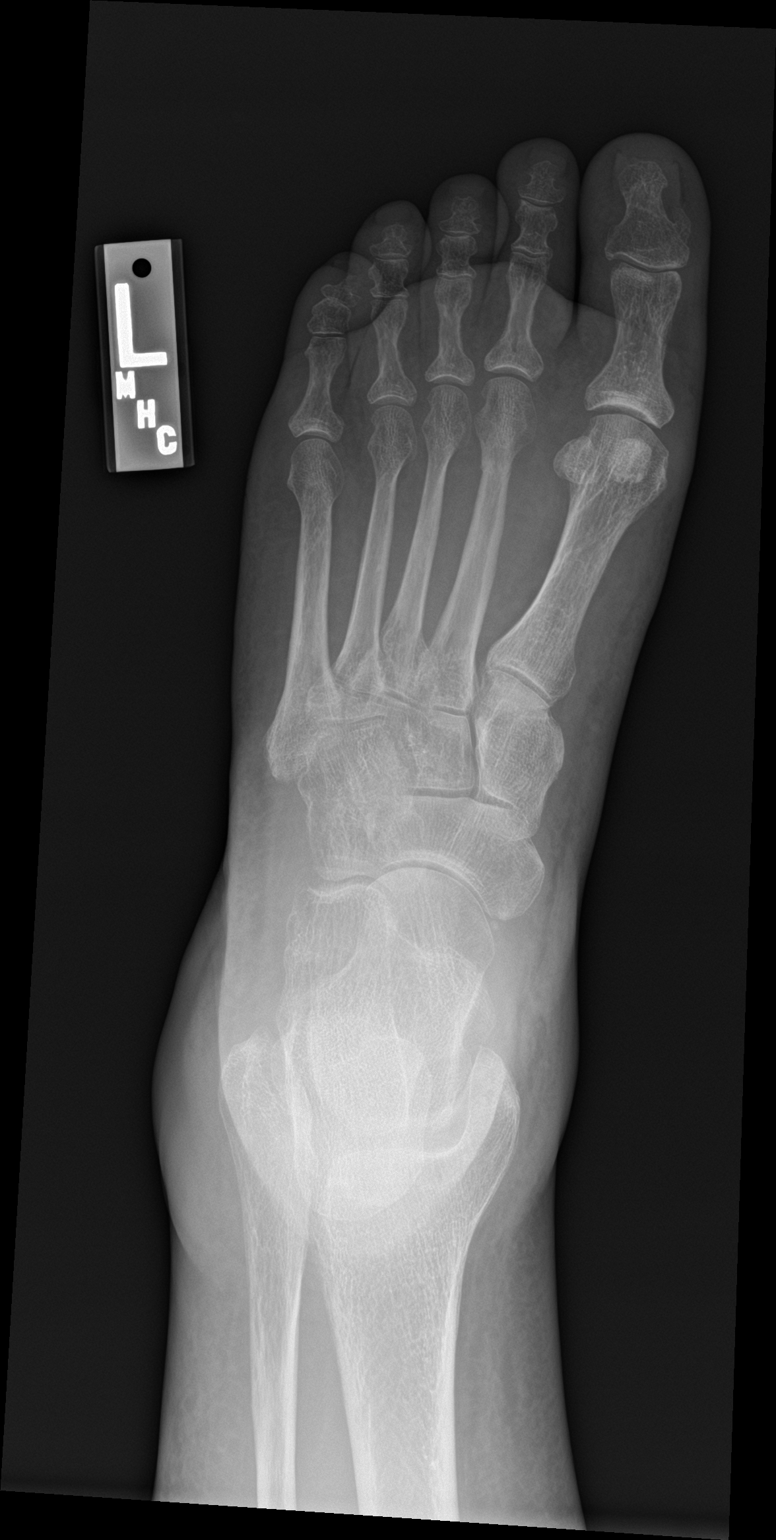

[foot obl]
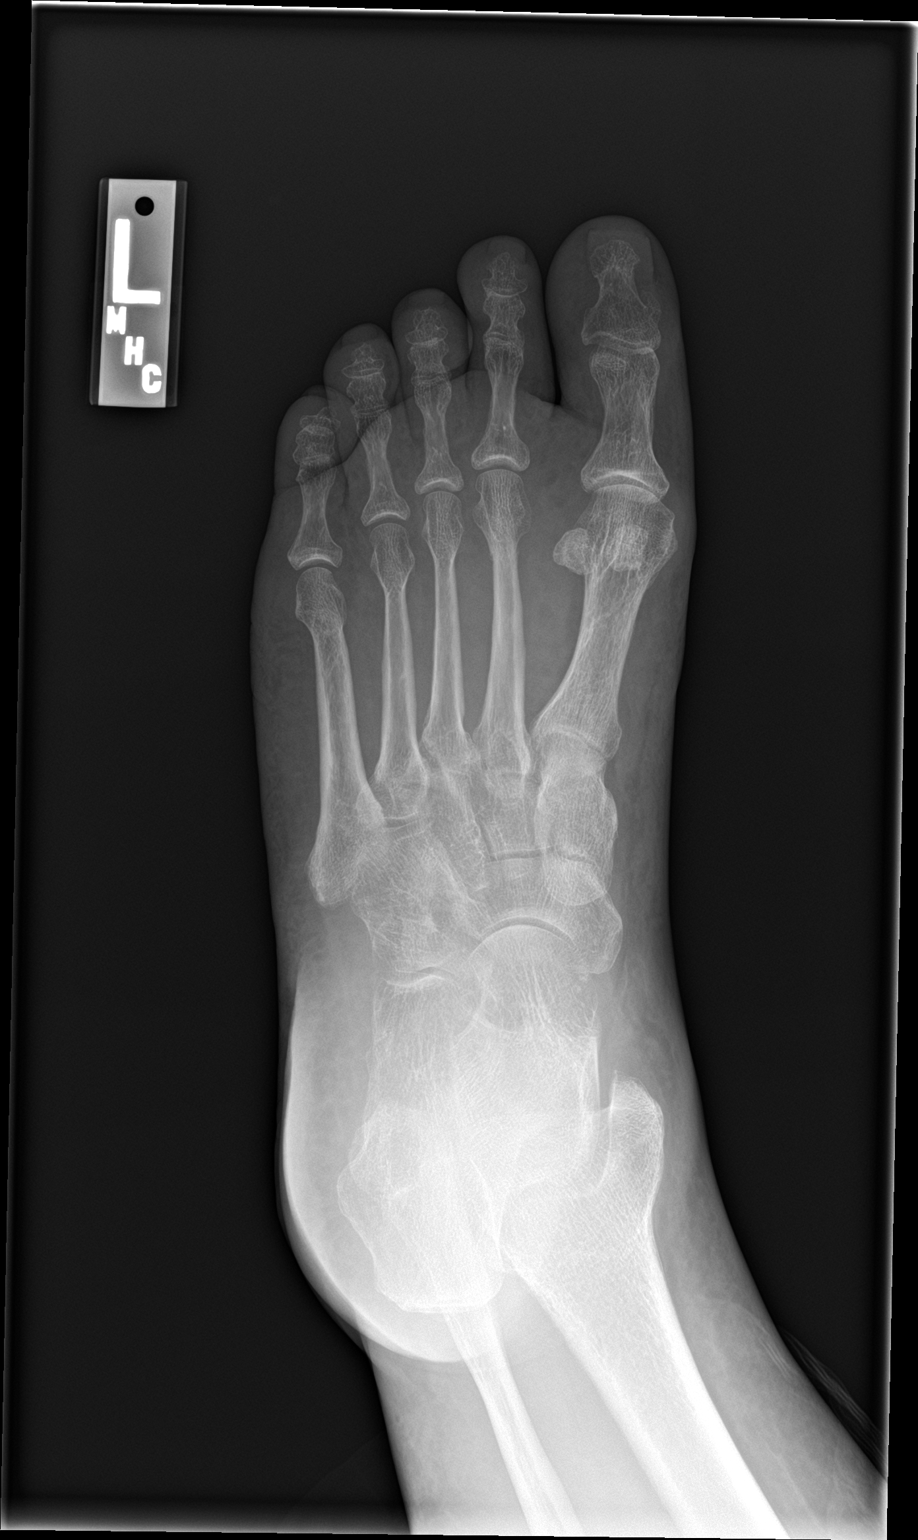

[foot lat]
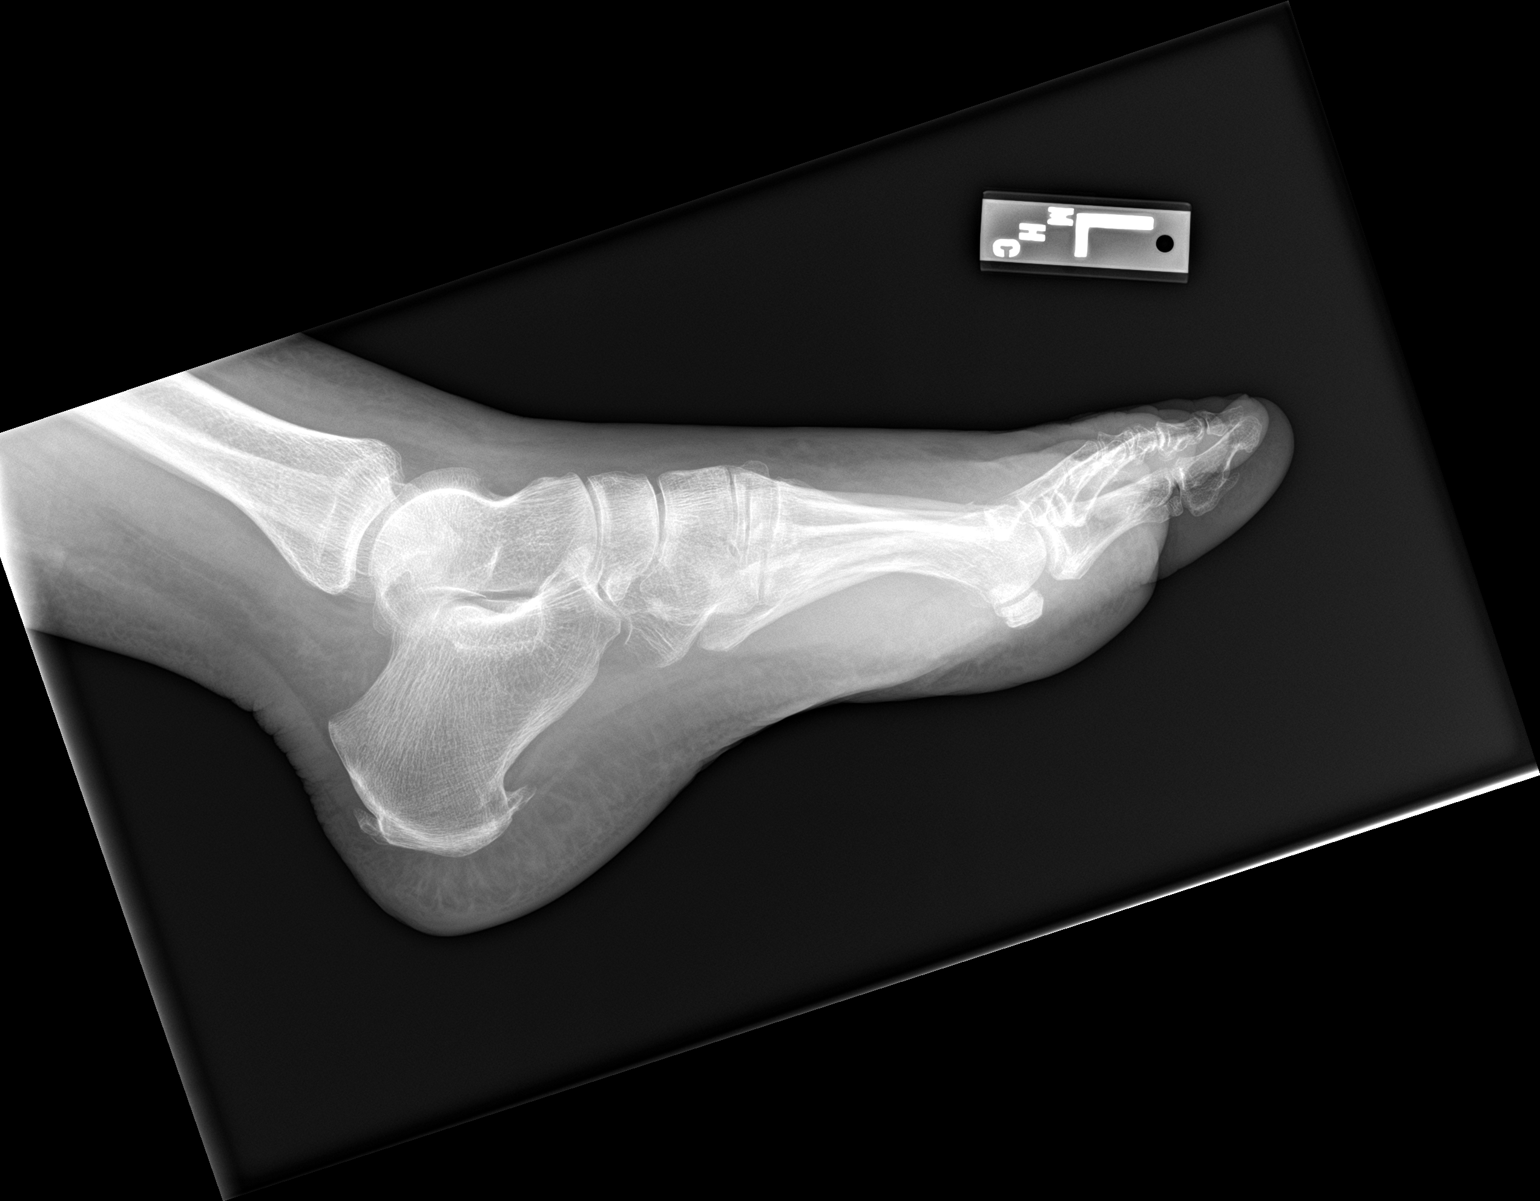

[3 of 3 positions shown; findings below may reference images not displayed]

FINDINGS: A nondisplaced fracture deformity is seen involving the distal
aspect of the second left metatarsal. There is no evidence of
dislocation. Mild soft tissue swelling is seen along the dorsal
aspect of the left foot.
IMPRESSION: Nondisplaced fracture of the second left metatarsal.

## 2020-01-23 MED ORDER — DULOXETINE HCL 60 MG PO CPEP: 60.0000 mg | ORAL_CAPSULE | Freq: Every day | ORAL | 3 refills | Status: DC

## 2020-01-23 NOTE — Patient Instructions (Addendum)
Increase Cymbalta 60mg  daily   Go to urgent care to have your left foot pain and swelling evaluated  [facility] [place], [place], [place] [address] [phone]

## 2020-01-23 NOTE — Progress Notes (Signed)
Follow-up Visit   Date: 01/23/20   Carolyn Mckinney MRN: [id number] DOB: [birth year]   Interim History: Carolyn Mckinney is a 74 y.o. left-handed Caucasian female returning to the clinic for follow-up of bilateral feet pain and new complaints of left foot swelling.  The patient was accompanied to the clinic by self.  She has ongoing burning and sharp pain over the feet and soles, despite being on Lyrica 150mg  BID and Cymbalta 30mg  daily.  Her NCS/EMG was normal. She has tried gabapentin, nortriptyline, and venlafaxine with no benefit.   Her main complaint today is new left foot pain and swelling. Over the past 4 days, she developed acute onset of severe throbbing pain and soreness over the left distal foot, which is worse with standing. She has difficulty walking and is using a cane. She went to [place] with friends and on Monday evening, started having symptoms.    Medications:  Current Outpatient Medications on File Prior to Visit  Medication Sig Dispense Refill  . amLODipine (NORVASC) 5 MG tablet Take 1 tablet by mouth once daily 90 tablet 0  . DULoxetine (CYMBALTA) 30 MG capsule Take 1 capsule by mouth once daily 30 capsule 0  . losartan (COZAAR) 100 MG tablet Take 1 tablet by mouth once daily 90 tablet 0  . pregabalin (LYRICA) 150 MG capsule Take 1 capsule by mouth twice daily 60 capsule 5  . traZODone (DESYREL) 50 MG tablet TAKE 1 TO 2 TABLETS BY MOUTH ONCE DAILY AT BEDTIME AS NEEDED FOR SLEEP 60 tablet 5  . triamterene-hydrochlorothiazide (MAXZIDE-25) 37.5-25 MG tablet Take 1 tablet by mouth daily. 90 tablet 1  . valACYclovir (VALTREX) 500 MG tablet TAKE 4 TABLETS BY MOUTH AT ONSET OF A BREAKOUT AND REPEAT IN 12 HOURS. TAKE 1 TABLET BY MOUTH DAILY. 90 tablet 0  . valsartan (DIOVAN) 320 MG tablet Take 1 tablet (320 mg total) by mouth daily. 90 tablet 1   No current facility-administered medications on file prior to visit.    Allergies: No Known Allergies  Vital Signs:   BP 131/79   Pulse 75   Ht 5\' 2"  (1.575 m)   Wt 156 lb (70.8 kg)   SpO2 99%   BMI 28.53 kg/m   General: Left foot and lower leg is swollen, tender to palpation, with reproduction of pain with toe and ankle movement.   Neurological Exam: MENTAL STATUS including orientation to time, place, person is normal.  Speech is slow and there is delayed throught process. No dysarthria  CRANIAL NERVES:  Pupils equal round and reactive to light.  Normal conjugate, extra-ocular eye movements in all directions of gaze.  No ptosis.    MOTOR:  Motor strength is 5/5 in all extremities, including distally in the feet.  No atrophy, fasciculations or abnormal movements.  No pronator drift.  Tone is normal.    MSRs:  Reflexes are 2+/4 throughout.  SENSORY:  Intact to vibration throughout.  COORDINATION/GAIT:  Gait is antalgic due to left foot pain, assisted with a cane  Data: NCS/EMG bilateral legs 06/08/2018:  Normal   IMPRESSION/PLAN: 1.  Left foot pain and swelling - new.  No trauma to suggest fracture  - Recommend that she go to urgent care for expedited evaluation.   - With her travel history, DVT needs to be excluded   2.  Bilateral feet paresthesias and pain  - No evidence of large fiber neuropathy on EMG  - Continue Lyrica 150mg  BID for pain  - Increase Cymbalta  to 60mg  daily  - If no improvement, refer to pain management   Thank you for allowing me to participate in patient's care.  If I can answer any additional questions, I would be pleased to do so.    Sincerely,    Maleik Vanderzee K. Posey Pronto, DO

## 2020-01-23 NOTE — ED Triage Notes (Signed)
Pt presents with left foot pain and swelling since Monday.

## 2020-01-23 NOTE — ED Provider Notes (Signed)
[facility]    CSN: [id number] Arrival date & time: 01/23/20  1617      History   Chief Complaint Chief Complaint  Patient presents with   Foot Pain    HPI Carolyn Mckinney is a 74 y.o. female with past medical history of hypertension, Mnire's disease, bilateral foot paresthesias followed by neurology presents to urgent care with complaints of left foot pain. Patient reports progressive pain and swelling to left foot x1 week. Patient states symptoms began while on trip to [place] where she was doing quite a bit of walking. She denies any injury or trauma. Patient able to bear weight though significant discomfort on dorsal aspect of foot. Patient reports 2-year history of burning, numbness and tingling on bottom of feet for which she follows with neurology. Her medications were adjusted for this today. She was able to walk into clinic using cane today.   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      Home Medications    Prior to Admission medications   Medication Sig Start Date End Date Taking? Authorizing Provider  amLODipine (NORVASC) 5 MG tablet Take 1 tablet by mouth once daily 10/17/19   Shelda Pal, DO  DULoxetine (CYMBALTA) 60 MG capsule Take 1 capsule (60 mg total) by  mouth daily. 01/23/20   Narda Amber K, DO  losartan (COZAAR) 100 MG tablet Take 1 tablet by mouth once daily 10/17/19   Shelda Pal, DO  pregabalin (LYRICA) 150 MG capsule Take 1 capsule by mouth twice daily 07/17/19   Shelda Pal, DO  traZODone (DESYREL) 50 MG tablet TAKE 1 TO 2 TABLETS BY MOUTH ONCE DAILY AT BEDTIME AS NEEDED FOR SLEEP 07/28/19   Wendling, Crosby Oyster, DO  triamterene-hydrochlorothiazide (MAXZIDE-25) 37.5-25 MG tablet Take 1 tablet by mouth daily. 12/12/19   Shelda Pal, DO  valACYclovir (VALTREX) 500 MG tablet TAKE 4 TABLETS BY MOUTH AT ONSET OF A BREAKOUT AND REPEAT IN 12 HOURS. TAKE 1 TABLET BY MOUTH DAILY. 01/02/20   Shelda Pal, DO  valsartan (DIOVAN) 320 MG tablet Take 1 tablet (320 mg total) by mouth daily. 11/06/18   Shelda Pal, DO    Family History Family History  Problem Relation Age of Onset   Arthritis Mother    Heart disease Father     Social History Social History   Tobacco Use   Smoking status: Never Smoker   Smokeless tobacco: Never Used  Scientific laboratory technician Use: Never used  Substance Use Topics   Alcohol use: Yes    Comment: just wine/glass of wine in the evening   Drug use: Never     Allergies   Patient  has no known allergies.   Review of Systems As stated in HPI otherwise negative   Physical Exam Triage Vital Signs ED Triage Vitals  Enc Vitals Group     BP 01/23/20 1740 139/76     Pulse Rate 01/23/20 1740 74     Resp 01/23/20 1740 18     Temp 01/23/20 1740 98.4 F (36.9 C)     Temp Source 01/23/20 1740 Oral     SpO2 01/23/20 1740 98 %     Weight --      Height --      Head Circumference --      Peak Flow --      Pain Score 01/23/20 1739 9     Pain Loc --      Pain Edu? --      Excl. in Man? --    No data found.  Updated Vital Signs BP 139/76 (BP Location: Right Arm)    Pulse 74    Temp 98.4 F (36.9 C) (Oral)    Resp 18    SpO2 98%   Visual  Acuity Right Eye Distance:   Left Eye Distance:   Bilateral Distance:    Right Eye Near:   Left Eye Near:    Bilateral Near:     Physical Exam Constitutional:      General: She is not in acute distress.    Appearance: Normal appearance. She is not ill-appearing.  Musculoskeletal:        General: Swelling and tenderness present. No deformity.     Comments: Patient with fairly significant swelling to dorsal aspect of left foot. Quite tender at base of first and second metatarsal. Sensation intact, 2+ DP pulse, no erythema or ecchymosis. ROM limited by pain and swelling  Skin:    General: Skin is warm and dry.  Neurological:     Mental Status: She is alert.     UC Treatments / Results  Labs (all labs ordered are listed, but only abnormal results are displayed) Labs Reviewed - No data to display  EKG   Radiology DG Foot Complete Left  Result Date: 01/23/2020 CLINICAL DATA:  Left foot pain. EXAM: LEFT FOOT - COMPLETE 3+ VIEW COMPARISON:  None. FINDINGS: A nondisplaced fracture deformity is seen involving the distal aspect of the second left metatarsal. There is no evidence of dislocation. Mild soft tissue swelling is seen along the dorsal aspect of the left foot. IMPRESSION: Nondisplaced fracture of the second left metatarsal. Electronically Signed   By: Virgina Norfolk M.D.   On: 01/23/2020 19:07    Procedures Procedures (including critical care time)  Medications Ordered in UC Medications - No data to display  Initial Impression / Assessment and Plan / UC Course  I have reviewed the triage vital signs and the nursing notes.  Pertinent labs & imaging results that were available during my care of the patient were reviewed by me and considered in my medical decision making (see chart for details).  Fracture, left second metatarsal -nondisplaced, uncertain mechanism of injury -not safe for crutches given high fall risk. Will put in cam walker and have her follow-up with  Dr. Raeford Razor as she has seen him in the past -Considered adding meloxicam though patient already takes regular Aleve or Motrin so we will continue this to avoid confusion and overuse of NSAIDs -Elevate and ice  Final Clinical Impressions(s) / UC Diagnoses   Final diagnoses:  Closed nondisplaced fracture of second metatarsal bone of left foot,  initial encounter     Discharge Instructions     Wear cam boot as we discussed.  Elevate when able and apply ice several times daily for 15 to 20 minutes at a time.  You can also continue taking Motrin as needed for discomfort. Call Dr. Doristine Locks office on Monday to schedule an appointment for next week.     ED Prescriptions    None     PDMP not reviewed this encounter.   Rudolpho Sevin, NP 01/23/20 2159

## 2020-01-23 NOTE — Discharge Instructions (Addendum)
Wear cam boot as we discussed.  Elevate when able and apply ice several times daily for 15 to 20 minutes at a time.  You can also continue taking Motrin as needed for discomfort. Call Dr. Doristine Locks office on Monday to schedule an appointment for next week.

## 2020-01-26 ENCOUNTER — Encounter: Payer: Self-pay | Admitting: Family Medicine

## 2020-01-26 ENCOUNTER — Other Ambulatory Visit: Payer: Self-pay

## 2020-01-26 ENCOUNTER — Ambulatory Visit (INDEPENDENT_AMBULATORY_CARE_PROVIDER_SITE_OTHER): Payer: Medicare Other | Admitting: Family Medicine

## 2020-01-26 VITALS — BP 136/74 | HR 85 | Ht 63.0 in | Wt 153.0 lb

## 2020-01-26 DIAGNOSIS — M84375A Stress fracture, left foot, initial encounter for fracture: Secondary | ICD-10-CM | POA: Diagnosis not present

## 2020-01-26 NOTE — Patient Instructions (Signed)
Good to see you Please try ice  Please try to stay off of the foot.   Please send me a message in MyChart with any questions or updates.  Please see me back in 2-3 weeks.   --Dr. Raeford Razor

## 2020-01-26 NOTE — Progress Notes (Signed)
Carolyn Mckinney - 74 y.o. female MRN [id number]  Date of birth: 10-25-1945  SUBJECTIVE:  Including CC & ROS.  Chief Complaint  Patient presents with  . Foot Injury    left x 1 week    Carolyn Mckinney is a 74 y.o. female that is presenting with acute left foot pain.  She randomly started having foot pain over the past week.  She denies inciting event or trauma.  She has been doing her normal workouts.  She started having significant swelling and pain over the dorsal aspect of the forefoot as well as the plantar aspect.  It seemed to get worse when she was on her trip in [place]..  Independent review of the left foot x-ray from 10/1 shows a possible nondisplaced fracture of the distal second metatarsal.   Review of Systems See HPI   HISTORY: Past Medical, Surgical, Social, and Family History Reviewed & Updated per EMR.   Pertinent Historical Findings include:  Past Medical History:  Diagnosis Date  . Allergy   . Cancer ([place])    Breast  . Closed patellar sleeve fracture of right knee 05/2019  . Hypertension   . Meniere's disease     Past Surgical History:  Procedure Laterality Date  . ABDOMINAL HYSTERECTOMY    . BREAST BIOPSY Left    2001  . BREAST EXCISIONAL BIOPSY Left   . BREAST LUMPECTOMY      Family History  Problem Relation Age of Onset  . Arthritis Mother   . Heart disease Father     Social History   Socioeconomic History  . Marital status: Widowed    Spouse name: Not on file  . Number of children: Not on file  . Years of education: Not on file  . Highest education level: Not on file  Occupational History  . Not on file  Tobacco Use  . Smoking status: Never Smoker  . Smokeless tobacco: Never Used  Vaping Use  . Vaping Use: Never used  Substance and Sexual Activity  . Alcohol use: Yes    Comment: just wine/glass of wine in the evening  . Drug use: Never  . Sexual activity: Not on file  Other Topics Concern  . Not on file  Social History  Narrative   Left Handed   One Story Home   Drinks Caffeine    Social Determinants of Health   Financial Resource Strain: Low Risk   . Difficulty of Paying Living Expenses: Not hard at all  Food Insecurity: No Food Insecurity  . Worried About Charity fundraiser in the Last Year: Never true  . Ran Out of Food in the Last Year: Never true  Transportation Needs: No Transportation Needs  . Lack of Transportation (Medical): No  . Lack of Transportation (Non-Medical): No  Physical Activity:   . Days of Exercise per Week: Not on file  . Minutes of Exercise per Session: Not on file  Stress:   . Feeling of Stress : Not on file  Social Connections:   . Frequency of Communication with Friends and Family: Not on file  . Frequency of Social Gatherings with Friends and Family: Not on file  . Attends Religious Services: Not on file  . Active Member of Clubs or Organizations: Not on file  . Attends Archivist Meetings: Not on file  . Marital Status: Not on file  Intimate Partner Violence:   . Fear of Current or Ex-Partner: Not on file  . Emotionally Abused: Not  on file  . Physically Abused: Not on file  . Sexually Abused: Not on file     PHYSICAL EXAM:  VS: BP 136/74   Pulse 85   Ht 5\' 3"  (1.6 m)   Wt 153 lb (69.4 kg)   BMI 27.10 kg/m  Physical Exam Gen: NAD, alert, cooperative with exam, well-appearing MSK:  Left foot: Obvious swelling of the dorsum of the foot. Tenderness to palpation of the distal metatarsal shaft of the second digit. No ecchymosis. Neurovascularly intact     ASSESSMENT & PLAN:   Stress fracture of left foot No inciting event or trauma and likely the result of a stress fracture.  She has had a few different stressors type injuries over the past year.  Bone density was showing osteopenia.  May need to go ahead and start Prolia or Fosamax. -Postop shoe. -Counseled on supportive care. -Follow-up in 2 weeks.  Can consider ultrasound at that  time.

## 2020-01-26 NOTE — Assessment & Plan Note (Signed)
No inciting event or trauma and likely the result of a stress fracture.  She has had a few different stressors type injuries over the past year.  Bone density was showing osteopenia.  May need to go ahead and start Prolia or Fosamax. -Postop shoe. -Counseled on supportive care. -Follow-up in 2 weeks.  Can consider ultrasound at that time.

## 2020-01-29 ENCOUNTER — Other Ambulatory Visit: Payer: Self-pay | Admitting: Family Medicine

## 2020-01-29 DIAGNOSIS — M792 Neuralgia and neuritis, unspecified: Secondary | ICD-10-CM

## 2020-01-29 NOTE — Telephone Encounter (Signed)
Requesting: Lyrica 150mg   Contract: UDS: Last Visit: 07/08/2019 Next Visit: None scheduled Last Refill: 07/17/2019 #60 and 5RF Pt sig: 1 capsule twice daily  Please Advise

## 2020-01-29 NOTE — Telephone Encounter (Signed)
Pt is due for 6 mo ck. Plz sched. Ty.

## 2020-01-30 DIAGNOSIS — L709 Acne, unspecified: Secondary | ICD-10-CM

## 2020-01-30 MED ORDER — DOXYCYCLINE HYCLATE 100 MG PO TABS: 100.0000 mg | ORAL_TABLET | Freq: Two times a day (BID) | ORAL | 1 refills | Status: DC

## 2020-01-30 NOTE — Telephone Encounter (Signed)
Called and scheduled appt on 02/06/2020.

## 2020-02-06 ENCOUNTER — Encounter: Payer: Self-pay | Admitting: Family Medicine

## 2020-02-06 ENCOUNTER — Other Ambulatory Visit: Payer: Self-pay

## 2020-02-06 ENCOUNTER — Ambulatory Visit (INDEPENDENT_AMBULATORY_CARE_PROVIDER_SITE_OTHER): Payer: Medicare Other | Admitting: Family Medicine

## 2020-02-06 VITALS — BP 104/62 | HR 79 | Temp 97.8°F | Ht 61.0 in | Wt 153.0 lb

## 2020-02-06 DIAGNOSIS — G4700 Insomnia, unspecified: Secondary | ICD-10-CM

## 2020-02-06 DIAGNOSIS — M792 Neuralgia and neuritis, unspecified: Secondary | ICD-10-CM

## 2020-02-06 DIAGNOSIS — I10 Essential (primary) hypertension: Secondary | ICD-10-CM | POA: Diagnosis not present

## 2020-02-06 DIAGNOSIS — Z23 Encounter for immunization: Secondary | ICD-10-CM

## 2020-02-06 NOTE — Progress Notes (Signed)
Chief Complaint  Patient presents with   Follow-up    6 month    Subjective Carolyn Mckinney is a 74 y.o. female who presents for hypertension follow up. She does not routinely monitor home blood pressures. She is compliant with medications- Norvasc 5 mg/d, Maxzide 37.5-25 mg/d, losartan 100 mg/d. Patient has these side effects of medication: none She is adhering to a healthy diet overall. Current exercise: walking  Insomnia Taking around 75 mg qhs and doing well with sleep. No AE's, reports compliance.   Neuropathic pain Taking Lyrica 150 mg bid and Cymbalta 60 mg/d. No AEs, reports compliance. S/s's controlled. No new balance issues unrelated to her acute fx.    Past Medical History:  Diagnosis Date   Allergy    Cancer ([place])    Breast   Closed patellar sleeve fracture of right knee 05/2019   Hypertension    Meniere's disease    Exam BP 104/62 (BP Location: Left Arm, Patient Position: Sitting, Cuff Size: Normal)    Pulse 79    Temp 97.8 F (36.6 C) (Oral)    Ht 5\' 1"  (1.549 m)    Wt 153 lb (69.4 kg)    SpO2 94%    BMI 28.91 kg/m  General:  well developed, well nourished, in no apparent distress Heart: RRR, no bruits, no LE edema Lungs: clear to auscultation, no accessory muscle use Psych: well oriented with normal range of affect and appropriate judgment/insight  Essential hypertension  Insomnia, unspecified type  Neuropathic pain  Need for influenza vaccination - Plan: Flu Vaccine QUAD High Dose(Fluad)  1. OK to stop taking BP at home. Cont Maxzide 376.5-25 mg/d, losartan 100 mg/d, Norvasc 5 mg/d. Counseled on diet and exercise. 2. Cont Trazodone 75 mg qhs prn.  3. Cont Lyrica 150 mg bid, Cymbalta 60 mg/d F/u in 6 mo or prn. The patient voiced understanding and agreement to the plan.  [place], DO 02/06/20  2:44 PM

## 2020-02-06 NOTE — Patient Instructions (Signed)
Keep up the good work.  Keep the diet clean and stay active.  Let us know if you need anything.

## 2020-02-09 ENCOUNTER — Ambulatory Visit (INDEPENDENT_AMBULATORY_CARE_PROVIDER_SITE_OTHER): Payer: Medicare Other | Admitting: Family Medicine

## 2020-02-09 ENCOUNTER — Ambulatory Visit: Payer: Medicare Other | Attending: Internal Medicine

## 2020-02-09 ENCOUNTER — Encounter: Payer: Self-pay | Admitting: Family Medicine

## 2020-02-09 ENCOUNTER — Other Ambulatory Visit (HOSPITAL_BASED_OUTPATIENT_CLINIC_OR_DEPARTMENT_OTHER): Payer: Self-pay | Admitting: Internal Medicine

## 2020-02-09 ENCOUNTER — Other Ambulatory Visit: Payer: Self-pay

## 2020-02-09 DIAGNOSIS — Z23 Encounter for immunization: Secondary | ICD-10-CM

## 2020-02-09 DIAGNOSIS — M84375D Stress fracture, left foot, subsequent encounter for fracture with routine healing: Secondary | ICD-10-CM

## 2020-02-09 NOTE — Progress Notes (Signed)
Carolyn Mckinney - 74 y.o. female MRN [id number]  Date of birth: Nov 22, 1945  SUBJECTIVE:  Including CC & ROS.  Chief Complaint  Patient presents with  . Follow-up    left foot    Carolyn Mckinney is a 74 y.o. female that is following up for her left foot pain.  She reports doing much better.  She does avoid going barefoot.   Review of Systems See HPI   HISTORY: Past Medical, Surgical, Social, and Family History Reviewed & Updated per EMR.   Pertinent Historical Findings include:  Past Medical History:  Diagnosis Date  . Allergy   . Cancer ([place])    Breast  . Closed patellar sleeve fracture of right knee 05/2019  . Hypertension   . Meniere's disease     Past Surgical History:  Procedure Laterality Date  . ABDOMINAL HYSTERECTOMY    . BREAST BIOPSY Left    2001  . BREAST EXCISIONAL BIOPSY Left   . BREAST LUMPECTOMY      Family History  Problem Relation Age of Onset  . Arthritis Mother   . Heart disease Father     Social History   Socioeconomic History  . Marital status: Widowed    Spouse name: Not on file  . Number of children: Not on file  . Years of education: Not on file  . Highest education level: Not on file  Occupational History  . Not on file  Tobacco Use  . Smoking status: Never Smoker  . Smokeless tobacco: Never Used  Vaping Use  . Vaping Use: Never used  Substance and Sexual Activity  . Alcohol use: Yes    Comment: just wine/glass of wine in the evening  . Drug use: Never  . Sexual activity: Not on file  Other Topics Concern  . Not on file  Social History Narrative   Left Handed   One Story Home   Drinks Caffeine    Social Determinants of Health   Financial Resource Strain: Low Risk   . Difficulty of Paying Living Expenses: Not hard at all  Food Insecurity: No Food Insecurity  . Worried About Charity fundraiser in the Last Year: Never true  . Ran Out of Food in the Last Year: Never true  Transportation Needs: No Transportation Needs  .  Lack of Transportation (Medical): No  . Lack of Transportation (Non-Medical): No  Physical Activity:   . Days of Exercise per Week: Not on file  . Minutes of Exercise per Session: Not on file  Stress:   . Feeling of Stress : Not on file  Social Connections:   . Frequency of Communication with Friends and Family: Not on file  . Frequency of Social Gatherings with Friends and Family: Not on file  . Attends Religious Services: Not on file  . Active Member of Clubs or Organizations: Not on file  . Attends Archivist Meetings: Not on file  . Marital Status: Not on file  Intimate Partner Violence:   . Fear of Current or Ex-Partner: Not on file  . Emotionally Abused: Not on file  . Physically Abused: Not on file  . Sexually Abused: Not on file     PHYSICAL EXAM:  VS: BP 131/81   Pulse 80   Ht 5\' 1"  (1.549 m)   Wt 151 lb (68.5 kg)   BMI 28.53 kg/m  Physical Exam Gen: NAD, alert, cooperative with exam, well-appearing MSK:  Left foot: Mild swelling of the dorsum of the foot.  No ecchymosis. Tenderness to palpation at the base of the second third metatarsal. Neurovascularly intact     ASSESSMENT & PLAN:   Stress fracture of left foot Has had improvement with adjustments thus far. -Counseled on supportive care. -Counseled on avoiding going barefoot. -Could consider Prolia.

## 2020-02-09 NOTE — Assessment & Plan Note (Signed)
Has had improvement with adjustments thus far. -Counseled on supportive care. -Counseled on avoiding going barefoot. -Could consider Prolia.

## 2020-02-09 NOTE — Progress Notes (Signed)
   Covid-19 Vaccination Clinic  Name:  Abbagale Goguen    MRN: [id number] DOB: 1946-03-26  02/09/2020  Ms. Looman was observed post Covid-19 immunization for 15 minutes without incident. She was provided with Vaccine Information Sheet and instruction to access the V-Safe system.   Ms. Folson was instructed to call 911 with any severe reactions post vaccine: Marland Kitchen Difficulty breathing  . Swelling of face and throat  . A fast heartbeat  . A bad rash all over body  . Dizziness and weakness

## 2020-02-17 MED FILL — PFIZER-BIONTECH COVID-19 VA: 30 | 1 days supply | Qty: 0 | Fill #0

## 2020-02-23 ENCOUNTER — Other Ambulatory Visit: Payer: Self-pay | Admitting: Family Medicine

## 2020-02-23 DIAGNOSIS — M792 Neuralgia and neuritis, unspecified: Secondary | ICD-10-CM

## 2020-02-23 NOTE — Telephone Encounter (Signed)
Requesting: Lyrica 150mg  Contract: None UDS: None Last Visit: 02/06/2020 Next Visit: None scheduled Last Refill: 01/29/2020 #60 and 0RF   Please Advise

## 2020-02-26 ENCOUNTER — Ambulatory Visit: Payer: Medicare Other

## 2020-02-26 ENCOUNTER — Other Ambulatory Visit: Payer: Self-pay

## 2020-02-26 VITALS — Ht 61.0 in | Wt 153.0 lb

## 2020-02-26 DIAGNOSIS — Z1211 Encounter for screening for malignant neoplasm of colon: Secondary | ICD-10-CM

## 2020-02-26 MED ORDER — SUTAB 1479-225-188 MG PO TABS: 12.0000 | ORAL_TABLET | ORAL | 0 refills | Status: DC

## 2020-02-26 NOTE — Progress Notes (Signed)
No allergies to soy or egg Pt is not on blood thinners or diet pills Denies issues with sedation/intubation Denies atrial flutter/fib Denies constipation   Emmi instructions given to pt  Pt is aware of Covid safety and care partner requirements.  

## 2020-03-05 ENCOUNTER — Other Ambulatory Visit: Payer: Self-pay | Admitting: Family Medicine

## 2020-03-05 DIAGNOSIS — G4700 Insomnia, unspecified: Secondary | ICD-10-CM

## 2020-03-09 ENCOUNTER — Encounter: Payer: Self-pay | Admitting: Gastroenterology

## 2020-03-09 ENCOUNTER — Other Ambulatory Visit: Payer: Self-pay

## 2020-03-09 ENCOUNTER — Ambulatory Visit (AMBULATORY_SURGERY_CENTER): Payer: Medicare Other | Admitting: Gastroenterology

## 2020-03-09 VITALS — BP 142/74 | HR 76 | Temp 97.8°F | Resp 18 | Ht 61.0 in | Wt 153.0 lb

## 2020-03-09 DIAGNOSIS — Z1211 Encounter for screening for malignant neoplasm of colon: Secondary | ICD-10-CM | POA: Diagnosis not present

## 2020-03-09 MED ORDER — SODIUM CHLORIDE 0.9 % IV SOLN: 500.0000 mL | Freq: Once | INTRAVENOUS | Status: DC

## 2020-03-09 NOTE — Patient Instructions (Signed)
Read all of the handouts given to you by your recovery room nurse.  Thank-you for choosing [place] for your healthcare needs today.  YOU HAD AN ENDOSCOPIC PROCEDURE TODAY AT [facility]:   Refer to the procedure report that was given to you for any specific questions about what was found during the examination.  If the procedure report does not answer your questions, please call your gastroenterologist to clarify.  If you requested that your care partner not be given the details of your procedure findings, then the procedure report has been included in a sealed envelope for you to review at your convenience later.  YOU SHOULD EXPECT: Some feelings of bloating in the abdomen. Passage of more gas than usual.  Walking can help get rid of the air that was put into your GI tract during the procedure and reduce the bloating. If you had a lower endoscopy (such as a colonoscopy or flexible sigmoidoscopy) you may notice spotting of blood in your stool or on the toilet paper. If you underwent a bowel prep for your procedure, you may not have a normal bowel movement for a few days.  Please Note:  You might notice some irritation and congestion in your nose or some drainage.  This is from the oxygen used during your procedure.  There is no need for concern and it should clear up in a day or so.  SYMPTOMS TO REPORT IMMEDIATELY:   Following lower endoscopy (colonoscopy or flexible sigmoidoscopy):  Excessive amounts of blood in the stool  Significant tenderness or worsening of abdominal pains  Swelling of the abdomen that is new, acute  Fever of 100F or higher   For urgent or emergent issues, a gastroenterologist can be reached at any hour by calling [phone]. Do not use MyChart messaging for urgent concerns.    DIET:  We do recommend a small meal at first, but then you may proceed to your regular diet.  Drink plenty of fluids but you should avoid alcoholic beverages for 24 hours. Try to  increase the fiber in your diet, and drink plenty of water.  ACTIVITY:  You should plan to take it easy for the rest of today and you should NOT DRIVE or use heavy machinery until tomorrow (because of the sedation medicines used during the test).    FOLLOW UP: Our staff will call the number listed on your records 48-72 hours following your procedure to check on you and address any questions or concerns that you may have regarding the information given to you following your procedure. If we do not reach you, we will leave a message.  We will attempt to reach you two times.  During this call, we will ask if you have developed any symptoms of COVID 19. If you develop any symptoms (ie: fever, flu-like symptoms, shortness of breath, cough etc.) before then, please call [phone].  If you test positive for Covid 19 in the 2 weeks post procedure, please call and report this information to [place].     SIGNATURES/CONFIDENTIALITY: You and/or your care partner have signed paperwork which will be entered into your electronic medical record.  These signatures attest to the fact that that the information above on your After Visit Summary has been reviewed and is understood.  Full responsibility of the confidentiality of this discharge information lies with you and/or your care-partner.

## 2020-03-09 NOTE — Progress Notes (Signed)
pt tolerated well. VSS. awake and to recovery. Report given to RN.  

## 2020-03-09 NOTE — Op Note (Signed)
[facility] Patient Name: Carolyn Mckinney Procedure Date: 03/09/2020 1:33 PM MRN: [id number] Endoscopist: Jackquline Denmark , MD Age: 74 Referring MD:  Date of Birth: 07-Jul-1945 Gender: Female Account #: [id number] Procedure:                Colonoscopy Indications:              Screening for colorectal malignant neoplasm Medicines:                Monitored Anesthesia Care Procedure:                Pre-Anesthesia Assessment:                           - Prior to the procedure, a History and Physical                            was performed, and patient medications and                            allergies were reviewed. The patient's tolerance of                            previous anesthesia was also reviewed. The risks                            and benefits of the procedure and the sedation                            options and risks were discussed with the patient.                            All questions were answered, and informed consent                            was obtained. Prior Anticoagulants: The patient has                            taken no previous anticoagulant or antiplatelet                            agents. ASA Grade Assessment: II - A patient with                            mild systemic disease. After reviewing the risks                            and benefits, the patient was deemed in                            satisfactory condition to undergo the procedure.                           After obtaining informed consent, the colonoscope  was passed under direct vision. Throughout the                            procedure, the patient's blood pressure, pulse, and                            oxygen saturations were monitored continuously. The                            Colonoscope was introduced through the anus and                            advanced to the 2 cm into the ileum. The                            colonoscopy was  technically difficult and complex                            due to a tortuous colon. Successful completion of                            the procedure was aided by applying abdominal                            pressure. The patient tolerated the procedure well.                            The quality of the bowel preparation was good. The                            terminal ileum, ileocecal valve, appendiceal                            orifice, and rectum were photographed. Scope In: 1:43:13 PM Scope Out: 2:09:29 PM Scope Withdrawal Time: 0 hours 8 minutes 39 seconds  Total Procedure Duration: 0 hours 26 minutes 16 seconds  Findings:                 A few small-mouthed diverticula were found in the                            sigmoid colon. The colon was highly redundant.                           Non-bleeding internal hemorrhoids were found during                            retroflexion and during perianal exam. The                            hemorrhoids were small.                           The terminal ileum appeared normal.  The exam was otherwise without abnormality on                            direct and retroflexion views. Complications:            No immediate complications. Estimated Blood Loss:     Estimated blood loss: none. Impression:               - Mild sigmoid diverticulosis.                           - Non-bleeding internal hemorrhoids.                           - The examined portion of the ileum was normal.                           - The examination was otherwise normal on direct                            and retroflexion views.                           - No specimens collected. Recommendation:           - Patient has a contact number available for                            emergencies. The signs and symptoms of potential                            delayed complications were discussed with the                            patient. Return to  normal activities tomorrow.                            Written discharge instructions were provided to the                            patient.                           - High fiber diet.                           - Continue present medications.                           - Repeat colonoscopy is not recommended for                            screening purposes. Hence repeat colonoscopy only                            if with any new problems.                           -  Return to GI office PRN. Jackquline Denmark, MD 03/09/2020 2:14:16 PM This report has been signed electronically.

## 2020-03-09 NOTE — Progress Notes (Signed)
Pt's states no medical or surgical changes since previsit or office visit.  Cw vitals and JD IV.

## 2020-03-11 ENCOUNTER — Telehealth: Payer: Self-pay

## 2020-03-11 NOTE — Telephone Encounter (Signed)
°  Follow up Call-  Call back number 03/09/2020  Post procedure Call Back phone  # [phone]  Permission to leave phone message Yes     Patient questions:  Do you have a fever, pain , or abdominal swelling? No. Pain Score  0 *  Have you tolerated food without any problems? Yes.    Have you been able to return to your normal activities? Yes.    Do you have any questions about your discharge instructions: Diet   No. Medications  No. Follow up visit  No.  Do you have questions or concerns about your Care? No.  Actions: * If pain score is 4 or above: No action needed, pain <4.  1. Have you developed a fever since your procedure? no  2.   Have you had an respiratory symptoms (SOB or cough) since your procedure? no  3.   Have you tested positive for COVID 19 since your procedure no  4.   Have you had any family members/close contacts diagnosed with the COVID 19 since your procedure?  no   If yes to any of these questions please route to Joylene John, RN and Joella Prince, RN

## 2020-03-19 ENCOUNTER — Other Ambulatory Visit: Payer: Self-pay | Admitting: Family Medicine

## 2020-04-22 ENCOUNTER — Other Ambulatory Visit: Payer: Self-pay | Admitting: *Deleted

## 2020-04-22 MED ORDER — LOSARTAN POTASSIUM 100 MG PO TABS
100.0000 mg | ORAL_TABLET | Freq: Every day | ORAL | 1 refills | Status: DC
Start: 2020-04-22 — End: 2020-05-07

## 2020-04-22 NOTE — Telephone Encounter (Signed)
Opened in error

## 2020-04-27 ENCOUNTER — Other Ambulatory Visit: Payer: Self-pay | Admitting: Family Medicine

## 2020-04-27 DIAGNOSIS — L709 Acne, unspecified: Secondary | ICD-10-CM

## 2020-04-27 NOTE — Progress Notes (Signed)
.  derm

## 2020-05-03 ENCOUNTER — Other Ambulatory Visit: Payer: Self-pay | Admitting: Family Medicine

## 2020-05-03 MED ORDER — ERYTHROMYCIN BASE 500 MG PO TABS: 500.0000 mg | ORAL_TABLET | Freq: Two times a day (BID) | ORAL | 2 refills | Status: DC

## 2020-05-03 NOTE — Telephone Encounter (Signed)
I've never heard of amoxicillin being used for acne, which is in part why we are referring. We can try Erythromycin or substitute her blood pressure medicine Maxzide to spironolactone, which does better controlling acne.

## 2020-05-07 MED ORDER — LOSARTAN POTASSIUM 100 MG PO TABS: 100.0000 mg | ORAL_TABLET | Freq: Every day | ORAL | 1 refills | Status: DC

## 2020-05-07 MED ORDER — AMLODIPINE BESYLATE 5 MG PO TABS: 5.0000 mg | ORAL_TABLET | Freq: Every day | ORAL | 1 refills | Status: DC

## 2020-05-12 ENCOUNTER — Other Ambulatory Visit: Payer: Self-pay

## 2020-05-12 ENCOUNTER — Ambulatory Visit: Payer: Self-pay

## 2020-05-12 ENCOUNTER — Ambulatory Visit (INDEPENDENT_AMBULATORY_CARE_PROVIDER_SITE_OTHER): Payer: Medicare Other | Admitting: Family Medicine

## 2020-05-12 VITALS — BP 126/70 | Ht 61.0 in | Wt 147.0 lb

## 2020-05-12 DIAGNOSIS — S76312A Strain of muscle, fascia and tendon of the posterior muscle group at thigh level, left thigh, initial encounter: Secondary | ICD-10-CM | POA: Diagnosis not present

## 2020-05-12 DIAGNOSIS — M1712 Unilateral primary osteoarthritis, left knee: Secondary | ICD-10-CM | POA: Diagnosis not present

## 2020-05-12 DIAGNOSIS — M25562 Pain in left knee: Secondary | ICD-10-CM

## 2020-05-12 NOTE — Progress Notes (Signed)
Carolyn Mckinney - 75 y.o. female MRN [id number]  Date of birth: [birth year]  SUBJECTIVE:  Including CC & ROS.  No chief complaint on file.   Carolyn Mckinney is a 75 y.o. female that is presenting with left knee and hamstring pain.  She felt the pain when she was kneeling down on her left hand.  Had been receiving injections in her knee while living in [place].  She has not done so since moving back.  That was roughly 2 years ago.  Has slowly gotten improvement over the past few days.   Review of Systems See HPI   HISTORY: Past Medical, Surgical, Social, and Family History Reviewed & Updated per EMR.   Pertinent Historical Findings include:  Past Medical History:  Diagnosis Date  . Allergy   . Cancer ([place])    Breast  . Cataract    cataract repair bilaterally  . Closed patellar sleeve fracture of right knee 05/2019  . Hypertension   . Meniere's disease     Past Surgical History:  Procedure Laterality Date  . ABDOMINAL HYSTERECTOMY    . BREAST BIOPSY Left    2001  . BREAST EXCISIONAL BIOPSY Left   . BREAST LUMPECTOMY    . COLONOSCOPY  2006    Family History  Problem Relation Age of Onset  . Arthritis Mother   . Heart disease Father   . Colon cancer Neg Hx   . Colon polyps Neg Hx   . Esophageal cancer Neg Hx   . Rectal cancer Neg Hx   . Stomach cancer Neg Hx     Social History   Socioeconomic History  . Marital status: Widowed    Spouse name: Not on file  . Number of children: Not on file  . Years of education: Not on file  . Highest education level: Not on file  Occupational History  . Not on file  Tobacco Use  . Smoking status: Never Smoker  . Smokeless tobacco: Never Used  Vaping Use  . Vaping Use: Never used  Substance and Sexual Activity  . Alcohol use: Yes    Comment: just wine/glass of wine in the evening  . Drug use: Never  . Sexual activity: Not on file  Other Topics Concern  . Not on file  Social History Narrative   Left Handed   One Story  Home   Drinks Caffeine    Social Determinants of Health   Financial Resource Strain: Low Risk   . Difficulty of Paying Living Expenses: Not hard at all  Food Insecurity: No Food Insecurity  . Worried About Charity fundraiser in the Last Year: Never true  . Ran Out of Food in the Last Year: Never true  Transportation Needs: No Transportation Needs  . Lack of Transportation (Medical): No  . Lack of Transportation (Non-Medical): No  Physical Activity: Not on file  Stress: Not on file  Social Connections: Not on file  Intimate Partner Violence: Not on file     PHYSICAL EXAM:  VS: BP 126/70   Ht 5\' 1"  (1.549 m)   Wt 147 lb (66.7 kg)   BMI 27.78 kg/m  Physical Exam Gen: NAD, alert, cooperative with exam, well-appearing MSK:  Left knee: No effusion. No swelling or ecchymosis. Normal strength resistance. Neurovascular intact  Limited ultrasound: Left knee, left hamstring:  No effusion in the suprapatellar pouch. Normal-appearing quadricep and patellar tendon. Degenerative changes appreciated of the medial meniscus and joint space. Increased hyperemia of the medial retinaculum  with no appreciated tear. Small Baker's cyst. Increased hyperemia over the distal biceps femoris   Summary: Findings would suggest irritation of the medial retinaculum and distal hamstring strain.  Ultrasound and interpretation by Clearance Coots, MD    ASSESSMENT & PLAN:   Strain of left hamstring Has what appears irritation of the distal biceps femoris.  No appreciated tear.  Does have pain that radiates proximally and worse at the end of the day. -Counseled on home exercise therapy and supportive care. -Could consider physical therapy.  Primary osteoarthritis of left knee Does have degenerative changes but no significant effusion on exam.  Does have irritation of the medial retinaculum. -Counseled on home exercise therapy and supportive care. -Provided Pennsaid samples. -Could consider  physical therapy or prednisone or bracing.

## 2020-05-12 NOTE — Assessment & Plan Note (Signed)
Has what appears irritation of the distal biceps femoris.  No appreciated tear.  Does have pain that radiates proximally and worse at the end of the day. -Counseled on home exercise therapy and supportive care. -Could consider physical therapy.

## 2020-05-12 NOTE — Patient Instructions (Signed)
Good to see you Please try heat on the hamstring  Please try a few exercises that seem to benefit you   Please send me a message in MyChart with any questions or updates.  Please see me back in 4 weeks or as needed if better.   --Dr. Raeford Razor

## 2020-05-12 NOTE — Assessment & Plan Note (Signed)
Does have degenerative changes but no significant effusion on exam.  Does have irritation of the medial retinaculum. -Counseled on home exercise therapy and supportive care. -Provided Pennsaid samples. -Could consider physical therapy or prednisone or bracing.

## 2020-05-12 NOTE — Progress Notes (Signed)
Medication Samples have been provided to the patient.  Drug name: pennsaid       Strength: 2%        Qty: 2 boxes [id number]  Exp.Date: 08/22/2020  Dosing instructions: use a pea size amount and rub gently  The patient has been instructed regarding the correct time, dose, and frequency of taking this medication, including desired effects and most common side effects.   Carolyn Mckinney 11:22 AM 05/12/2020

## 2020-05-13 ENCOUNTER — Other Ambulatory Visit: Payer: Self-pay | Admitting: Neurology

## 2020-05-13 DIAGNOSIS — R202 Paresthesia of skin: Secondary | ICD-10-CM

## 2020-05-14 ENCOUNTER — Telehealth: Payer: Self-pay

## 2020-05-14 NOTE — Telephone Encounter (Signed)
PA initiated via Covermymeds; KEY:  [id number]. Awaiting determination.

## 2020-05-17 NOTE — Telephone Encounter (Signed)
PA denied.  You asked for the drug listed above for your acne. [employer] follows Medicare rules. The Medicare rule in the Prescription Drug Manual (Chapter 6, Section 10.6) says an off-label use of a drug is a use that is not included on the drugs label as approved by the U.S. Food and Drug Administration (FDA). FDA approved drugs used for a disease other than what is on the official label may be covered under Medicare if [employer] determines the use to be medically accepted. [employer] makes these decisions on a case-by-case basis. We take into consideration the two major drug compendia. These compendia (or drug guides) are called the [place] and the [place] (AHFS-DI). Off-label use is medically accepted when there is evidence in one or more of the compendia that it works for the disease in question. [employer] has determined that the off-label use of this drug for your condition is not medically accepted per Medicare rules and isn&apos;t covered based on available information.

## 2020-05-19 NOTE — Telephone Encounter (Signed)
No need to appeal per Dr. Nani Ravens.

## 2020-05-26 ENCOUNTER — Other Ambulatory Visit: Payer: Self-pay | Admitting: Neurology

## 2020-05-26 DIAGNOSIS — R202 Paresthesia of skin: Secondary | ICD-10-CM

## 2020-05-31 ENCOUNTER — Other Ambulatory Visit: Payer: Self-pay | Admitting: Neurology

## 2020-05-31 DIAGNOSIS — R202 Paresthesia of skin: Secondary | ICD-10-CM

## 2020-06-01 ENCOUNTER — Other Ambulatory Visit: Payer: Self-pay | Admitting: Neurology

## 2020-06-01 DIAGNOSIS — R202 Paresthesia of skin: Secondary | ICD-10-CM

## 2020-06-02 ENCOUNTER — Other Ambulatory Visit: Payer: Self-pay | Admitting: *Deleted

## 2020-06-02 ENCOUNTER — Other Ambulatory Visit: Payer: Self-pay

## 2020-06-02 ENCOUNTER — Ambulatory Visit (INDEPENDENT_AMBULATORY_CARE_PROVIDER_SITE_OTHER): Payer: Medicare Other | Admitting: Obstetrics & Gynecology

## 2020-06-02 ENCOUNTER — Encounter: Payer: Self-pay | Admitting: Obstetrics & Gynecology

## 2020-06-02 VITALS — BP 138/75 | HR 75 | Ht 61.0 in | Wt 145.0 lb

## 2020-06-02 DIAGNOSIS — Z01419 Encounter for gynecological examination (general) (routine) without abnormal findings: Secondary | ICD-10-CM | POA: Diagnosis not present

## 2020-06-02 MED ORDER — PENNSAID 2 % EX SOLN: CUTANEOUS | 2 refills | Status: DC

## 2020-06-02 NOTE — Progress Notes (Signed)
GYNECOLOGY ANNUAL PREVENTATIVE CARE ENCOUNTER NOTE  History:     Carolyn Mckinney is a 75 y.o. [phone] PMP  female here for a routine annual gynecologic exam and to establish care.  History of vaginal hysterectomy in 1991 for benign indications, benign pathology.  Current complaints: none. Recently widowed since 2020, not sexually active.   Denies abnormal vaginal bleeding, discharge, pelvic pain, or other gynecologic concerns.    Gynecologic History No LMP recorded. Patient has had a hysterectomy. Last mammogram: 10/21/2019. Results were: normal  Obstetric History OB History  Gravida Para Term Preterm AB Living  3 3 3     3   SAB IAB Ectopic Multiple Live Births          3    # Outcome Date GA Lbr Len/2nd Weight Sex Delivery Anes PTL Lv  3 Term 1980 [redacted]w[redacted]d   M Vag-Spont None N LIV  2 Term 1977 [redacted]w[redacted]d   F Vag-Spont None N LIV  1 Term 1974 [redacted]w[redacted]d   F Vag-Spont None N LIV    Past Medical History:  Diagnosis Date  . Allergy   . Cancer ([place])    Breast  . Cataract    cataract repair bilaterally  . Closed patellar sleeve fracture of right knee 05/2019  . Hypertension   . Meniere's disease     Past Surgical History:  Procedure Laterality Date  . BREAST BIOPSY Left    2001  . BREAST EXCISIONAL BIOPSY Left   . BREAST LUMPECTOMY    . COLONOSCOPY  2006  . VAGINAL HYSTERECTOMY  1991   Done for bleeding. Benign pathology    Current Outpatient Medications on File Prior to Visit  Medication Sig Dispense Refill  . amLODipine (NORVASC) 5 MG tablet Take 1 tablet (5 mg total) by mouth daily. 90 tablet 1  . amoxicillin (AMOXIL) 500 MG capsule     . ASPIRIN PO Take by mouth.    . Calcium Carb-Cholecalciferol (CALCIUM + D3 PO) Take by mouth.    . DULoxetine (CYMBALTA) 60 MG capsule Take 1 capsule by mouth once daily 30 capsule 0  . Ferrous Sulfate (IRON PO) Take by mouth.    . losartan (COZAAR) 100 MG tablet Take 1 tablet (100 mg total) by mouth daily. 90 tablet 1  . Multiple Vitamin  (MULTIVITAMIN ADULT PO) Take by mouth.    . pregabalin (LYRICA) 150 MG capsule Take 1 capsule by mouth twice daily 60 capsule 5  . traZODone (DESYREL) 50 MG tablet Take 1-2 tablets (50-100 mg total) by mouth at bedtime as needed for sleep. 60 tablet 3  . triamterene-hydrochlorothiazide (MAXZIDE-25) 37.5-25 MG tablet Take 1 tablet by mouth daily. 90 tablet 1  . valACYclovir (VALTREX) 500 MG tablet TAKE 4 TABLETS BY MOUTH AT ONSET OF A BREAKOUT AND REPEAT IN 12 HOURS. TAKE 1 TABLET BY MOUTH DAILY. 90 tablet 0  . VITAMIN K PO Take by mouth.    . erythromycin base (E-MYCIN) 500 MG tablet Take 1 tablet (500 mg total) by mouth 2 (two) times daily. (Patient not taking: Reported on 06/02/2020) 60 tablet 2   No current facility-administered medications on file prior to visit.    No Known Allergies  Social History:  reports that she has never smoked. She has never used smokeless tobacco. She reports current alcohol use. She reports that she does not use drugs.  Family History  Problem Relation Age of Onset  . Arthritis Mother   . Heart disease Father   .  Colon cancer Neg Hx   . Colon polyps Neg Hx   . Esophageal cancer Neg Hx   . Rectal cancer Neg Hx   . Stomach cancer Neg Hx     The following portions of the patient's history were reviewed and updated as appropriate: allergies, current medications, past family history, past medical history, past social history, past surgical history and problem list.  Review of Systems Pertinent items noted in HPI and remainder of comprehensive ROS otherwise negative.  Physical Exam:  BP 138/75   Pulse 75   Ht 5\' 1"  (1.549 m)   Wt 145 lb (65.8 kg)   BMI 27.40 kg/m  CONSTITUTIONAL: Well-developed, well-nourished female in no acute distress.  HENT:  Normocephalic, atraumatic, External right and left ear normal.  EYES: Conjunctivae and EOM are normal. Pupils are equal, round, and reactive to light. No scleral icterus.  NECK: Normal range of motion, supple,  no masses.  Normal thyroid.  SKIN: Skin is warm and dry. No rash noted. Not diaphoretic. No erythema. No pallor. MUSCULOSKELETAL: Normal range of motion. No tenderness.  No cyanosis, clubbing, or edema. NEUROLOGIC: Alert and oriented to person, place, and time. Normal reflexes, muscle tone coordination.  PSYCHIATRIC: Normal mood and affect. Normal behavior. Normal judgment and thought content. CARDIOVASCULAR: Normal heart rate noted, regular rhythm RESPIRATORY: Clear to auscultation bilaterally. Effort and breath sounds normal, no problems with respiration noted. BREASTS: Symmetric in size. No masses, tenderness, skin changes, nipple drainage, or lymphadenopathy bilaterally. Performed in the presence of a chaperone. ABDOMEN: Soft, no distention noted.  No tenderness, rebound or guarding.  PELVIC: Normal appearing external genitalia and urethral meatus with moderate atrophy; normal appearing vaginal mucosa and cuff with moderate atrophy.  No abnormal discharge noted. No adnexal tenderness.  Performed in the presence of a chaperone.   Assessment and Plan:    1. Well woman exam with routine gynecological exam Normal pelvic exam. Mammogram to be scheduled later in the year. Routine preventative health maintenance measures emphasized. Please refer to After Visit Summary for other counseling recommendations.      Verita Schneiders, MD, [place] for [employer], [place]

## 2020-06-02 NOTE — Patient Instructions (Signed)
Preventive Care 76 Years and Older, Female Preventive care refers to lifestyle choices and visits with your health care provider that can promote health and wellness. This includes:  A yearly physical exam. This is also called an annual wellness visit.  Regular dental and eye exams.  Immunizations.  Screening for certain conditions.  Healthy lifestyle choices, such as: ? Eating a healthy diet. ? Getting regular exercise. ? Not using drugs or products that contain nicotine and tobacco. ? Limiting alcohol use. What can I expect for my preventive care visit? Physical exam Your health care provider will check your:  Height and weight. These may be used to calculate your BMI (body mass index). BMI is a measurement that tells if you are at a healthy weight.  Heart rate and blood pressure.  Body temperature.  Skin for abnormal spots. Counseling Your health care provider may ask you questions about your:  Past medical problems.  Family's medical history.  Alcohol, tobacco, and drug use.  Emotional well-being.  Home life and relationship well-being.  Sexual activity.  Diet, exercise, and sleep habits.  History of falls.  Memory and ability to understand (cognition).  Work and work Statistician.  Pregnancy and menstrual history.  Access to firearms. What immunizations do I need? Vaccines are usually given at various ages, according to a schedule. Your health care provider will recommend vaccines for you based on your age, medical history, and lifestyle or other factors, such as travel or where you work.   What tests do I need? Blood tests  Lipid and cholesterol levels. These may be checked every 5 years, or more often depending on your overall health.  Hepatitis C test.  Hepatitis B test. Screening  Lung cancer screening. You may have this screening every year starting at age 76 if you have a 30-pack-year history of smoking and currently smoke or have quit within  the past 15 years.  Colorectal cancer screening. ? All adults should have this screening starting at age 76 and continuing until age 76. ? Your health care provider may recommend screening at age 76 if you are at increased risk. ? You will have tests every 1-10 years, depending on your results and the type of screening test.  Diabetes screening. ? This is done by checking your blood sugar (glucose) after you have not eaten for a while (fasting). ? You may have this done every 1-3 years.  Mammogram. ? This may be done every 1-2 years. ? Talk with your health care provider about how often you should have regular mammograms.  Abdominal aortic aneurysm (AAA) screening. You may need this if you are a current or former smoker.  BRCA-related cancer screening. This may be done if you have a family history of breast, ovarian, tubal, or peritoneal cancers. Other tests  STD (sexually transmitted disease) testing, if you are at risk.  Bone density scan. This is done to screen for osteoporosis. You may have this done starting at age 76. Talk with your health care provider about your test results, treatment options, and if necessary, the need for more tests. Follow these instructions at home: Eating and drinking  Eat a diet that includes fresh fruits and vegetables, whole grains, lean protein, and low-fat dairy products. Limit your intake of foods with high amounts of sugar, saturated fats, and salt.  Take vitamin and mineral supplements as recommended by your health care provider.  Do not drink alcohol if your health care provider tells you not to drink.  If you drink alcohol: ? Limit how much you have to 0-1 drink a day. ? Be aware of how much alcohol is in your drink. In the [place]., one drink equals one 12 oz bottle of beer (355 mL), one 5 oz glass of wine (148 mL), or one 1 oz glass of hard liquor (44 mL).   Lifestyle  Take daily care of your teeth and gums. Brush your teeth every morning  and night with fluoride toothpaste. Floss one time each day.  Stay active. Exercise for at least 30 minutes 5 or more days each week.  Do not use any products that contain nicotine or tobacco, such as cigarettes, e-cigarettes, and chewing tobacco. If you need help quitting, ask your health care provider.  Do not use drugs.  If you are sexually active, practice safe sex. Use a condom or other form of protection in order to prevent STIs (sexually transmitted infections).  Talk with your health care provider about taking a low-dose aspirin or statin.  Find healthy ways to cope with stress, such as: ? Meditation, yoga, or listening to music. ? Journaling. ? Talking to a trusted person. ? Spending time with friends and family. Safety  Always wear your seat belt while driving or riding in a vehicle.  Do not drive: ? If you have been drinking alcohol. Do not ride with someone who has been drinking. ? When you are tired or distracted. ? While texting.  Wear a helmet and other protective equipment during sports activities.  If you have firearms in your house, make sure you follow all gun safety procedures. What's next?  Visit your health care provider once a year for an annual wellness visit.  Ask your health care provider how often you should have your eyes and teeth checked.  Stay up to date on all vaccines. This information is not intended to replace advice given to you by your health care provider. Make sure you discuss any questions you have with your health care provider. Document Revised: 03/31/2020 Document Reviewed: 04/04/2018 Elsevier Patient Education  2021 Elsevier Inc.  

## 2020-06-07 ENCOUNTER — Telehealth: Payer: Self-pay | Admitting: Family Medicine

## 2020-06-07 NOTE — Telephone Encounter (Signed)
Patient stopped by office Wedneday 2/9 to ask for more samples of Pennsaid (says it worked very well ) & she has an upcoming appt this week wonders if provider will give her some samples & or call in a Rx to her pharmacy :   [facility]  [place], [place] [address]  Phone:  [phone] Fax:  [phone]   --glh

## 2020-06-10 ENCOUNTER — Ambulatory Visit (HOSPITAL_BASED_OUTPATIENT_CLINIC_OR_DEPARTMENT_OTHER)
Admission: RE | Admit: 2020-06-10 | Discharge: 2020-06-10 | Disposition: A | Discharge status: Home / Self Care | Payer: Medicare Other | Source: Ambulatory Visit | Attending: Family Medicine | Admitting: Family Medicine

## 2020-06-10 ENCOUNTER — Other Ambulatory Visit: Payer: Self-pay

## 2020-06-10 ENCOUNTER — Ambulatory Visit (INDEPENDENT_AMBULATORY_CARE_PROVIDER_SITE_OTHER): Payer: Medicare Other | Admitting: Family Medicine

## 2020-06-10 VITALS — BP 120/80 | Ht 61.0 in | Wt 145.0 lb

## 2020-06-10 DIAGNOSIS — M1712 Unilateral primary osteoarthritis, left knee: Secondary | ICD-10-CM | POA: Insufficient documentation

## 2020-06-10 DIAGNOSIS — M5416 Radiculopathy, lumbar region: Secondary | ICD-10-CM

## 2020-06-10 DIAGNOSIS — M25562 Pain in left knee: Secondary | ICD-10-CM | POA: Diagnosis not present

## 2020-06-10 DIAGNOSIS — M545 Low back pain, unspecified: Secondary | ICD-10-CM | POA: Diagnosis not present

## 2020-06-10 IMAGING — DX DG LUMBAR SPINE 2-3V
3 series · 3 of 3 positions shown · non-contrast
Comparison: None.

CLINICAL DATA: Low back pain.  Patient reports fall 1 week ago.

EXAM:
LUMBAR SPINE - 2-3 VIEW

[l-spine ap]
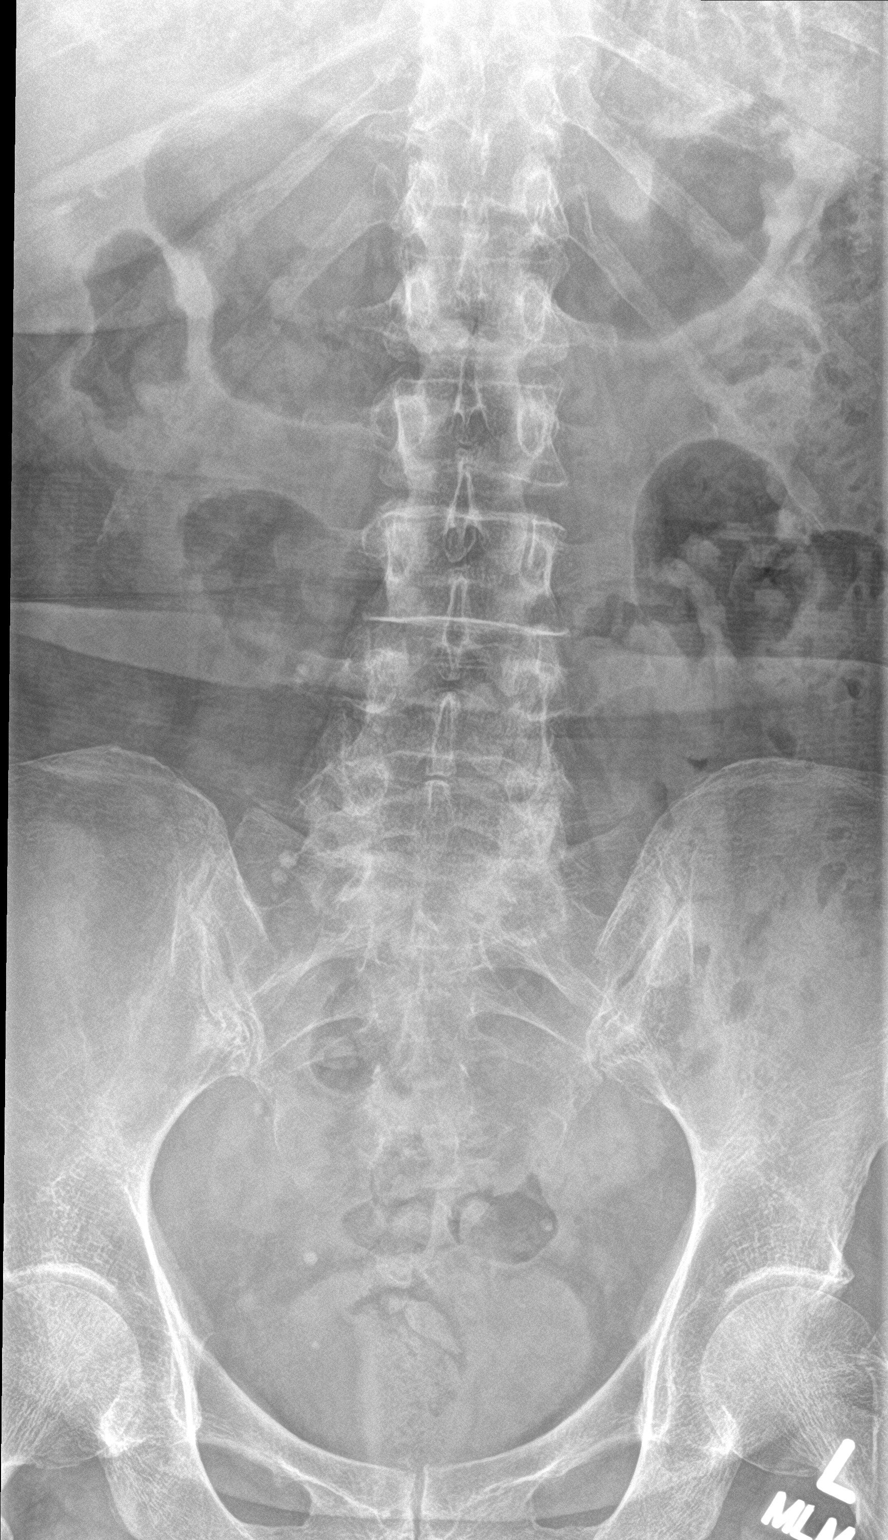

[l-spine lat]
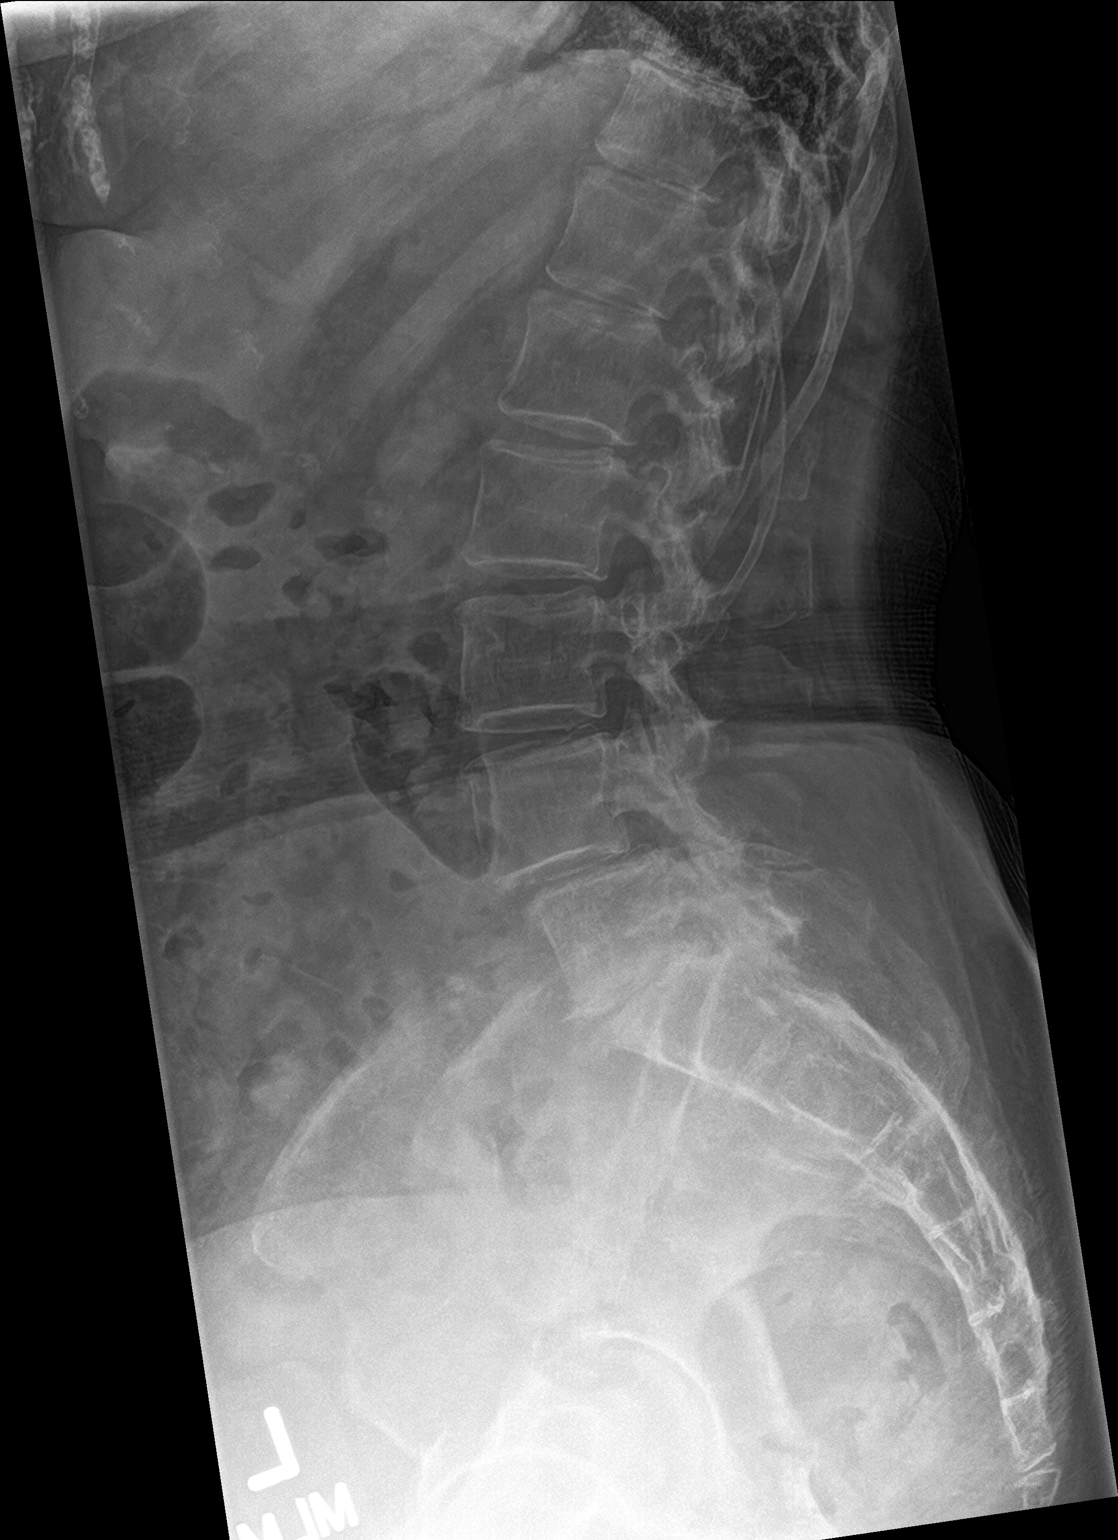

[l-spine spot]
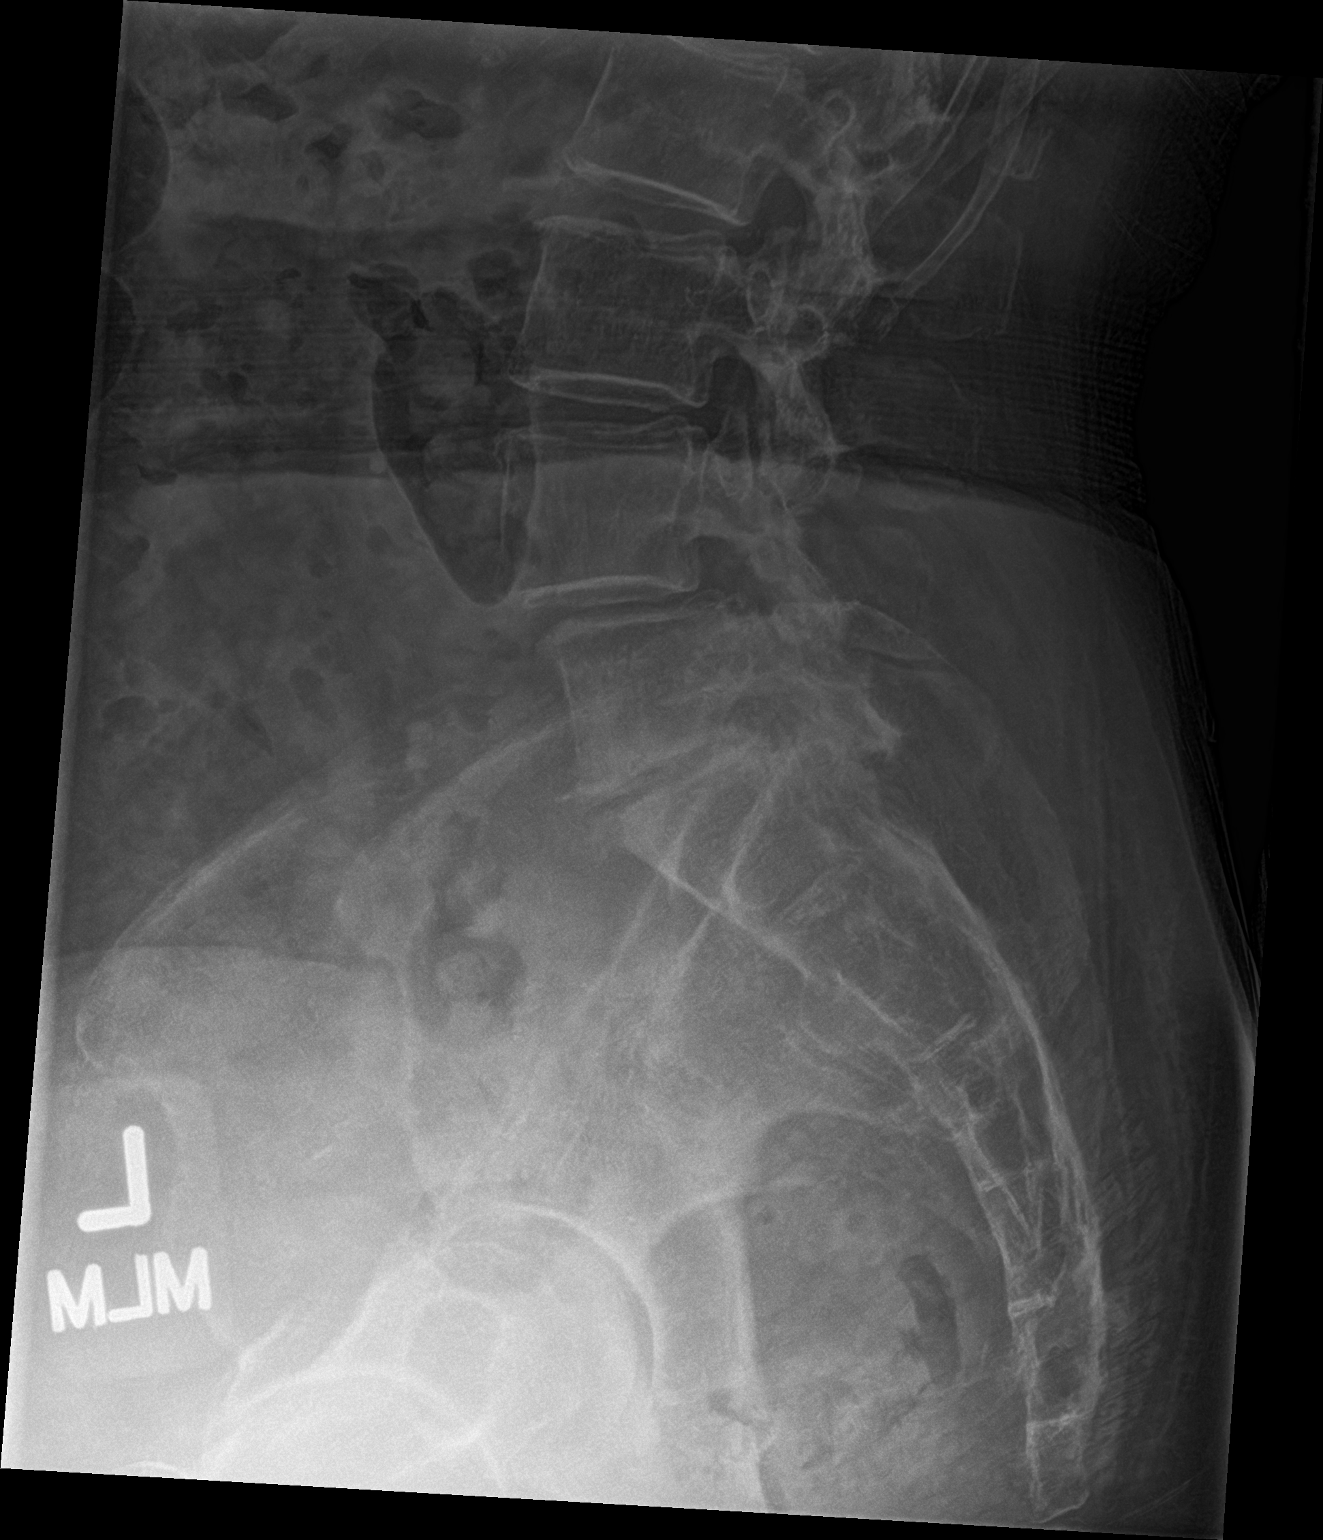

[3 of 3 positions shown; findings below may reference images not displayed]

FINDINGS: AP, lateral, and L5 lateral spot views obtained. 6 mm
anterolisthesis of L4 on L5. Alignment otherwise maintained.
Degenerative disc disease at L4-L5 and L5-S1 with disc space
narrowing and endplate spurring. There is L5-S1 facet hypertrophy.
No fracture. Vertebral body heights are preserved. Sacroiliac joints
are congruent with mild degenerative change.
IMPRESSION: 1. No acute fracture of the lumbar spine.
2. Degenerative disc disease at L4-L5 and L5-S1.
3. Facet hypertrophy in the lower lumbar spine.
4. 6 mm anterolisthesis of L4 on L5.

## 2020-06-10 IMAGING — DX DG KNEE COMPLETE 4+V*L*
5 series · 5 of 5 positions shown · non-contrast
Comparison: None.

CLINICAL DATA: Left knee pain.  Fall 1 week ago.

EXAM:
LEFT KNEE - COMPLETE 4+ VIEW

[knee ap]
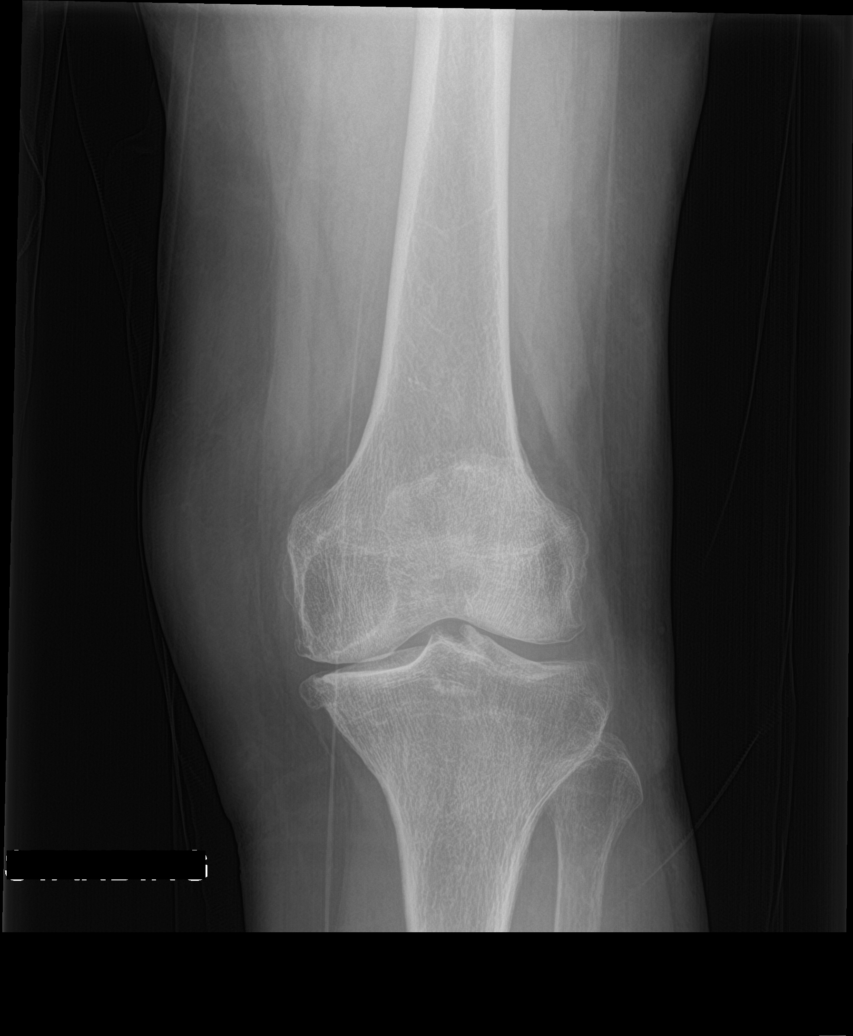

[tunnel]
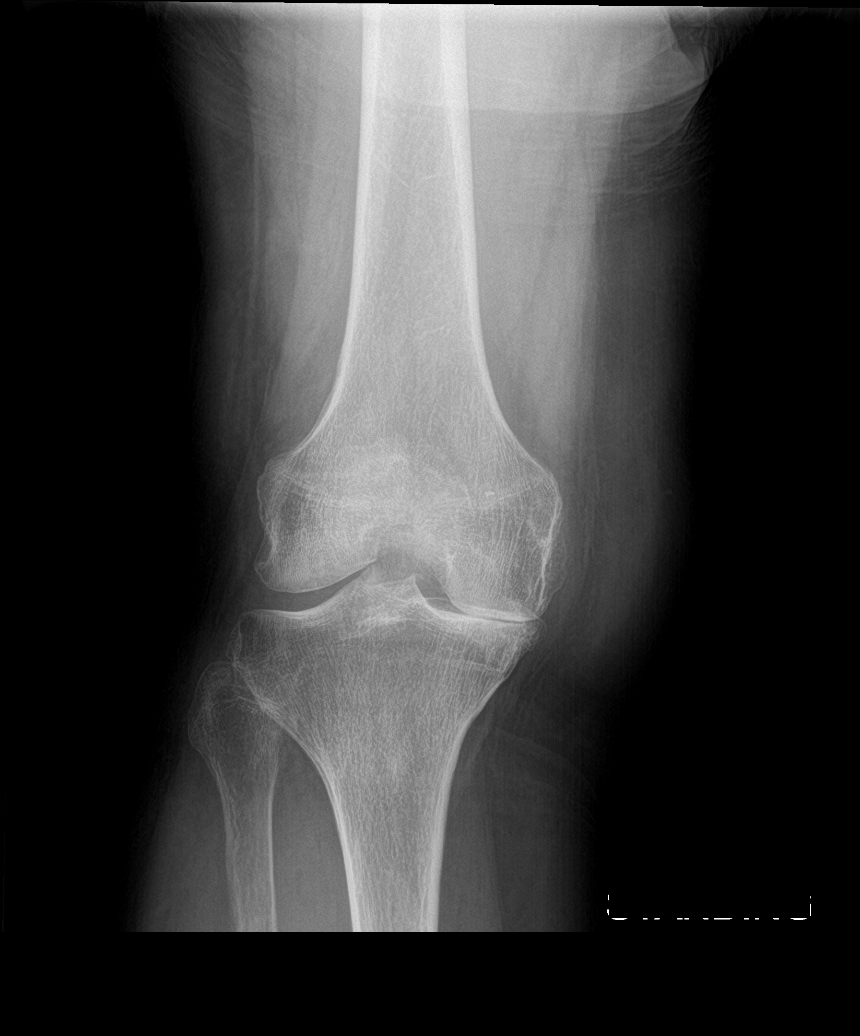

[knee lat (1 of 2)]
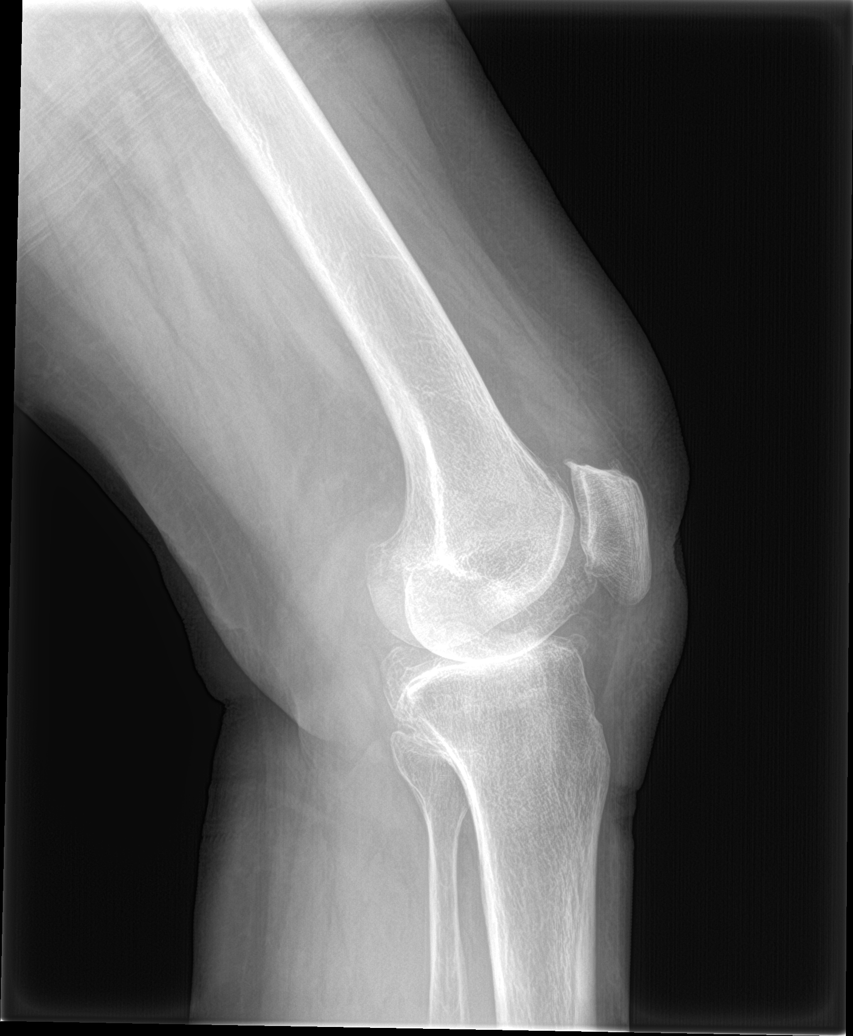

[knee sunrise]
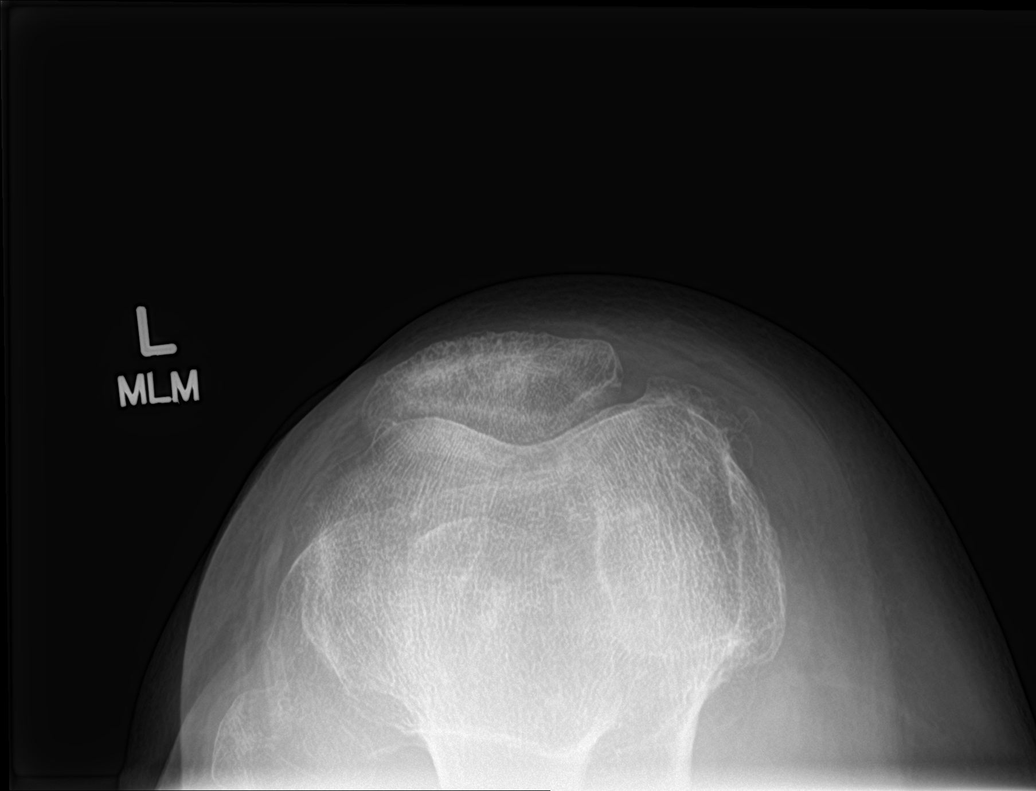

[knee lat (2 of 2)]
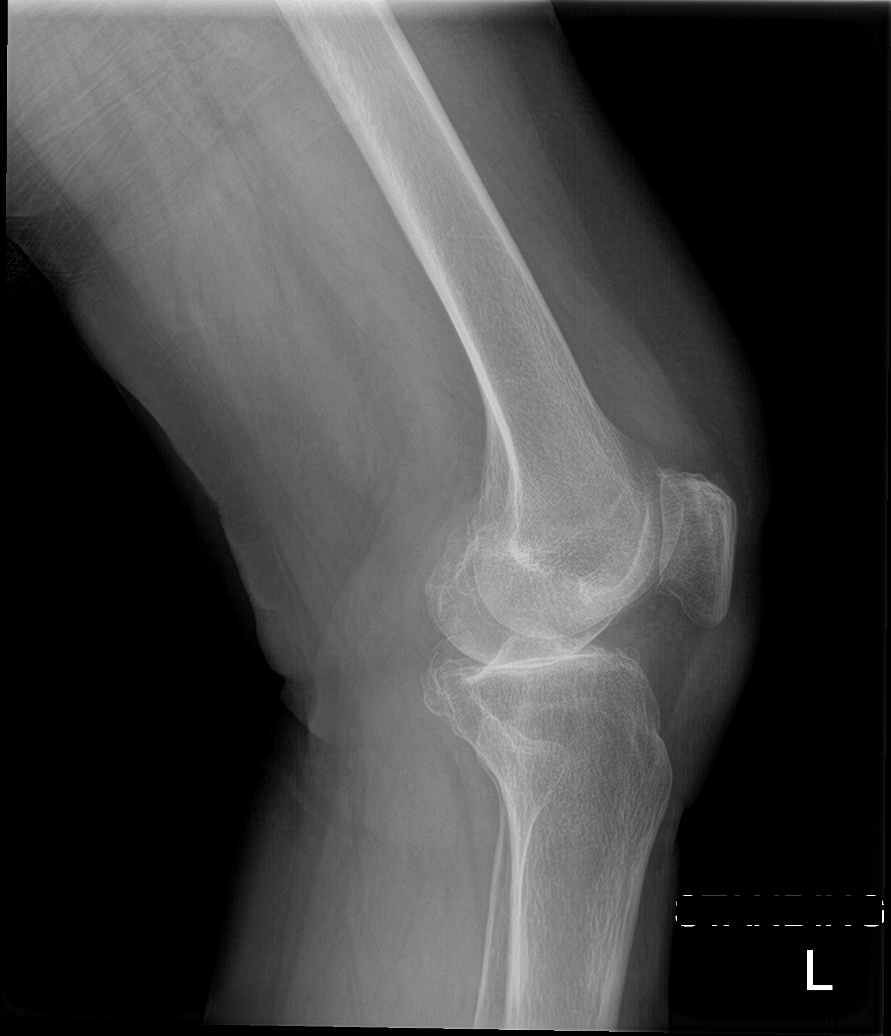

[5 of 5 positions shown; findings below may reference images not displayed]

FINDINGS: Standing AP, tunnel, and lateral views as well as patellar sunrise
view. No fracture or dislocation. Prominent medial tibiofemoral
joint space narrowing. Mild lateral tibiofemoral joint space
narrowing. Moderate tricompartmental peripheral spurring. No
significant joint effusion. No erosion or bony destruction.
IMPRESSION: Tricompartmental osteoarthritis, at least moderate in degree,
without acute fracture or subluxation.

## 2020-06-10 MED ORDER — PREDNISONE 5 MG PO TABS: ORAL_TABLET | ORAL | 0 refills | Status: DC

## 2020-06-10 NOTE — Progress Notes (Signed)
Carolyn Mckinney - 75 y.o. female MRN [id number]  Date of birth: 07-07-1945  SUBJECTIVE:  Including CC & ROS.  No chief complaint on file.   Carolyn Mckinney is a 75 y.o. female that is presenting with worsening of her left leg pain.  She is also experiencing the left knee pain as well.  She has tried Pennsaid.  Symptoms are intermittent in nature.   Review of Systems See HPI   HISTORY: Past Medical, Surgical, Social, and Family History Reviewed & Updated per EMR.   Pertinent Historical Findings include:  Past Medical History:  Diagnosis Date  . Allergy   . Cancer ([place])    Breast  . Cataract    cataract repair bilaterally  . Closed patellar sleeve fracture of right knee 05/2019  . Hypertension   . Meniere's disease     Past Surgical History:  Procedure Laterality Date  . BREAST BIOPSY Left    2001  . BREAST EXCISIONAL BIOPSY Left   . BREAST LUMPECTOMY    . COLONOSCOPY  2006  . VAGINAL HYSTERECTOMY  1991   Done for bleeding. Benign pathology    Family History  Problem Relation Age of Onset  . Arthritis Mother   . Heart disease Father   . Colon cancer Neg Hx   . Colon polyps Neg Hx   . Esophageal cancer Neg Hx   . Rectal cancer Neg Hx   . Stomach cancer Neg Hx     Social History   Socioeconomic History  . Marital status: Widowed    Spouse name: Not on file  . Number of children: Not on file  . Years of education: Not on file  . Highest education level: Not on file  Occupational History  . Not on file  Tobacco Use  . Smoking status: Never Smoker  . Smokeless tobacco: Never Used  Vaping Use  . Vaping Use: Never used  Substance and Sexual Activity  . Alcohol use: Yes    Comment: just wine/glass of wine in the evening  . Drug use: Never  . Sexual activity: Not Currently  Other Topics Concern  . Not on file  Social History Narrative   Left Handed   One Story Home   Drinks Caffeine    Social Determinants of Health   Financial Resource Strain: Low  Risk   . Difficulty of Paying Living Expenses: Not hard at all  Food Insecurity: No Food Insecurity  . Worried About Charity fundraiser in the Last Year: Never true  . Ran Out of Food in the Last Year: Never true  Transportation Needs: No Transportation Needs  . Lack of Transportation (Medical): No  . Lack of Transportation (Non-Medical): No  Physical Activity: Not on file  Stress: Not on file  Social Connections: Not on file  Intimate Partner Violence: Not on file     PHYSICAL EXAM:  VS: BP 120/80 (BP Location: Left Arm, Patient Position: Sitting, Cuff Size: Large)   Ht 5\' 1"  (1.549 m)   Wt 145 lb (65.8 kg)   BMI 27.40 kg/m  Physical Exam Gen: NAD, alert, cooperative with exam, well-appearing MSK:  Left leg: Normal strength resistance with hip flexion. Positive straight leg raise. Neurovascular intact     ASSESSMENT & PLAN:   Primary osteoarthritis of left knee Still having pain and seems oriented around the knee. -Counseled on home exercise therapy and supportive care. -X-ray. -Could consider injection.  Lumbar radiculopathy Having pain down the leg and seems more radicular  in nature   -Counseled on home exercise therapy and supportive care. -Prednisone. -Lumbar spine x-ray. -Could consider further imaging.

## 2020-06-10 NOTE — Assessment & Plan Note (Signed)
Still having pain and seems oriented around the knee. -Counseled on home exercise therapy and supportive care. -X-ray. -Could consider injection.

## 2020-06-10 NOTE — Patient Instructions (Signed)
Good to see you Please try the prednisone  Please continue the exercises  I will call with the results from today   Please send me a message in MyChart with any questions or updates.  Please see me back in 3-4 weeks .   --Dr. Raeford Razor

## 2020-06-10 NOTE — Assessment & Plan Note (Signed)
Having pain down the leg and seems more radicular in nature   -Counseled on home exercise therapy and supportive care. -Prednisone. -Lumbar spine x-ray. -Could consider further imaging.

## 2020-06-15 ENCOUNTER — Other Ambulatory Visit: Payer: Self-pay | Admitting: Family Medicine

## 2020-06-16 ENCOUNTER — Telehealth: Payer: Self-pay | Admitting: Family Medicine

## 2020-06-16 NOTE — Telephone Encounter (Signed)
Informed of her results. Can try injection for knee. May need facet injections in the back.   Rosemarie Ax, MD [facility] 06/16/2020, 8:25 AM

## 2020-06-17 ENCOUNTER — Ambulatory Visit (INDEPENDENT_AMBULATORY_CARE_PROVIDER_SITE_OTHER): Payer: Medicare Other | Admitting: Family Medicine

## 2020-06-17 ENCOUNTER — Ambulatory Visit: Payer: Self-pay

## 2020-06-17 ENCOUNTER — Other Ambulatory Visit: Payer: Self-pay

## 2020-06-17 VITALS — BP 120/70 | Ht 61.0 in | Wt 143.0 lb

## 2020-06-17 DIAGNOSIS — M47816 Spondylosis without myelopathy or radiculopathy, lumbar region: Secondary | ICD-10-CM | POA: Insufficient documentation

## 2020-06-17 DIAGNOSIS — M1712 Unilateral primary osteoarthritis, left knee: Secondary | ICD-10-CM | POA: Diagnosis not present

## 2020-06-17 MED ORDER — TRIAMCINOLONE ACETONIDE 40 MG/ML IJ SUSP
40.0000 mg | Freq: Once | INTRAMUSCULAR | Status: AC
  Administered 2020-06-17: 40 mg via INTRA_ARTICULAR

## 2020-06-17 NOTE — Assessment & Plan Note (Addendum)
Imagine demonstrating significant OA.  Clinically would suggest degenerative changes being the source of her symptoms. -Counseled on home exercise therapy and supportive care - injection.  -Could consider gel injections

## 2020-06-17 NOTE — Assessment & Plan Note (Signed)
Significant changes appreciated on recent imaging. -Referral to PM&R to consider facet injections.

## 2020-06-17 NOTE — Progress Notes (Signed)
Gracy Ehly - 75 y.o. female MRN [id number]  Date of birth: 1945-06-02  SUBJECTIVE:  Including CC & ROS.  No chief complaint on file.   Nieve Rojero is a 75 y.o. female that is presenting for a left knee injection.   Review of Systems See HPI   HISTORY: Past Medical, Surgical, Social, and Family History Reviewed & Updated per EMR.   Pertinent Historical Findings include:  Past Medical History:  Diagnosis Date  . Allergy   . Cancer ([place])    Breast  . Cataract    cataract repair bilaterally  . Closed patellar sleeve fracture of right knee 05/2019  . Hypertension   . Meniere's disease     Past Surgical History:  Procedure Laterality Date  . BREAST BIOPSY Left    2001  . BREAST EXCISIONAL BIOPSY Left   . BREAST LUMPECTOMY    . COLONOSCOPY  2006  . VAGINAL HYSTERECTOMY  1991   Done for bleeding. Benign pathology    Family History  Problem Relation Age of Onset  . Arthritis Mother   . Heart disease Father   . Colon cancer Neg Hx   . Colon polyps Neg Hx   . Esophageal cancer Neg Hx   . Rectal cancer Neg Hx   . Stomach cancer Neg Hx     Social History   Socioeconomic History  . Marital status: Widowed    Spouse name: Not on file  . Number of children: Not on file  . Years of education: Not on file  . Highest education level: Not on file  Occupational History  . Not on file  Tobacco Use  . Smoking status: Never Smoker  . Smokeless tobacco: Never Used  Vaping Use  . Vaping Use: Never used  Substance and Sexual Activity  . Alcohol use: Yes    Comment: just wine/glass of wine in the evening  . Drug use: Never  . Sexual activity: Not Currently  Other Topics Concern  . Not on file  Social History Narrative   Left Handed   One Story Home   Drinks Caffeine    Social Determinants of Health   Financial Resource Strain: Low Risk   . Difficulty of Paying Living Expenses: Not hard at all  Food Insecurity: No Food Insecurity  . Worried About Paediatric nurse in the Last Year: Never true  . Ran Out of Food in the Last Year: Never true  Transportation Needs: No Transportation Needs  . Lack of Transportation (Medical): No  . Lack of Transportation (Non-Medical): No  Physical Activity: Not on file  Stress: Not on file  Social Connections: Not on file  Intimate Partner Violence: Not on file     PHYSICAL EXAM:  VS: BP 120/70   Ht 5\' 1"  (1.549 m)   Wt 143 lb (64.9 kg)   BMI 27.02 kg/m  Physical Exam Gen: NAD, alert, cooperative with exam, well-appearing   Aspiration/Injection Procedure Note Rachal Dvorsky 1945/09/07  Procedure: Injection Indications: Left knee pain  Procedure Details Consent: Risks of procedure as well as the alternatives and risks of each were explained to the (patient/caregiver).  Consent for procedure obtained. Time Out: Verified patient identification, verified procedure, site/side was marked, verified correct patient position, special equipment/implants available, medications/allergies/relevent history reviewed, required imaging and test results available.  Performed.  The area was cleaned with iodine and alcohol swabs.    The left knee superior lateral suprapatellar pouch was injected using 1 cc's of 40 mg  Kenalog and 4 cc's of 0.25% bupivacaine with a 22 1 1/2" needle.  Ultrasound was used. Images were obtained in long views showing the injection.     A sterile dressing was applied.  Patient did tolerate procedure well.      ASSESSMENT & PLAN:   Primary osteoarthritis of left knee Imagine demonstrating significant OA.  Clinically would suggest degenerative changes being the source of her symptoms. -Counseled on home exercise therapy and supportive care - injection.  -Could consider gel injections  Facet hypertrophy of lumbar region Significant changes appreciated on recent imaging. -Referral to PM&R to consider facet injections.

## 2020-06-17 NOTE — Patient Instructions (Signed)
Good to see you Please try ice as needed  We have referred you to a PM&R doctor for your back   Please send me a message in [place] with any questions or updates.  Please see me back in 4 weeks.   --Dr. Raeford Razor

## 2020-06-17 NOTE — Addendum Note (Signed)
Addended by: Cresenciano Lick on: 06/17/2020 02:12 PM   Modules accepted: Orders

## 2020-06-22 DIAGNOSIS — M545 Low back pain, unspecified: Secondary | ICD-10-CM | POA: Diagnosis not present

## 2020-06-25 DIAGNOSIS — M545 Low back pain, unspecified: Secondary | ICD-10-CM | POA: Diagnosis not present

## 2020-06-29 DIAGNOSIS — M545 Low back pain, unspecified: Secondary | ICD-10-CM | POA: Diagnosis not present

## 2020-07-01 ENCOUNTER — Encounter: Payer: Self-pay | Admitting: Family Medicine

## 2020-07-03 ENCOUNTER — Other Ambulatory Visit: Payer: Self-pay | Admitting: Neurology

## 2020-07-03 DIAGNOSIS — R202 Paresthesia of skin: Secondary | ICD-10-CM

## 2020-07-05 DIAGNOSIS — M5416 Radiculopathy, lumbar region: Secondary | ICD-10-CM | POA: Diagnosis not present

## 2020-07-15 ENCOUNTER — Ambulatory Visit (INDEPENDENT_AMBULATORY_CARE_PROVIDER_SITE_OTHER): Payer: Medicare Other | Admitting: Family Medicine

## 2020-07-15 ENCOUNTER — Other Ambulatory Visit: Payer: Self-pay

## 2020-07-15 ENCOUNTER — Ambulatory Visit: Payer: Self-pay

## 2020-07-15 ENCOUNTER — Encounter: Payer: Self-pay | Admitting: Family Medicine

## 2020-07-15 VITALS — BP 122/76 | Ht 61.0 in | Wt 143.0 lb

## 2020-07-15 DIAGNOSIS — M1712 Unilateral primary osteoarthritis, left knee: Secondary | ICD-10-CM

## 2020-07-15 MED ORDER — KETOROLAC TROMETHAMINE 30 MG/ML IJ SOLN
30.0000 mg | Freq: Once | INTRAMUSCULAR | Status: AC
  Administered 2020-07-16: 30 mg via INTRAMUSCULAR

## 2020-07-15 NOTE — Assessment & Plan Note (Signed)
Acute exacerbation of her degenerative change.  Did get improvement with the steroid.  Pain is mostly medial in nature. -Counseled on home exercise therapy and supportive care. -Toradol injection. -Pursue gel injection.

## 2020-07-15 NOTE — Patient Instructions (Signed)
Good to see you Please use ice as needed  Please try volatren   Please send me a message in MyChart with any questions or updates.  Please see me back in 4 weeks.   --Dr. Raeford Razor

## 2020-07-15 NOTE — Progress Notes (Signed)
Carolyn Mckinney - 75 y.o. female MRN [id number]  Date of birth: 04/11/1946  SUBJECTIVE:  Including CC & ROS.  No chief complaint on file.   Carolyn Mckinney is a 75 y.o. female that is presenting with acute worsening of her left knee pain.  She has received facet injections for her lumbar spine.   Review of Systems See HPI   HISTORY: Past Medical, Surgical, Social, and Family History Reviewed & Updated per EMR.   Pertinent Historical Findings include:  Past Medical History:  Diagnosis Date  . Allergy   . Cancer ([place])    Breast  . Cataract    cataract repair bilaterally  . Closed patellar sleeve fracture of right knee 05/2019  . Hypertension   . Meniere's disease     Past Surgical History:  Procedure Laterality Date  . BREAST BIOPSY Left    2001  . BREAST EXCISIONAL BIOPSY Left   . BREAST LUMPECTOMY    . COLONOSCOPY  2006  . VAGINAL HYSTERECTOMY  1991   Done for bleeding. Benign pathology    Family History  Problem Relation Age of Onset  . Arthritis Mother   . Heart disease Father   . Colon cancer Neg Hx   . Colon polyps Neg Hx   . Esophageal cancer Neg Hx   . Rectal cancer Neg Hx   . Stomach cancer Neg Hx     Social History   Socioeconomic History  . Marital status: Widowed    Spouse name: Not on file  . Number of children: Not on file  . Years of education: Not on file  . Highest education level: Not on file  Occupational History  . Not on file  Tobacco Use  . Smoking status: Never Smoker  . Smokeless tobacco: Never Used  Vaping Use  . Vaping Use: Never used  Substance and Sexual Activity  . Alcohol use: Yes    Comment: just wine/glass of wine in the evening  . Drug use: Never  . Sexual activity: Not Currently  Other Topics Concern  . Not on file  Social History Narrative   Left Handed   One Story Home   Drinks Caffeine    Social Determinants of Health   Financial Resource Strain: Low Risk   . Difficulty of Paying Living Expenses: Not hard  at all  Food Insecurity: No Food Insecurity  . Worried About Charity fundraiser in the Last Year: Never true  . Ran Out of Food in the Last Year: Never true  Transportation Needs: No Transportation Needs  . Lack of Transportation (Medical): No  . Lack of Transportation (Non-Medical): No  Physical Activity: Not on file  Stress: Not on file  Social Connections: Not on file  Intimate Partner Violence: Not on file     PHYSICAL EXAM:  VS: BP 122/76 (BP Location: Left Arm, Patient Position: Sitting, Cuff Size: Normal)   Ht 5\' 1"  (1.549 m)   Wt 143 lb (64.9 kg)   BMI 27.02 kg/m  Physical Exam Gen: NAD, alert, cooperative with exam, well-appearing MSK:  Left knee: Mild effusion. Normal range of motion. Tenderness to palpation of the medial joint space. Neurovascular intact   Aspiration/Injection Procedure Note Carolyn Mckinney February 27, 1946  Procedure: Injection Indications: Left knee pain  Procedure Details Consent: Risks of procedure as well as the alternatives and risks of each were explained to the (patient/caregiver).  Consent for procedure obtained. Time Out: Verified patient identification, verified procedure, site/side was marked, verified correct patient  position, special equipment/implants available, medications/allergies/relevent history reviewed, required imaging and test results available.  Performed.  The area was cleaned with iodine and alcohol swabs.    The Left knee superior lateral suprapatellar pouch was injected using 1 cc's of 30 mg Toradol and 4 cc's of 0.25% bupivacaine with a 22 1 1/2" needle.  Ultrasound was used. Images were obtained in long views showing the injection.     A sterile dressing was applied.  Patient did tolerate procedure well.     ASSESSMENT & PLAN:   Primary osteoarthritis of left knee Acute exacerbation of her degenerative change.  Did get improvement with the steroid.  Pain is mostly medial in nature. -Counseled on home exercise  therapy and supportive care. -Toradol injection. -Pursue gel injection.

## 2020-07-16 DIAGNOSIS — M1712 Unilateral primary osteoarthritis, left knee: Secondary | ICD-10-CM | POA: Diagnosis not present

## 2020-07-20 ENCOUNTER — Other Ambulatory Visit: Payer: Self-pay | Admitting: Family Medicine

## 2020-07-20 DIAGNOSIS — G4700 Insomnia, unspecified: Secondary | ICD-10-CM

## 2020-07-21 DIAGNOSIS — M5416 Radiculopathy, lumbar region: Secondary | ICD-10-CM | POA: Diagnosis not present

## 2020-07-26 DIAGNOSIS — M5416 Radiculopathy, lumbar region: Secondary | ICD-10-CM | POA: Diagnosis not present

## 2020-07-27 ENCOUNTER — Other Ambulatory Visit: Payer: Self-pay | Admitting: Neurology

## 2020-07-27 DIAGNOSIS — I781 Nevus, non-neoplastic: Secondary | ICD-10-CM | POA: Diagnosis not present

## 2020-07-27 DIAGNOSIS — L821 Other seborrheic keratosis: Secondary | ICD-10-CM | POA: Diagnosis not present

## 2020-07-27 DIAGNOSIS — R202 Paresthesia of skin: Secondary | ICD-10-CM

## 2020-07-27 DIAGNOSIS — L700 Acne vulgaris: Secondary | ICD-10-CM | POA: Diagnosis not present

## 2020-07-27 DIAGNOSIS — L720 Epidermal cyst: Secondary | ICD-10-CM | POA: Diagnosis not present

## 2020-07-29 ENCOUNTER — Telehealth: Payer: Self-pay | Admitting: *Deleted

## 2020-07-29 NOTE — Telephone Encounter (Signed)
Pt informed of below.    Received summary of benefits for patient's Monovisc for left knee. Patient is totally covered. OV scheduled 08/11/20 @ 11:30. Monovisc order placed with rep.

## 2020-07-30 ENCOUNTER — Telehealth: Payer: Self-pay | Admitting: Neurology

## 2020-07-30 ENCOUNTER — Other Ambulatory Visit: Payer: Self-pay | Admitting: Neurology

## 2020-07-30 DIAGNOSIS — R202 Paresthesia of skin: Secondary | ICD-10-CM

## 2020-07-30 NOTE — Telephone Encounter (Signed)
Patient called in about a prescription that was declined for her pregabalin and duloxetine. She scheduled an appointment for 01/06/21 due to her going out of the country for several months and there not being an appointment available before she leaves at the beginning of June. She will be back at the beginning of September. She would like to see if she could get a refill to get her to her next appointment on 01/06/21? She is not sure exactly how to get insurance to let her have that large amount.

## 2020-08-02 NOTE — Telephone Encounter (Signed)
Please have patient follow-up in May to discuss medications - ok to use work-in on Thursday.

## 2020-08-02 NOTE — Telephone Encounter (Signed)
Patient is scheduled for 08/26/20 at 9:50

## 2020-08-04 ENCOUNTER — Other Ambulatory Visit: Payer: Self-pay | Admitting: Family Medicine

## 2020-08-04 DIAGNOSIS — G4700 Insomnia, unspecified: Secondary | ICD-10-CM

## 2020-08-04 DIAGNOSIS — M792 Neuralgia and neuritis, unspecified: Secondary | ICD-10-CM

## 2020-08-04 MED ORDER — LOSARTAN POTASSIUM 100 MG PO TABS: 100.0000 mg | ORAL_TABLET | Freq: Every day | ORAL | 1 refills | Status: DC

## 2020-08-04 MED ORDER — PREGABALIN 150 MG PO CAPS: 150.0000 mg | ORAL_CAPSULE | Freq: Two times a day (BID) | ORAL | 0 refills | Status: DC

## 2020-08-04 MED ORDER — VALACYCLOVIR HCL 500 MG PO TABS: ORAL_TABLET | ORAL | 0 refills | Status: DC

## 2020-08-04 MED ORDER — TRAZODONE HCL 50 MG PO TABS: 50.0000 mg | ORAL_TABLET | Freq: Every evening | ORAL | 0 refills | Status: DC | PRN

## 2020-08-04 MED ORDER — AMLODIPINE BESYLATE 5 MG PO TABS: 1.0000 | ORAL_TABLET | Freq: Every day | ORAL | 0 refills | Status: DC

## 2020-08-04 NOTE — Telephone Encounter (Signed)
Refills to take to [place]

## 2020-08-11 ENCOUNTER — Ambulatory Visit (INDEPENDENT_AMBULATORY_CARE_PROVIDER_SITE_OTHER): Payer: Medicare Other | Admitting: Family Medicine

## 2020-08-11 ENCOUNTER — Encounter: Payer: Self-pay | Admitting: Family Medicine

## 2020-08-11 ENCOUNTER — Ambulatory Visit: Payer: Medicare Other | Attending: Internal Medicine

## 2020-08-11 ENCOUNTER — Ambulatory Visit: Payer: Self-pay

## 2020-08-11 ENCOUNTER — Other Ambulatory Visit: Payer: Self-pay

## 2020-08-11 VITALS — BP 140/60 | Ht 61.0 in | Wt 143.0 lb

## 2020-08-11 DIAGNOSIS — M1712 Unilateral primary osteoarthritis, left knee: Secondary | ICD-10-CM

## 2020-08-11 DIAGNOSIS — Z23 Encounter for immunization: Secondary | ICD-10-CM

## 2020-08-11 NOTE — Patient Instructions (Signed)
Good to see you  Please use ice as needed  Please send me a message in MyChart with any questions or updates.  Please see me back in 4 weeks.   --Dr. Harper Vandervoort  

## 2020-08-11 NOTE — Assessment & Plan Note (Signed)
Monovisc injection today  - f/u in 4 weeks.

## 2020-08-11 NOTE — Progress Notes (Signed)
   Covid-19 Vaccination Clinic  Name:  Carolyn Mckinney    MRN: [id number] DOB: 03/09/1946  08/11/2020  [name] was observed post Covid-19 immunization for 15 minutes without incident. She was provided with Vaccine Information Sheet and instruction to access the V-Safe system.   [name] was instructed to call 911 with any severe reactions post vaccine: Marland Kitchen Difficulty breathing  . Swelling of face and throat  . A fast heartbeat  . A bad rash all over body  . Dizziness and weakness   Immunizations Administered    Name Date Dose VIS Date Route   PFIZER Comrnaty(Gray TOP) Covid-19 Vaccine 08/11/2020 12:05 PM 0.3 mL 04/01/2020 Intramuscular   Manufacturer: [place]   Lot: [id number]   [place]: [phone]

## 2020-08-11 NOTE — Progress Notes (Signed)
Hazel Leveille - 75 y.o. female MRN [id number]  Date of birth: 1946/02/14  SUBJECTIVE:  Including CC & ROS.  No chief complaint on file.   Neoma Uhrich is a 75 y.o. female that is here for monovisc injection.    Review of Systems See HPI   HISTORY: Past Medical, Surgical, Social, and Family History Reviewed & Updated per EMR.   Pertinent Historical Findings include:  Past Medical History:  Diagnosis Date  . Allergy   . Cancer ([place])    Breast  . Cataract    cataract repair bilaterally  . Closed patellar sleeve fracture of right knee 05/2019  . Hypertension   . Meniere's disease     Past Surgical History:  Procedure Laterality Date  . BREAST BIOPSY Left    2001  . BREAST EXCISIONAL BIOPSY Left   . BREAST LUMPECTOMY    . COLONOSCOPY  2006  . VAGINAL HYSTERECTOMY  1991   Done for bleeding. Benign pathology    Family History  Problem Relation Age of Onset  . Arthritis Mother   . Heart disease Father   . Colon cancer Neg Hx   . Colon polyps Neg Hx   . Esophageal cancer Neg Hx   . Rectal cancer Neg Hx   . Stomach cancer Neg Hx     Social History   Socioeconomic History  . Marital status: Widowed    Spouse name: Not on file  . Number of children: Not on file  . Years of education: Not on file  . Highest education level: Not on file  Occupational History  . Not on file  Tobacco Use  . Smoking status: Never Smoker  . Smokeless tobacco: Never Used  Vaping Use  . Vaping Use: Never used  Substance and Sexual Activity  . Alcohol use: Yes    Comment: just wine/glass of wine in the evening  . Drug use: Never  . Sexual activity: Not Currently  Other Topics Concern  . Not on file  Social History Narrative   Left Handed   One Story Home   Drinks Caffeine    Social Determinants of Health   Financial Resource Strain: Low Risk   . Difficulty of Paying Living Expenses: Not hard at all  Food Insecurity: No Food Insecurity  . Worried About Sales executive in the Last Year: Never true  . Ran Out of Food in the Last Year: Never true  Transportation Needs: No Transportation Needs  . Lack of Transportation (Medical): No  . Lack of Transportation (Non-Medical): No  Physical Activity: Not on file  Stress: Not on file  Social Connections: Not on file  Intimate Partner Violence: Not on file     PHYSICAL EXAM:  VS: BP 140/60 (BP Location: Left Arm, Patient Position: Sitting, Cuff Size: Normal)   Ht 5\' 1"  (1.549 m)   Wt 143 lb (64.9 kg)   BMI 27.02 kg/m  Physical Exam Gen: NAD, alert, cooperative with exam, well-appearing    Aspiration/Injection Procedure Note Danyal Adorno September 30, 1945  Procedure: Injection Indications: left knee pain   Procedure Details Consent: Risks of procedure as well as the alternatives and risks of each were explained to the (patient/caregiver).  Consent for procedure obtained. Time Out: Verified patient identification, verified procedure, site/side was marked, verified correct patient position, special equipment/implants available, medications/allergies/relevent history reviewed, required imaging and test results available.  Performed.  The area was cleaned with iodine and alcohol swabs.    The left knee superior  lateral suprapatellar pouch was injected using 4 cc's of 1% lidocaine with a 22 1 1/2" needle.  The syringe was switched and 4 mL 22 mg/mL was injected. Ultrasound was used. Images were obtained in  Long views showing the injection.    A sterile dressing was applied.  Patient did tolerate procedure well.     ASSESSMENT & PLAN:   Primary osteoarthritis of left knee Monovisc injection today  - f/u in 4 weeks.

## 2020-08-12 ENCOUNTER — Other Ambulatory Visit: Payer: Self-pay | Admitting: Family Medicine

## 2020-08-12 DIAGNOSIS — G4700 Insomnia, unspecified: Secondary | ICD-10-CM

## 2020-08-13 ENCOUNTER — Other Ambulatory Visit (HOSPITAL_BASED_OUTPATIENT_CLINIC_OR_DEPARTMENT_OTHER): Payer: Self-pay

## 2020-08-13 DIAGNOSIS — M25562 Pain in left knee: Secondary | ICD-10-CM | POA: Diagnosis not present

## 2020-08-13 DIAGNOSIS — Z23 Encounter for immunization: Secondary | ICD-10-CM | POA: Diagnosis not present

## 2020-08-13 MED ORDER — PFIZER-BIONT COVID-19 VAC-TRIS 30 MCG/0.3ML IM SUSP
INTRAMUSCULAR | 0 refills | Status: DC
  Filled 2020-08-13: qty 0.3, 1d supply, fill #0

## 2020-08-17 ENCOUNTER — Encounter: Payer: Self-pay | Admitting: Family Medicine

## 2020-08-17 ENCOUNTER — Ambulatory Visit (INDEPENDENT_AMBULATORY_CARE_PROVIDER_SITE_OTHER): Payer: Medicare Other | Admitting: Family Medicine

## 2020-08-17 ENCOUNTER — Other Ambulatory Visit: Payer: Self-pay

## 2020-08-17 VITALS — BP 122/80 | HR 76 | Temp 97.4°F | Ht 61.0 in | Wt 142.0 lb

## 2020-08-17 DIAGNOSIS — G4700 Insomnia, unspecified: Secondary | ICD-10-CM

## 2020-08-17 DIAGNOSIS — M5416 Radiculopathy, lumbar region: Secondary | ICD-10-CM | POA: Diagnosis not present

## 2020-08-17 DIAGNOSIS — I10 Essential (primary) hypertension: Secondary | ICD-10-CM | POA: Diagnosis not present

## 2020-08-17 LAB — LIPID PANEL
Cholesterol: 173 mg/dL (ref 0–200)
HDL: 63.1 mg/dL (ref >39.00)
LDL Cholesterol: 85 mg/dL (ref 0–99)
NonHDL: 110.31
Total CHOL/HDL Ratio: 3
Triglycerides: 126 mg/dL (ref 0.0–149.0)
VLDL: 25.2 mg/dL (ref 0.0–40.0)

## 2020-08-17 LAB — COMPREHENSIVE METABOLIC PANEL
ALT: 19 U/L (ref 0–35)
AST: 21 U/L (ref 0–37)
Albumin: 4.1 g/dL (ref 3.5–5.2)
Alkaline Phosphatase: 48 U/L (ref 39–117)
BUN: 16 mg/dL (ref 6–23)
CO2: 33 mEq/L — ABNORMAL HIGH (ref 19–32)
Calcium: 9.3 mg/dL (ref 8.4–10.5)
Chloride: 100 mEq/L (ref 96–112)
Creatinine, Ser: 0.75 mg/dL (ref 0.40–1.20)
GFR: 77.81 mL/min (ref >60.00)
Glucose, Bld: 97 mg/dL (ref 70–99)
Potassium: 3.9 mEq/L (ref 3.5–5.1)
Sodium: 139 mEq/L (ref 135–145)
Total Bilirubin: 0.7 mg/dL (ref 0.2–1.2)
Total Protein: 6.4 g/dL (ref 6.0–8.3)

## 2020-08-17 NOTE — Progress Notes (Signed)
Chief Complaint  Patient presents with  . Follow-up    Subjective Carolyn Mckinney is a 75 y.o. female who presents for hypertension follow up. She does not monitor home blood pressures. She is compliant with medications- Norvasc 5 mg/d, losartan 100 mg/d. Patient has these side effects of medication: none She is adhering to a healthy diet overall. Current exercise: walking No CP or SOB.   Insomnia She takes trazodone 100 mg/d. Tolerating well. Working well overall.  She will sometimes take To help her sleep as well.  No adverse effects.   Past Medical History:  Diagnosis Date  . Allergy   . Cancer ([place])    Breast  . Cataract    cataract repair bilaterally  . Closed patellar sleeve fracture of right knee 05/2019  . Hypertension   . Meniere's disease     Exam BP 122/80 (BP Location: Left Arm, Patient Position: Sitting, Cuff Size: Normal)   Pulse 76   Temp (!) 97.4 F (36.3 C) (Oral)   Ht 5\' 1"  (1.549 m)   Wt 142 lb (64.4 kg)   SpO2 97%   BMI 26.83 kg/m  General:  well developed, well nourished, in no apparent distress Heart: RRR, no bruits, 2+ pitting bilateral LE edema Lungs: clear to auscultation, no accessory muscle use Psych: well oriented with normal range of affect and appropriate judgment/insight  Essential hypertension - Plan: Lipid panel, Comprehensive metabolic panel  Insomnia, unspecified type  1.  Stable, chronic.  Continue Norvasc 5 mg daily, losartan 50 mg daily.  Counseled on diet and exercise. 2.  Continue trazodone 100 mg nightly. She is traveling to [place] this summer and will send me a message in 3 weeks and I will send in 90 day supplies of her Valtrex, trazodone, Norvasc, and losartan. F/u in 6 months. The patient voiced understanding and agreement to the plan.  [place], DO 08/17/20  12:03 PM

## 2020-08-17 NOTE — Patient Instructions (Signed)
Have a great trip.  Let me know in mid May if/when you need a refill and I will send in 90 day supplies.  If insurance won't pay for refills, let me know.  Give [place] 2-3 business days to get the results of your labs back.   Keep the diet clean and stay active.  Let us know if you need anything.

## 2020-08-25 NOTE — Progress Notes (Signed)
Follow-up Visit   Date: 08/26/20   Carolyn Mckinney MRN: [id number] DOB: May 14, 1945   Interim History: Carolyn Mckinney is a 75 y.o. left-handed Caucasian female returning to the clinic for follow-up of bilateral feet pain.  The patient was accompanied to the clinic by self.  She is here requesting refills for Cymbalta as she will be traveling to [place] this summer for three months.  She is leaving in June.  She has been out of her Cymbalta for the past 3-4 weeks.  She has noticed increased cold sensation and pain.  She is also taking Lyrica 150mg  BID which is prescribed by her PCP. Pain seems to be controlled on this regimen.    Her NCS/EMG was normal. She has tried gabapentin, nortriptyline, and venlafaxine with no benefit.   Medications:  Current Outpatient Medications on File Prior to Visit  Medication Sig Dispense Refill  . amLODipine (NORVASC) 5 MG tablet Take 1 tablet (5 mg total) by mouth daily. 90 tablet 0  . ASPIRIN PO Take by mouth.    . Calcium Carb-Cholecalciferol (CALCIUM + D3 PO) Take by mouth.    . cefadroxil (DURICEF) 500 MG capsule     . COVID-19 mRNA Vac-TriS, Pfizer, (PFIZER-BIONT COVID-19 VAC-TRIS) SUSP injection Inject into the muscle. 0.3 mL 0  . COVID-19 mRNA vaccine, Pfizer, 30 MCG/0.3ML injection INJECT AS DIRECTED .3 mL 0  . Diclofenac Sodium (PENNSAID) 2 % SOLN Apply 2 pumps BID to the affected area 112 g 2  . Ferrous Sulfate (IRON PO) Take by mouth.    . losartan (COZAAR) 100 MG tablet Take 1 tablet (100 mg total) by mouth daily. 90 tablet 1  . meloxicam (MOBIC) 15 MG tablet Take 1 tablet by mouth daily.    . Multiple Vitamin (MULTIVITAMIN ADULT PO) Take by mouth.    . pregabalin (LYRICA) 150 MG capsule Take 1 capsule (150 mg total) by mouth 2 (two) times daily. 180 capsule 0  . traZODone (DESYREL) 50 MG tablet TAKE 1 TO 2 TABLETS BY MOUTH AT BEDTIME AS NEEDED FOR SLEEP 60 tablet 0  . valACYclovir (VALTREX) 500 MG tablet TAKE 4 TABLETS BY MOUTH AT ONSET  OF BREAKOUT AND  REPEAT IN 12 HOURS. TAKE ONE TABLET BY MOUTH DAILY OTHERWISE 90 tablet 0  . VITAMIN K PO Take by mouth.     No current facility-administered medications on file prior to visit.    Allergies: No Known Allergies  Vital Signs:  BP 118/64   Pulse 77   Ht 5\' 1"  (1.549 m)   Wt 142 lb (64.4 kg)   SpO2 96%   BMI 26.83 kg/m   Neurological Exam: MENTAL STATUS including orientation to time, place, person is normal.  Speech is slow and there is delayed throught process. No dysarthria  CRANIAL NERVES:  Pupils equal round and reactive to light.  Normal conjugate, extra-ocular eye movements in all directions of gaze.  No ptosis.    MOTOR:  Motor strength is 5/5 in all extremities, including distally in the feet.  No atrophy, fasciculations or abnormal movements.  No pronator drift.  Tone is normal.    MSRs:  Reflexes are 2+/4 throughout.  SENSORY:  Intact to vibration throughout.  COORDINATION/GAIT:  Gait is unassisted, nonphysiologic   Data: NCS/EMG bilateral legs 06/08/2018:  Normal   IMPRESSION/PLAN: Bilateral feet paresthesias and pain.  No evidence of neuropathy on EMG  - Refills provided for Cymbalta 60mg  daily  - Continue Lyrica 150mg  BID as prescribed by PCP  Return to clinic in 1 year  Thank you for allowing me to participate in patient's care.  If I can answer any additional questions, I would be pleased to do so.    Sincerely,    Zettie Gootee K. Posey Pronto, DO

## 2020-08-26 ENCOUNTER — Encounter: Payer: Self-pay | Admitting: Neurology

## 2020-08-26 ENCOUNTER — Ambulatory Visit (INDEPENDENT_AMBULATORY_CARE_PROVIDER_SITE_OTHER): Payer: Medicare Other | Admitting: Neurology

## 2020-08-26 ENCOUNTER — Other Ambulatory Visit: Payer: Self-pay

## 2020-08-26 VITALS — BP 118/64 | HR 77 | Ht 61.0 in | Wt 142.0 lb

## 2020-08-26 DIAGNOSIS — R202 Paresthesia of skin: Secondary | ICD-10-CM | POA: Diagnosis not present

## 2020-08-26 MED ORDER — DULOXETINE HCL 60 MG PO CPEP: 60.0000 mg | ORAL_CAPSULE | Freq: Every day | ORAL | 0 refills | Status: DC

## 2020-08-26 MED ORDER — DULOXETINE HCL 60 MG PO CPEP: 60.0000 mg | ORAL_CAPSULE | Freq: Every day | ORAL | 3 refills | Status: DC

## 2020-08-26 NOTE — Patient Instructions (Signed)
Return to clinic in 1 year.

## 2020-08-27 ENCOUNTER — Other Ambulatory Visit: Payer: Self-pay | Admitting: Family Medicine

## 2020-08-27 DIAGNOSIS — M792 Neuralgia and neuritis, unspecified: Secondary | ICD-10-CM

## 2020-08-27 MED ORDER — PREGABALIN 150 MG PO CAPS: 150.0000 mg | ORAL_CAPSULE | Freq: Two times a day (BID) | ORAL | 0 refills | Status: DC

## 2020-08-27 MED ORDER — PREGABALIN 150 MG PO CAPS: 150.0000 mg | ORAL_CAPSULE | Freq: Two times a day (BID) | ORAL | 1 refills | Status: DC

## 2020-08-30 ENCOUNTER — Encounter (HOSPITAL_BASED_OUTPATIENT_CLINIC_OR_DEPARTMENT_OTHER): Payer: Self-pay | Admitting: *Deleted

## 2020-08-30 ENCOUNTER — Emergency Department (HOSPITAL_BASED_OUTPATIENT_CLINIC_OR_DEPARTMENT_OTHER)
Admission: EM | Admit: 2020-08-30 | Discharge: 2020-08-30 | Disposition: A | Discharge status: Home / Self Care | Payer: Medicare Other | Attending: Emergency Medicine | Admitting: Emergency Medicine

## 2020-08-30 ENCOUNTER — Emergency Department (HOSPITAL_BASED_OUTPATIENT_CLINIC_OR_DEPARTMENT_OTHER): Payer: Medicare Other

## 2020-08-30 ENCOUNTER — Other Ambulatory Visit: Payer: Self-pay

## 2020-08-30 DIAGNOSIS — I10 Essential (primary) hypertension: Secondary | ICD-10-CM | POA: Insufficient documentation

## 2020-08-30 DIAGNOSIS — S0990XA Unspecified injury of head, initial encounter: Secondary | ICD-10-CM | POA: Insufficient documentation

## 2020-08-30 DIAGNOSIS — Z79899 Other long term (current) drug therapy: Secondary | ICD-10-CM | POA: Insufficient documentation

## 2020-08-30 DIAGNOSIS — Z9012 Acquired absence of left breast and nipple: Secondary | ICD-10-CM | POA: Insufficient documentation

## 2020-08-30 DIAGNOSIS — S0592XA Unspecified injury of left eye and orbit, initial encounter: Secondary | ICD-10-CM | POA: Diagnosis present

## 2020-08-30 DIAGNOSIS — S0285XA Fracture of orbit, unspecified, initial encounter for closed fracture: Secondary | ICD-10-CM

## 2020-08-30 DIAGNOSIS — S0232XA Fracture of orbital floor, left side, initial encounter for closed fracture: Secondary | ICD-10-CM | POA: Diagnosis not present

## 2020-08-30 DIAGNOSIS — Z0389 Encounter for observation for other suspected diseases and conditions ruled out: Secondary | ICD-10-CM | POA: Diagnosis not present

## 2020-08-30 DIAGNOSIS — W01198A Fall on same level from slipping, tripping and stumbling with subsequent striking against other object, initial encounter: Secondary | ICD-10-CM | POA: Insufficient documentation

## 2020-08-30 DIAGNOSIS — Z853 Personal history of malignant neoplasm of breast: Secondary | ICD-10-CM | POA: Insufficient documentation

## 2020-08-30 DIAGNOSIS — S02841A Fracture of lateral orbital wall, right side, initial encounter for closed fracture: Secondary | ICD-10-CM | POA: Diagnosis not present

## 2020-08-30 DIAGNOSIS — Z7982 Long term (current) use of aspirin: Secondary | ICD-10-CM | POA: Diagnosis not present

## 2020-08-30 IMAGING — CT CT HEAD W/O CM
3 series · 14 of 47 positions shown, 16 images · non-contrast
Comparison: None.

CLINICAL DATA: Head trauma hit left side of face on carpet

EXAM:
CT HEAD WITHOUT CONTRAST
CT MAXILLOFACIAL WITHOUT CONTRAST
CT CERVICAL SPINE WITHOUT CONTRAST
TECHNIQUE: Multidetector CT imaging of the head, cervical spine, and
maxillofacial structures were performed using the standard protocol
without intravenous contrast. Multiplanar CT image reconstructions
of the cervical spine and maxillofacial structures were also
generated.

[Series 1: head wo · axial · 0.41mm/px · z∈[-155,-30]mm · 8 of 30 slices shown, 10 images]
[im 3/30  brain]
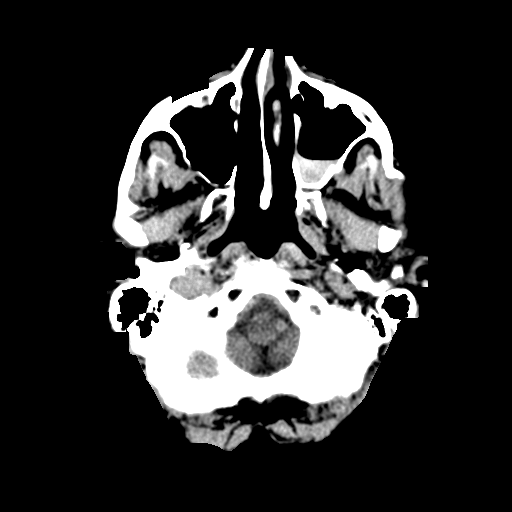
[im 3/30  bone]
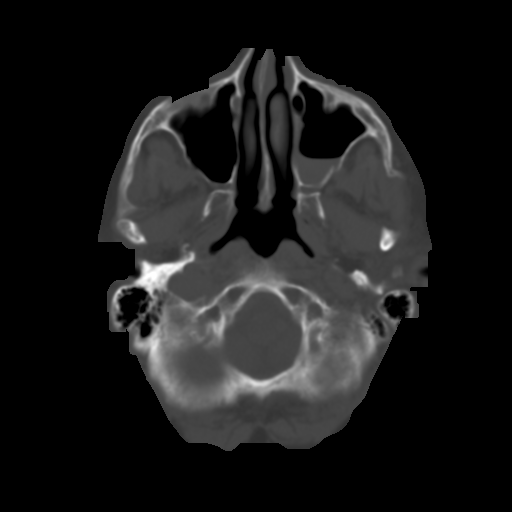
[im 7/30  brain]
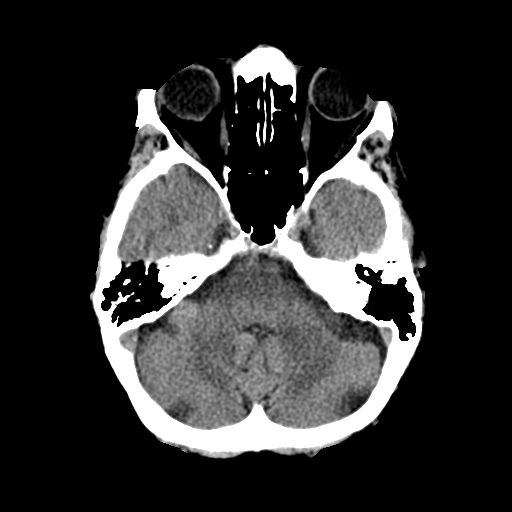
[im 10/30  brain]
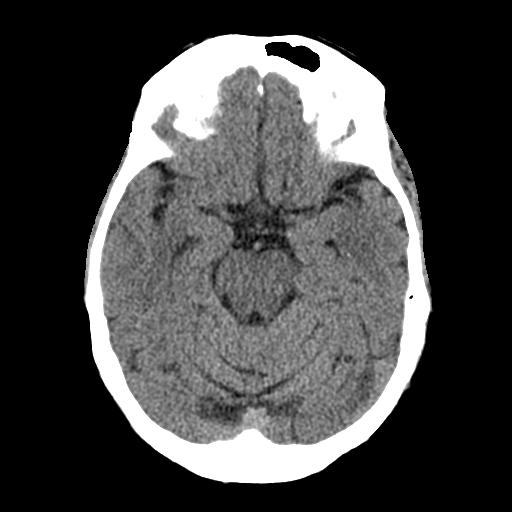
[im 14/30  brain]
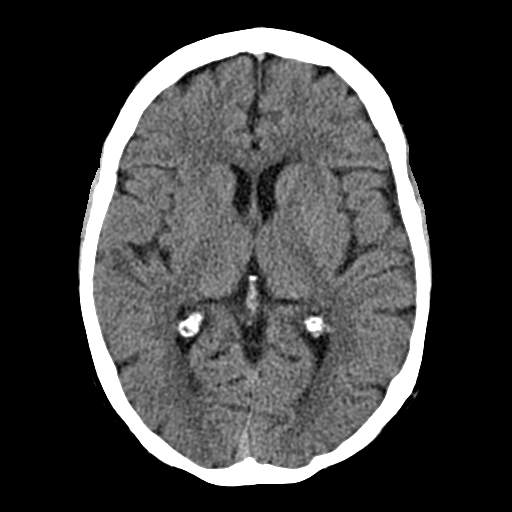
[im 17/30  brain]
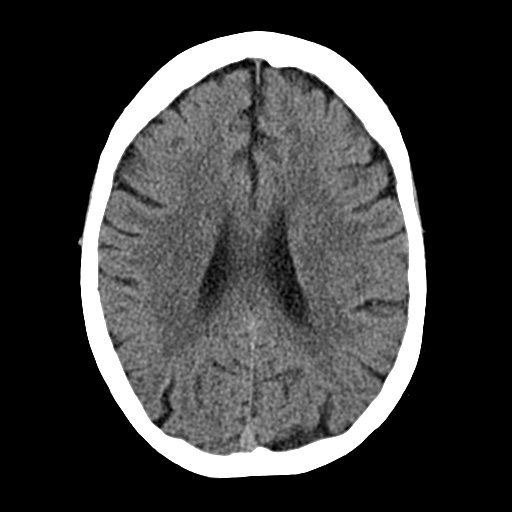
[im 17/30  bone]
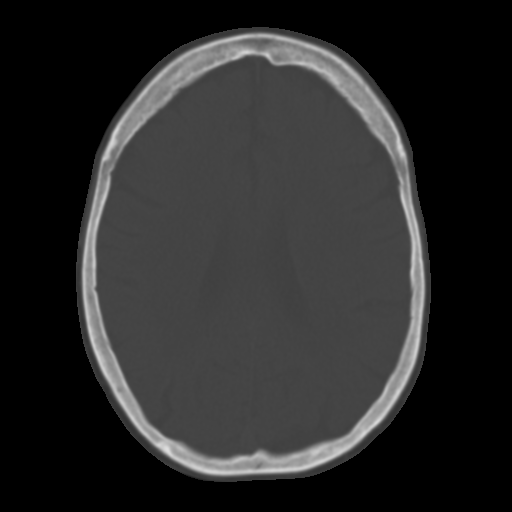
[im 21/30  brain]
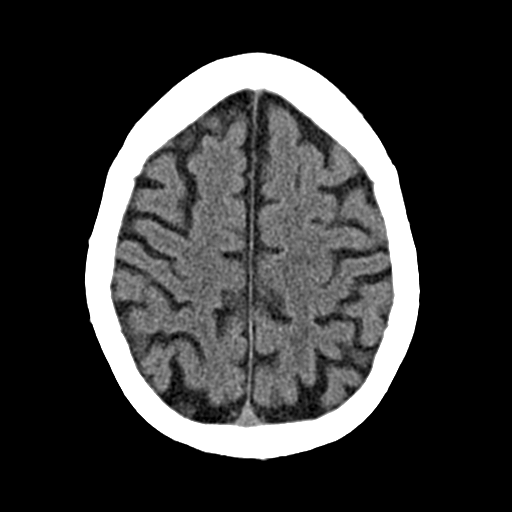
[im 24/30  brain]
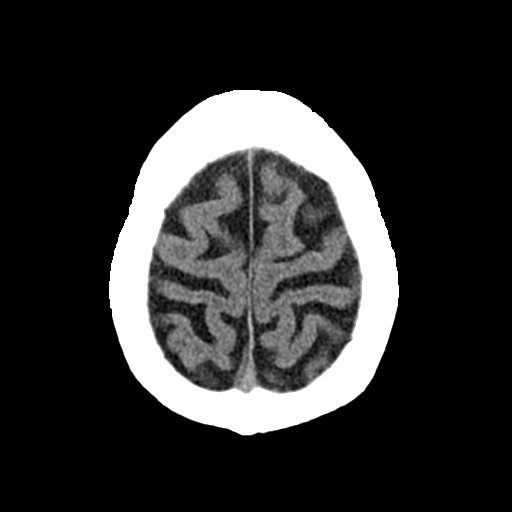
[im 28/30  brain]
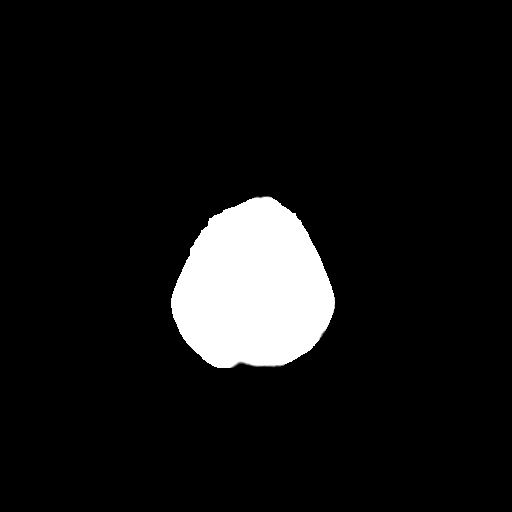

[Series 5: head coronal · coronal · 0.30mm/px · 3 of 84 slices shown]
[im 28/84  brain]
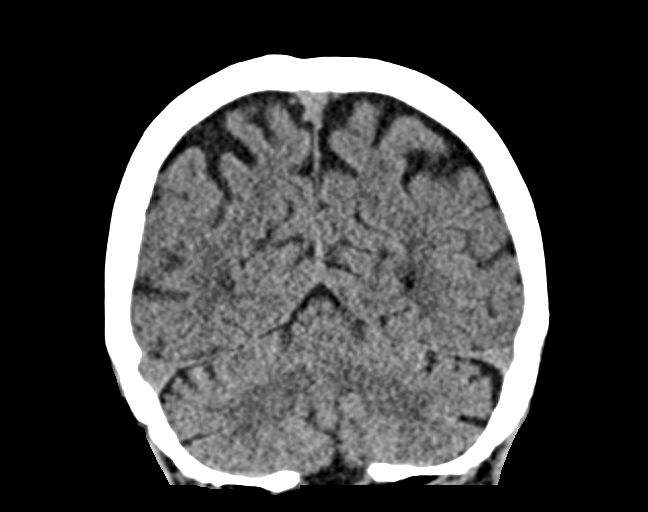
[im 37/84  brain]
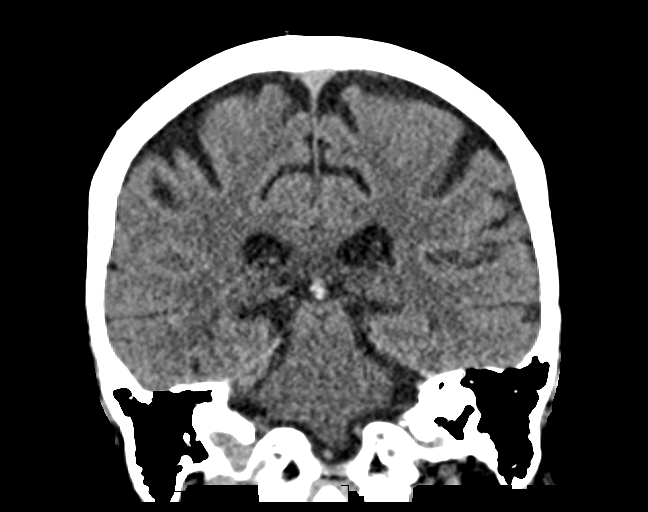
[im 47/84  brain]
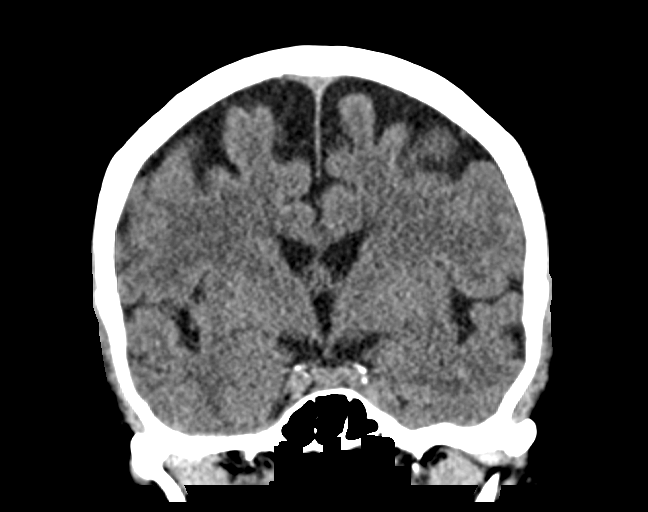

[Series 6: head sagittal · sagittal · 0.30mm/px · 3 of 84 slices shown]
[im 28/84  brain]
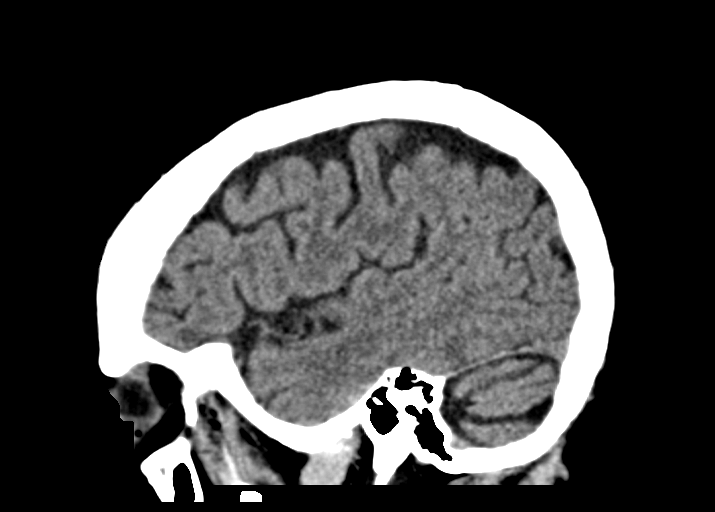
[im 42/84  brain]
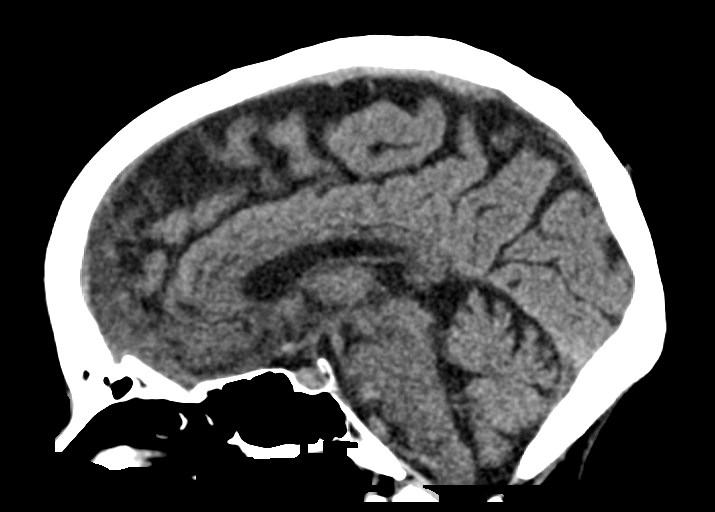
[im 56/84  brain]
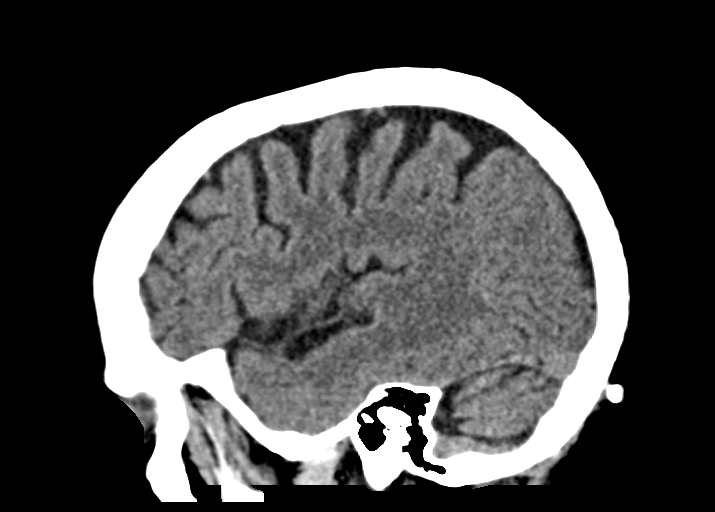

[14 of 47 positions shown; findings below may reference images not displayed]

FINDINGS: CT HEAD FINDINGS

Brain: No acute territorial infarction, hemorrhage or intracranial
mass. Mild atrophy. Nonenlarged ventricles.

Vascular: No hyperdense vessels. Scattered carotid vascular
calcification

Skull: Normal. Negative for fracture or focal lesion.

Other: None

CT MAXILLOFACIAL FINDINGS

Osseous: Mastoid air cells are clear. Mandibular heads are normally
position. No mandibular fracture. Pterygoid plates are intact. No
acute nasal bone fracture. Probable nondisplaced fracture involving
the anterior nasal spine of the maxilla. Acute appearing angular
fracture of the left zygomatic arch.

Orbits: Acute minimally displaced fracture involving the lateral
wall of left orbit. Globes appear intact. No intra or extraconal
soft tissue abnormality.

Sinuses: Small left hemosinus. Acute mildly displaced fracture
involving the left posterolateral wall of the maxillary sinus.

Soft tissues: Mild left facial soft tissue swelling

CT CERVICAL SPINE FINDINGS

Alignment: Straightening of the cervical spine. Trace
anterolisthesis C4 on C5. Mild widening of the atlantal axial
interval, measuring up to 3.6 mm. Slight anterior offset of spinal
laminar line at C1. Facet alignment is maintained.

Skull base and vertebrae: No acute fracture. No primary bone lesion
or focal pathologic process.

Soft tissues and spinal canal: No prevertebral fluid or swelling. No
visible canal hematoma.

Disc levels: Moderate disc space narrowing C4-C5 with advanced disc
space narrowing and degenerative change C5-C6 and C6-C7. facet
degenerative changes at multiple levels.

Upper chest: Negative.

Other: None
IMPRESSION: 1. No CT evidence for acute intracranial abnormality.  Mild atrophy.
2. Straightening of the cervical spine. Mild widening of the
atlantoaxial interval, raises concern for ligamentous injury in the
setting of trauma. Suggest correlation with MRI.
3. Acute appearing fractures involving the left zygomatic arch,
lateral wall of left orbit, posterolateral wall of left maxillary
sinus, and anterior nasal spine of the maxilla.

## 2020-08-30 IMAGING — CT CT MAXILLOFACIAL W/O CM
3 series · 14 of 47 positions shown, 16 images · non-contrast
Comparison: None.

CLINICAL DATA: Head trauma hit left side of face on carpet

EXAM:
CT HEAD WITHOUT CONTRAST
CT MAXILLOFACIAL WITHOUT CONTRAST
CT CERVICAL SPINE WITHOUT CONTRAST
TECHNIQUE: Multidetector CT imaging of the head, cervical spine, and
maxillofacial structures were performed using the standard protocol
without intravenous contrast. Multiplanar CT image reconstructions
of the cervical spine and maxillofacial structures were also
generated.

[Series 1: max soft · axial · 0.33mm/px · z∈[-245,-107]mm · 8 of 81 slices shown, 10 images]
[im 6/81  brain]
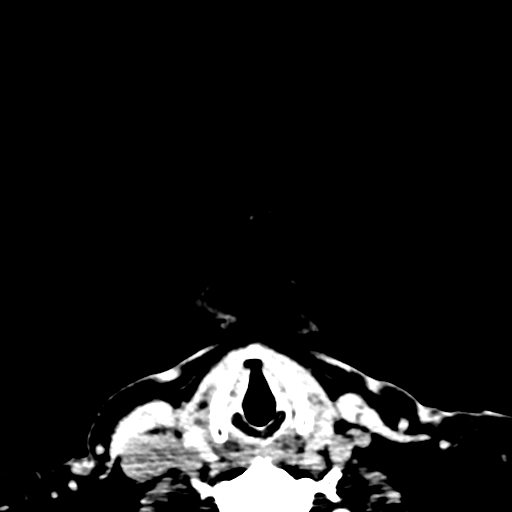
[im 6/81  bone]
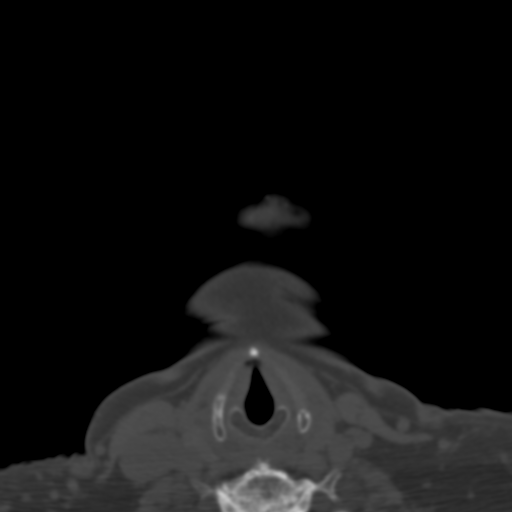
[im 17/81  bone]
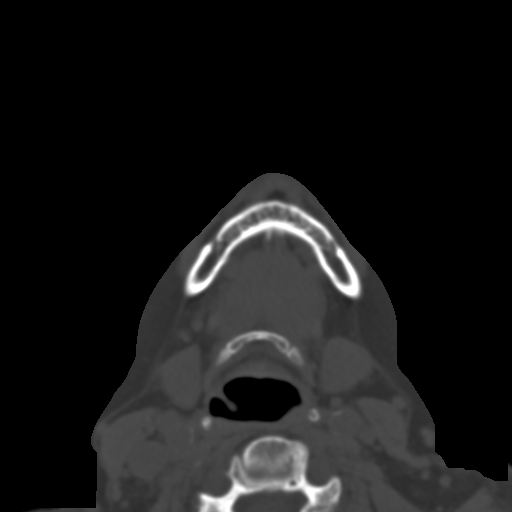
[im 25/81  bone]
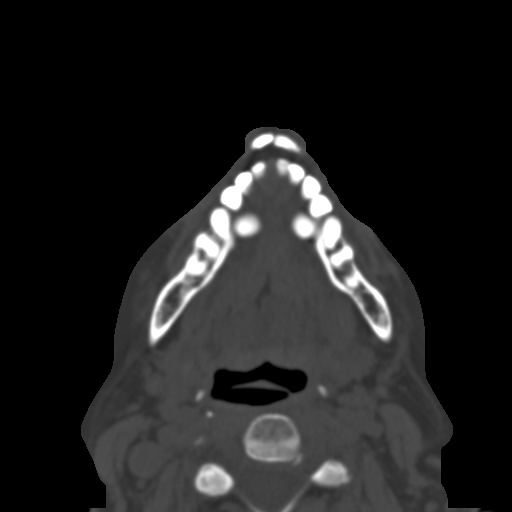
[im 36/81  bone]
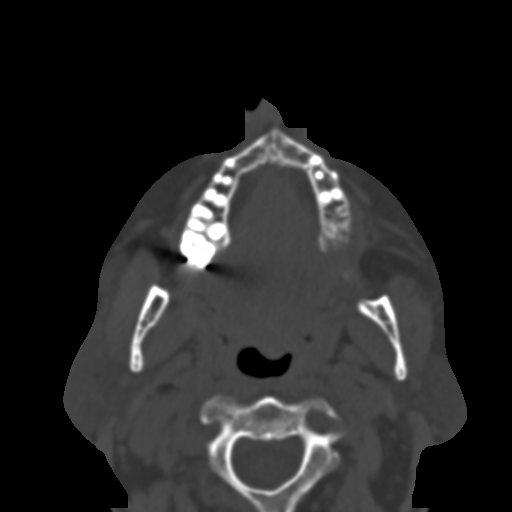
[im 45/81  brain]
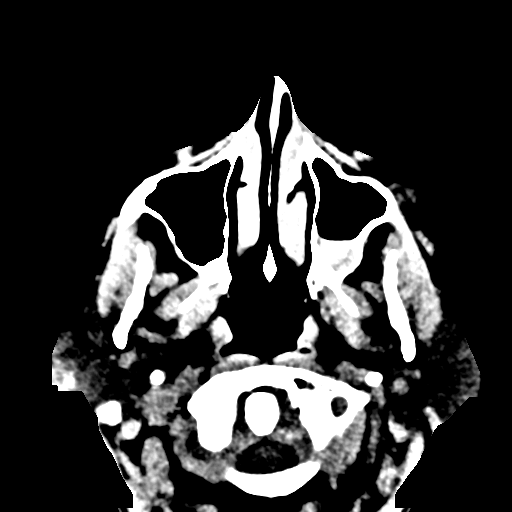
[im 45/81  bone]
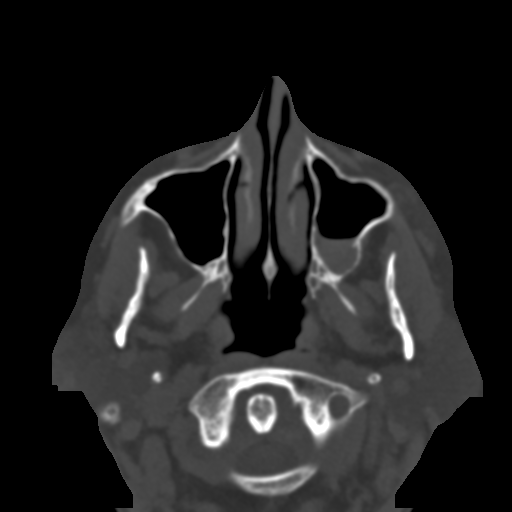
[im 56/81  bone]
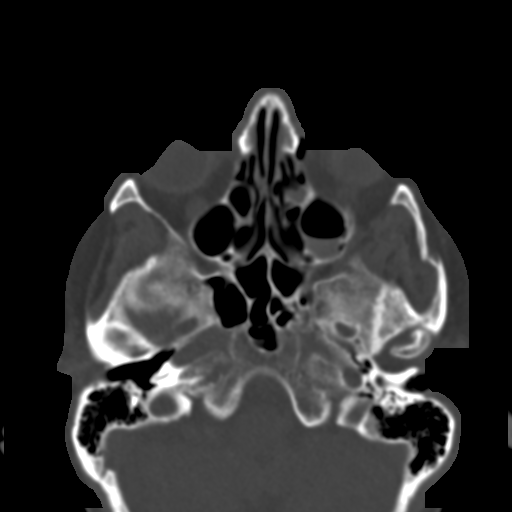
[im 64/81  bone]
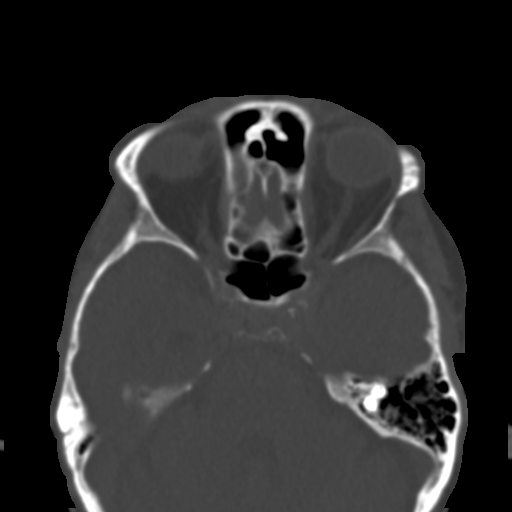
[im 75/81  bone]
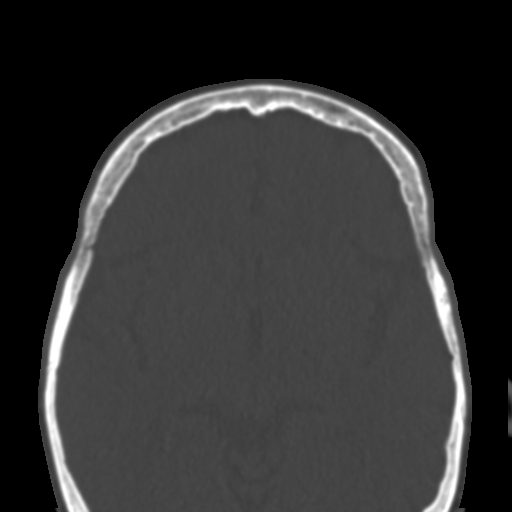

[Series 5: coronal soft · coronal · 0.37mm/px · 3 of 96 slices shown]
[im 32/96  bone]
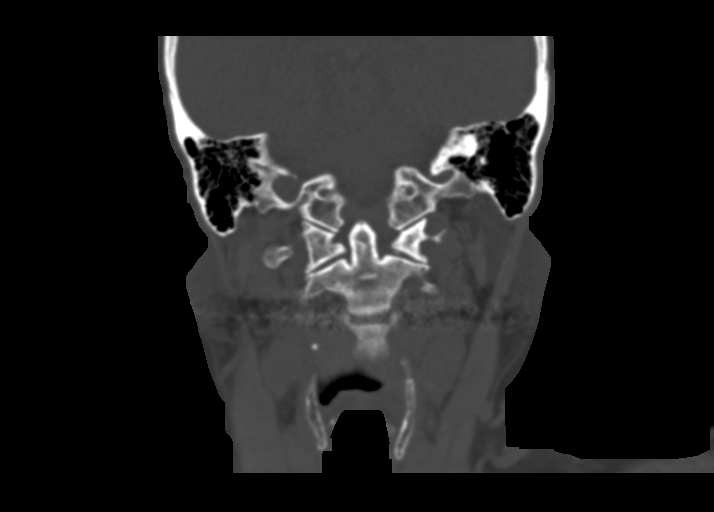
[im 43/96  bone]
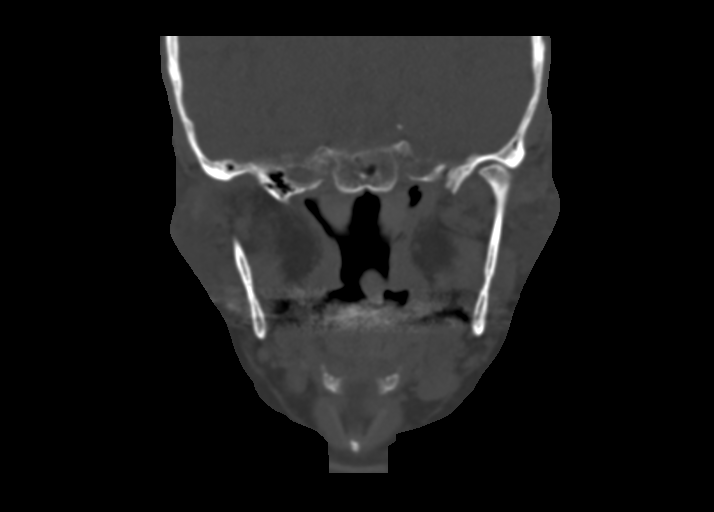
[im 53/96  bone]
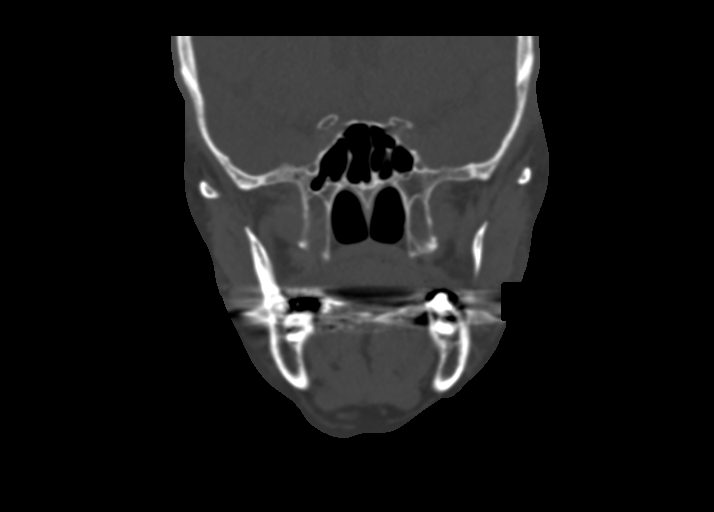

[Series 6: sagittal soft · sagittal · 0.38mm/px · 3 of 86 slices shown]
[im 29/86  bone]
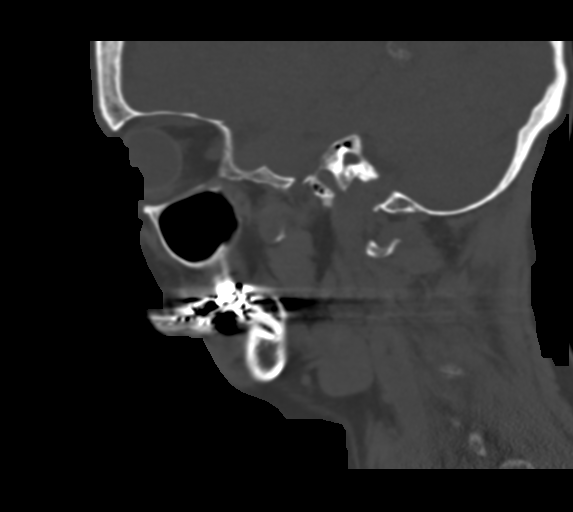
[im 43/86  bone]
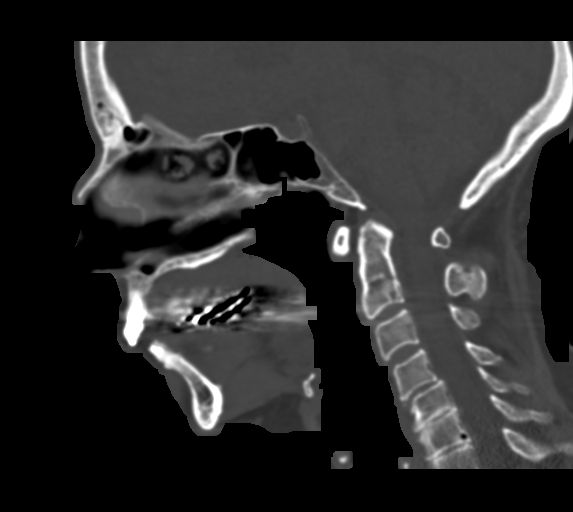
[im 57/86  bone]
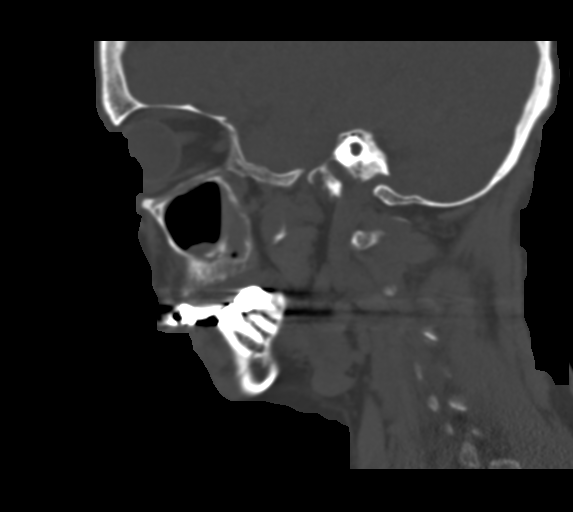

[14 of 47 positions shown; findings below may reference images not displayed]

FINDINGS: CT HEAD FINDINGS

Brain: No acute territorial infarction, hemorrhage or intracranial
mass. Mild atrophy. Nonenlarged ventricles.

Vascular: No hyperdense vessels. Scattered carotid vascular
calcification

Skull: Normal. Negative for fracture or focal lesion.

Other: None

CT MAXILLOFACIAL FINDINGS

Osseous: Mastoid air cells are clear. Mandibular heads are normally
position. No mandibular fracture. Pterygoid plates are intact. No
acute nasal bone fracture. Probable nondisplaced fracture involving
the anterior nasal spine of the maxilla. Acute appearing angular
fracture of the left zygomatic arch.

Orbits: Acute minimally displaced fracture involving the lateral
wall of left orbit. Globes appear intact. No intra or extraconal
soft tissue abnormality.

Sinuses: Small left hemosinus. Acute mildly displaced fracture
involving the left posterolateral wall of the maxillary sinus.

Soft tissues: Mild left facial soft tissue swelling

CT CERVICAL SPINE FINDINGS

Alignment: Straightening of the cervical spine. Trace
anterolisthesis C4 on C5. Mild widening of the atlantal axial
interval, measuring up to 3.6 mm. Slight anterior offset of spinal
laminar line at C1. Facet alignment is maintained.

Skull base and vertebrae: No acute fracture. No primary bone lesion
or focal pathologic process.

Soft tissues and spinal canal: No prevertebral fluid or swelling. No
visible canal hematoma.

Disc levels: Moderate disc space narrowing C4-C5 with advanced disc
space narrowing and degenerative change C5-C6 and C6-C7. facet
degenerative changes at multiple levels.

Upper chest: Negative.

Other: None
IMPRESSION: 1. No CT evidence for acute intracranial abnormality.  Mild atrophy.
2. Straightening of the cervical spine. Mild widening of the
atlantoaxial interval, raises concern for ligamentous injury in the
setting of trauma. Suggest correlation with MRI.
3. Acute appearing fractures involving the left zygomatic arch,
lateral wall of left orbit, posterolateral wall of left maxillary
sinus, and anterior nasal spine of the maxilla.

## 2020-08-30 IMAGING — CT CT CERVICAL SPINE W/O CM
3 of 4 series · 12 of 33 positions shown, 14 images · non-contrast
Comparison: None.

CLINICAL DATA: Head trauma hit left side of face on carpet

EXAM:
CT HEAD WITHOUT CONTRAST
CT MAXILLOFACIAL WITHOUT CONTRAST
CT CERVICAL SPINE WITHOUT CONTRAST
TECHNIQUE: Multidetector CT imaging of the head, cervical spine, and
maxillofacial structures were performed using the standard protocol
without intravenous contrast. Multiplanar CT image reconstructions
of the cervical spine and maxillofacial structures were also
generated.

[Series 6: sagittal bone · sagittal · 0.22mm/px · 5 of 63 slices shown, 6 images]
[im 21/63  bone]
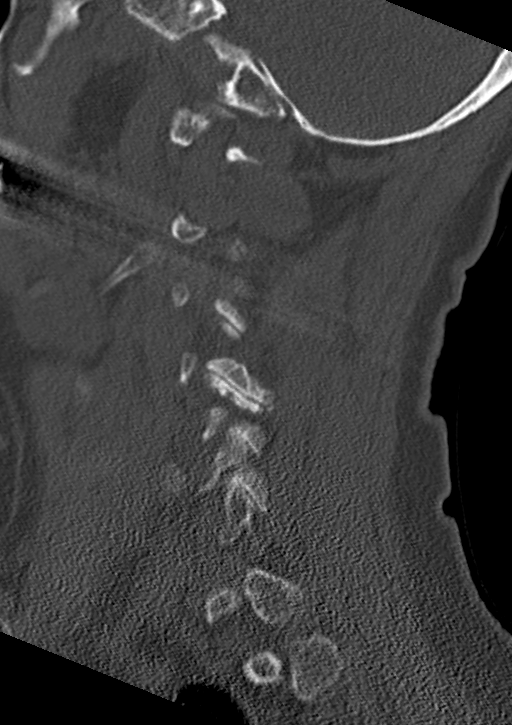
[im 26/63  bone]
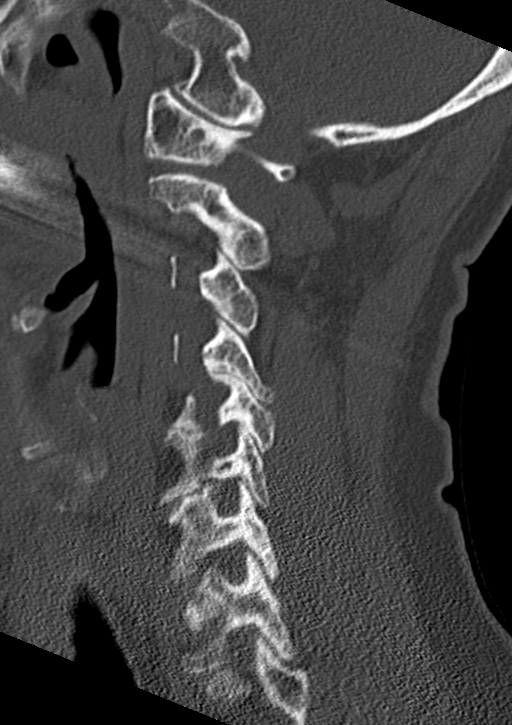
[im 32/63  soft-tissue]
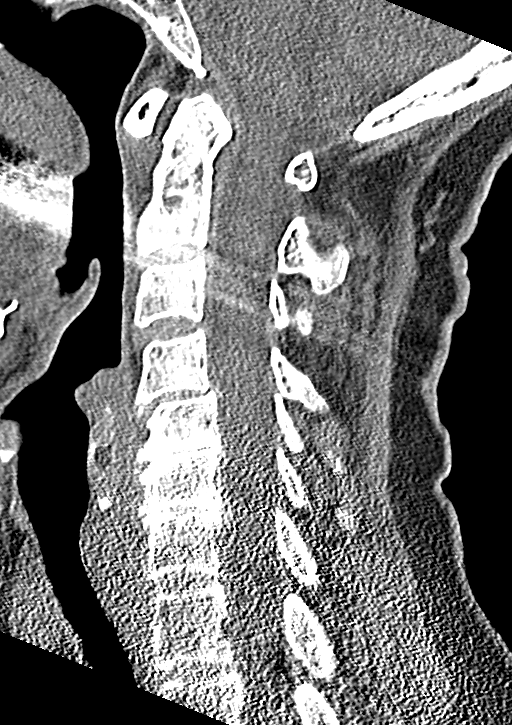
[im 32/63  bone]
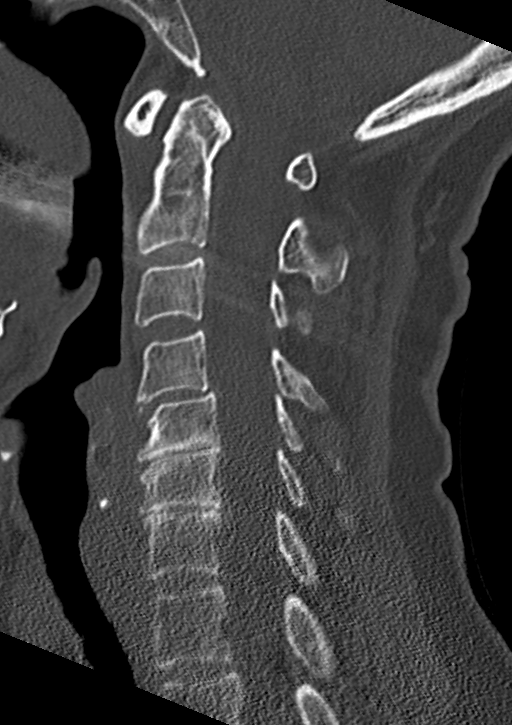
[im 37/63  bone]
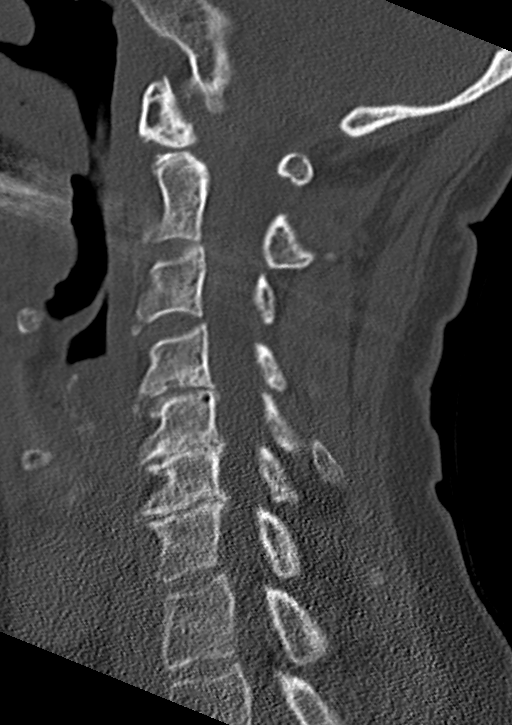
[im 42/63  bone]
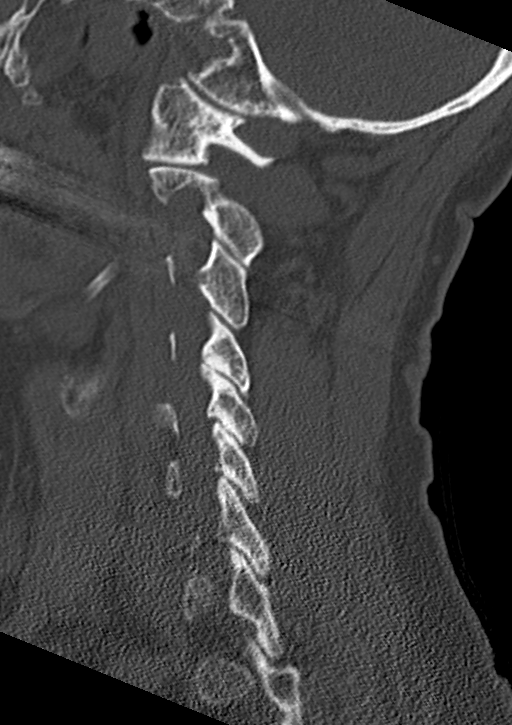

[Series 7: coronal bone · coronal · 0.28mm/px · 3 of 61 slices shown]
[im 14/61  bone]
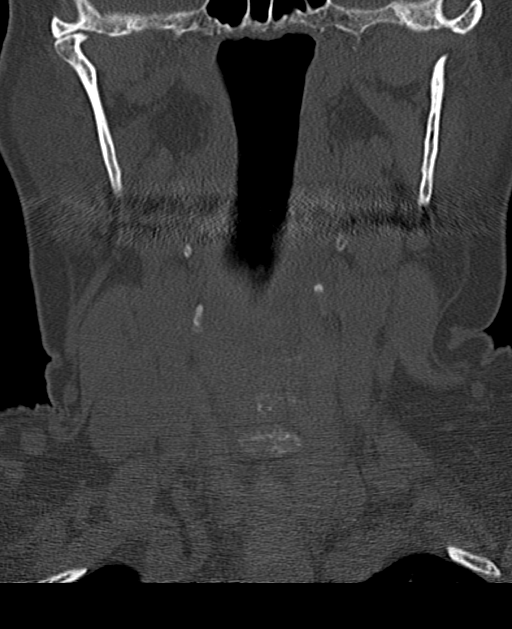
[im 25/61  bone]
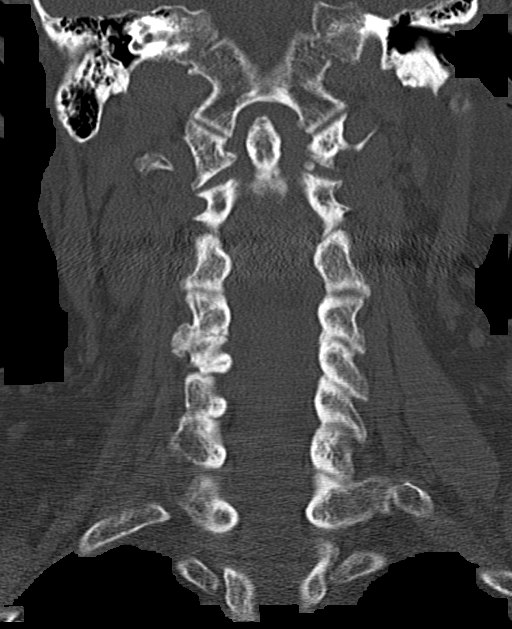
[im 36/61  bone]
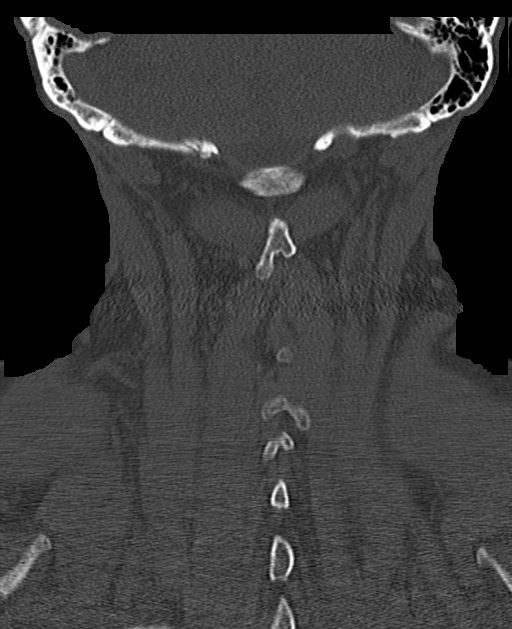

[Series 8: orthogonal axials · axial · 0.23mm/px · z∈[-293,-191]mm · 4 of 87 slices shown, 5 images]
[im 15/87  soft-tissue]
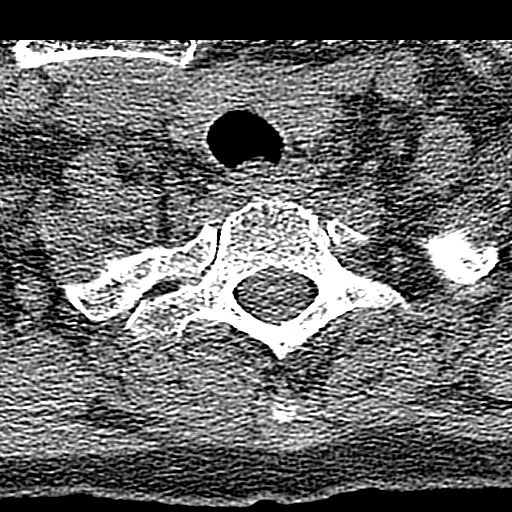
[im 15/87  bone]
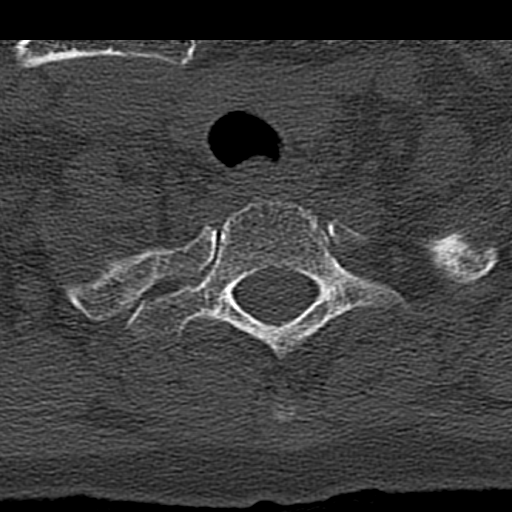
[im 29/87  bone]
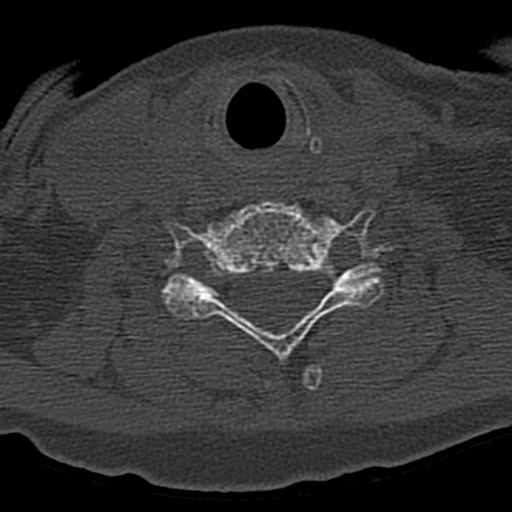
[im 58/87  bone]
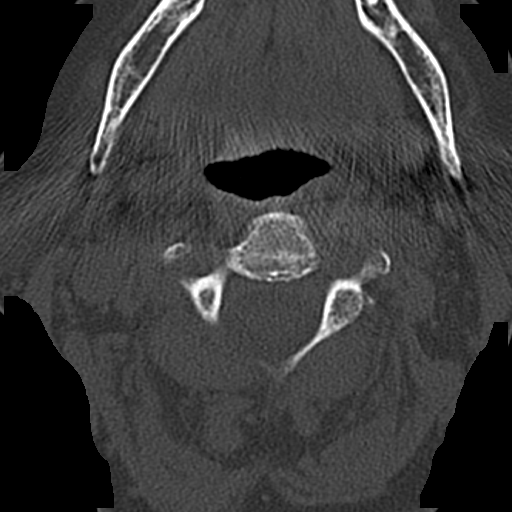
[im 72/87  bone]
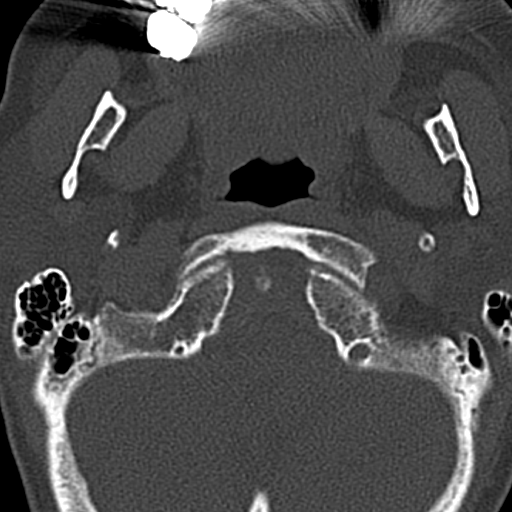

[12 of 33 positions shown; findings below may reference images not displayed]

FINDINGS: CT HEAD FINDINGS

Brain: No acute territorial infarction, hemorrhage or intracranial
mass. Mild atrophy. Nonenlarged ventricles.

Vascular: No hyperdense vessels. Scattered carotid vascular
calcification

Skull: Normal. Negative for fracture or focal lesion.

Other: None

CT MAXILLOFACIAL FINDINGS

Osseous: Mastoid air cells are clear. Mandibular heads are normally
position. No mandibular fracture. Pterygoid plates are intact. No
acute nasal bone fracture. Probable nondisplaced fracture involving
the anterior nasal spine of the maxilla. Acute appearing angular
fracture of the left zygomatic arch.

Orbits: Acute minimally displaced fracture involving the lateral
wall of left orbit. Globes appear intact. No intra or extraconal
soft tissue abnormality.

Sinuses: Small left hemosinus. Acute mildly displaced fracture
involving the left posterolateral wall of the maxillary sinus.

Soft tissues: Mild left facial soft tissue swelling

CT CERVICAL SPINE FINDINGS

Alignment: Straightening of the cervical spine. Trace
anterolisthesis C4 on C5. Mild widening of the atlantal axial
interval, measuring up to 3.6 mm. Slight anterior offset of spinal
laminar line at C1. Facet alignment is maintained.

Skull base and vertebrae: No acute fracture. No primary bone lesion
or focal pathologic process.

Soft tissues and spinal canal: No prevertebral fluid or swelling. No
visible canal hematoma.

Disc levels: Moderate disc space narrowing C4-C5 with advanced disc
space narrowing and degenerative change C5-C6 and C6-C7. facet
degenerative changes at multiple levels.

Upper chest: Negative.

Other: None
IMPRESSION: 1. No CT evidence for acute intracranial abnormality.  Mild atrophy.
2. Straightening of the cervical spine. Mild widening of the
atlantoaxial interval, raises concern for ligamentous injury in the
setting of trauma. Suggest correlation with MRI.
3. Acute appearing fractures involving the left zygomatic arch,
lateral wall of left orbit, posterolateral wall of left maxillary
sinus, and anterior nasal spine of the maxilla.

## 2020-08-30 MED ORDER — CEPHALEXIN 500 MG PO CAPS: 500.0000 mg | ORAL_CAPSULE | Freq: Three times a day (TID) | ORAL | 0 refills | Status: AC

## 2020-08-30 NOTE — ED Notes (Signed)
Dizziness-just started back on the neuropathy medicines. Lyrica and cymbalta, Im pretty that's what made dizzy.

## 2020-08-30 NOTE — Discharge Instructions (Signed)
Follow-up with ENT as needed to monitor healing of your fracture.  Return immediate to the ER if you have worsening pain fevers bleeding or any additional concerns.

## 2020-08-30 NOTE — ED Provider Notes (Signed)
[facility] EMERGENCY DEPARTMENT Provider Note   CSN: [id number] Arrival date & time: 08/30/20  1911     History Chief Complaint  Patient presents with  . Fall    Carolyn Mckinney is a 75 y.o. female.  Patient presents with left-sided facial pain and bruising after fall.  She states that she rolled out of bed earlier this morning and fell onto the ground hit the left side of her face on the ground.  Denies any vomiting.  Has persistent left face pain and headache.  Denies neck pain or back pain.  Denies fevers cough or chest pain.        Past Medical History:  Diagnosis Date  . Allergy   . Cancer ([place])    Breast  . Cataract    cataract repair bilaterally  . Closed patellar sleeve fracture of right knee 05/2019  . Hypertension   . Meniere's disease     Patient Active Problem List   Diagnosis Date Noted  . Facet hypertrophy of lumbar region 06/17/2020  . Lumbar radiculopathy 06/10/2020  . Primary osteoarthritis of left knee 05/12/2020  . Strain of left hamstring 05/12/2020  . Stress fracture of left foot 01/26/2020  . Weakness of both legs 07/01/2019  . Closed fracture of right tibial plateau 06/11/2019  . 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 Acne 06/03/2018  . Insomnia 06/03/2018  . Neuropathic pain 05/02/2018  . Essential hypertension 05/02/2018  . Meniere disease, 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    Past Surgical History:  Procedure Laterality Date  . BREAST BIOPSY Left    2001  . BREAST EXCISIONAL BIOPSY Left   . BREAST LUMPECTOMY    . COLONOSCOPY  2006  . VAGINAL HYSTERECTOMY  1991   Done for bleeding. Benign pathology     OB History    Gravida  3   Para  3   Term  3   Preterm      AB      Living  3     SAB      IAB      Ectopic      Multiple      Live Births  3           Family History  Problem Relation Age of Onset  . Arthritis Mother   . Heart disease Father   . Colon cancer Neg Hx   . Colon polyps Neg Hx   . Esophageal cancer  Neg Hx   . Rectal cancer Neg Hx   . Stomach cancer Neg Hx     Social History   Tobacco Use  . Smoking status: Never Smoker  . Smokeless tobacco: Never Used  Vaping Use  . Vaping Use: Never used  Substance Use Topics  . Alcohol use: Yes    Comment: just wine/glass of wine in the evening  . Drug use: Never    Home Medications Prior to Admission medications   Medication Sig Start Date End Date Taking? Authorizing Provider  cephALEXin (KEFLEX) 500 MG capsule Take 1 capsule (500 mg total) by mouth 3 (three) times daily for 5 days. 08/30/20 09/04/20 Yes Luna Fuse, MD  amLODipine (NORVASC) 5 MG tablet Take 1 tablet (5 mg total) by mouth daily. 08/04/20   Shelda Pal, DO  ASPIRIN PO Take by mouth.    [provider]  Calcium Carb-Cholecalciferol (CALCIUM + D3 PO) Take by mouth.    [provider]  cefadroxil (DURICEF) 500 MG capsule  05/29/20  [provider]  COVID-19 mRNA Vac-TriS, Pfizer, (PFIZER-BIONT COVID-19 VAC-TRIS) SUSP injection Inject into the muscle. 08/11/20   Carlyle Basques, MD  COVID-19 mRNA vaccine, [place], 30 MCG/0.3ML injection INJECT AS DIRECTED 02/09/20 02/08/21  Carlyle Basques, MD  Diclofenac Sodium (PENNSAID) 2 % SOLN Apply 2 pumps BID to the affected area 06/02/20   Rosemarie Ax, MD  DULoxetine (CYMBALTA) 60 MG capsule Take 1 capsule (60 mg total) by mouth daily. 08/26/20   Narda Amber K, DO  DULoxetine (CYMBALTA) 60 MG capsule Take 1 capsule (60 mg total) by mouth daily. 08/26/20   Narda Amber K, DO  Ferrous Sulfate (IRON PO) Take by mouth.    [provider]  losartan (COZAAR) 100 MG tablet Take 1 tablet (100 mg total) by mouth daily. 08/04/20   Shelda Pal, DO  meloxicam (MOBIC) 15 MG tablet Take 1 tablet by mouth daily. 06/29/20   [provider]  Multiple Vitamin (MULTIVITAMIN ADULT PO) Take by mouth.    [provider]  pregabalin (LYRICA) 150 MG capsule Take 1 capsule (150 mg total)  by mouth 2 (two) times daily. 08/27/20   Shelda Pal, DO  traZODone (DESYREL) 50 MG tablet TAKE 1 TO 2 TABLETS BY MOUTH AT BEDTIME AS NEEDED FOR SLEEP 08/12/20   Wendling, Crosby Oyster, DO  valACYclovir (VALTREX) 500 MG tablet TAKE 4 TABLETS BY MOUTH AT ONSET OF BREAKOUT AND  REPEAT IN 12 HOURS. TAKE ONE TABLET BY MOUTH DAILY OTHERWISE 08/12/20   Shelda Pal, DO  VITAMIN K PO Take by mouth.    [provider]    Allergies    Patient has no known allergies.  Review of Systems   Review of Systems  Constitutional: Negative for fever.  HENT: Negative for ear pain.   Eyes: Negative for pain.  Respiratory: Negative for cough.   Cardiovascular: Negative for chest pain.  Gastrointestinal: Negative for abdominal pain.  Genitourinary: Negative for flank pain.  Musculoskeletal: Negative for back pain.  Skin: Negative for rash.  Neurological: Positive for headaches.    Physical Exam Updated Vital Signs BP (!) 141/86 (BP Location: Left Arm)   Pulse 83   Resp 16   SpO2 100%   Physical Exam Constitutional:      General: She is not in acute distress.    Appearance: Normal appearance.  HENT:     Head: Normocephalic.     Comments: Swelling and ecchymosis with tenderness to the left lateral orbital region and left maxillofacial region.  No gross deformity noted.    Nose: Nose normal.  Eyes:     Extraocular Movements: Extraocular movements intact.     Conjunctiva/sclera: Conjunctivae normal.     Pupils: Pupils are equal, round, and reactive to light.  Cardiovascular:     Rate and Rhythm: Normal rate.  Pulmonary:     Effort: Pulmonary effort is normal.  Musculoskeletal:        General: Normal range of motion.     Cervical back: Normal range of motion.  Neurological:     General: No focal deficit present.     Mental Status: She is alert. Mental status is at baseline.     ED Results / Procedures / Treatments   Labs (all labs ordered are listed, but only  abnormal results are displayed) Labs Reviewed - No data to display  EKG None  Radiology CT Head Wo Contrast  Result Date: 08/30/2020 CLINICAL DATA:  Head trauma hit left side of face on  carpet EXAM: CT HEAD WITHOUT CONTRAST CT MAXILLOFACIAL WITHOUT CONTRAST CT CERVICAL SPINE WITHOUT CONTRAST TECHNIQUE: Multidetector CT imaging of the head, cervical spine, and maxillofacial structures were performed using the standard protocol without intravenous contrast. Multiplanar CT image reconstructions of the cervical spine and maxillofacial structures were also generated. COMPARISON:  None. FINDINGS: CT HEAD FINDINGS Brain: No acute territorial infarction, hemorrhage or intracranial mass. Mild atrophy. Nonenlarged ventricles. Vascular: No hyperdense vessels. Scattered carotid vascular calcification Skull: Normal. Negative for fracture or focal lesion. Other: None CT MAXILLOFACIAL FINDINGS Osseous: Mastoid air cells are clear. Mandibular heads are normally position. No mandibular fracture. Pterygoid plates are intact. No acute nasal bone fracture. Probable nondisplaced fracture involving the anterior nasal spine of the maxilla. Acute appearing angular fracture of the left zygomatic arch. Orbits: Acute minimally displaced fracture involving the lateral wall of left orbit. Globes appear intact. No intra or extraconal soft tissue abnormality. Sinuses: Small left hemosinus. Acute mildly displaced fracture involving the left posterolateral wall of the maxillary sinus. Soft tissues: Mild left facial soft tissue swelling CT CERVICAL SPINE FINDINGS Alignment: Straightening of the cervical spine. Trace anterolisthesis C4 on C5. Mild widening of the atlantal axial interval, measuring up to 3.6 mm. Slight anterior offset of spinal laminar line at C1. Facet alignment is maintained. Skull base and vertebrae: No acute fracture. No primary bone lesion or focal pathologic process. Soft tissues and spinal canal: No prevertebral fluid  or swelling. No visible canal hematoma. Disc levels: Moderate disc space narrowing C4-C5 with advanced disc space narrowing and degenerative change C5-C6 and C6-C7. facet degenerative changes at multiple levels. Upper chest: Negative. Other: None IMPRESSION: 1. No CT evidence for acute intracranial abnormality.  Mild atrophy. 2. Straightening of the cervical spine. Mild widening of the atlantoaxial interval, raises concern for ligamentous injury in the setting of trauma. Suggest correlation with MRI. 3. Acute appearing fractures involving the left zygomatic arch, lateral wall of left orbit, posterolateral wall of left maxillary sinus, and anterior nasal spine of the maxilla. Electronically Signed   By: Donavan Foil M.D.   On: 08/30/2020 20:24   CT Cervical Spine Wo Contrast  Result Date: 08/30/2020 CLINICAL DATA:  Head trauma hit left side of face on carpet EXAM: CT HEAD WITHOUT CONTRAST CT MAXILLOFACIAL WITHOUT CONTRAST CT CERVICAL SPINE WITHOUT CONTRAST TECHNIQUE: Multidetector CT imaging of the head, cervical spine, and maxillofacial structures were performed using the standard protocol without intravenous contrast. Multiplanar CT image reconstructions of the cervical spine and maxillofacial structures were also generated. COMPARISON:  None. FINDINGS: CT HEAD FINDINGS Brain: No acute territorial infarction, hemorrhage or intracranial mass. Mild atrophy. Nonenlarged ventricles. Vascular: No hyperdense vessels. Scattered carotid vascular calcification Skull: Normal. Negative for fracture or focal lesion. Other: None CT MAXILLOFACIAL FINDINGS Osseous: Mastoid air cells are clear. Mandibular heads are normally position. No mandibular fracture. Pterygoid plates are intact. No acute nasal bone fracture. Probable nondisplaced fracture involving the anterior nasal spine of the maxilla. Acute appearing angular fracture of the left zygomatic arch. Orbits: Acute minimally displaced fracture involving the lateral wall of  left orbit. Globes appear intact. No intra or extraconal soft tissue abnormality. Sinuses: Small left hemosinus. Acute mildly displaced fracture involving the left posterolateral wall of the maxillary sinus. Soft tissues: Mild left facial soft tissue swelling CT CERVICAL SPINE FINDINGS Alignment: Straightening of the cervical spine. Trace anterolisthesis C4 on C5. Mild widening of the atlantal axial interval, measuring up to 3.6 mm. Slight anterior offset of spinal laminar line at C1. Facet  alignment is maintained. Skull base and vertebrae: No acute fracture. No primary bone lesion or focal pathologic process. Soft tissues and spinal canal: No prevertebral fluid or swelling. No visible canal hematoma. Disc levels: Moderate disc space narrowing C4-C5 with advanced disc space narrowing and degenerative change C5-C6 and C6-C7. facet degenerative changes at multiple levels. Upper chest: Negative. Other: None IMPRESSION: 1. No CT evidence for acute intracranial abnormality.  Mild atrophy. 2. Straightening of the cervical spine. Mild widening of the atlantoaxial interval, raises concern for ligamentous injury in the setting of trauma. Suggest correlation with MRI. 3. Acute appearing fractures involving the left zygomatic arch, lateral wall of left orbit, posterolateral wall of left maxillary sinus, and anterior nasal spine of the maxilla. Electronically Signed   By: Donavan Foil M.D.   On: 08/30/2020 20:24   CT Maxillofacial Wo Contrast  Result Date: 08/30/2020 CLINICAL DATA:  Head trauma hit left side of face on carpet EXAM: CT HEAD WITHOUT CONTRAST CT MAXILLOFACIAL WITHOUT CONTRAST CT CERVICAL SPINE WITHOUT CONTRAST TECHNIQUE: Multidetector CT imaging of the head, cervical spine, and maxillofacial structures were performed using the standard protocol without intravenous contrast. Multiplanar CT image reconstructions of the cervical spine and maxillofacial structures were also generated. COMPARISON:  None. FINDINGS:  CT HEAD FINDINGS Brain: No acute territorial infarction, hemorrhage or intracranial mass. Mild atrophy. Nonenlarged ventricles. Vascular: No hyperdense vessels. Scattered carotid vascular calcification Skull: Normal. Negative for fracture or focal lesion. Other: None CT MAXILLOFACIAL FINDINGS Osseous: Mastoid air cells are clear. Mandibular heads are normally position. No mandibular fracture. Pterygoid plates are intact. No acute nasal bone fracture. Probable nondisplaced fracture involving the anterior nasal spine of the maxilla. Acute appearing angular fracture of the left zygomatic arch. Orbits: Acute minimally displaced fracture involving the lateral wall of left orbit. Globes appear intact. No intra or extraconal soft tissue abnormality. Sinuses: Small left hemosinus. Acute mildly displaced fracture involving the left posterolateral wall of the maxillary sinus. Soft tissues: Mild left facial soft tissue swelling CT CERVICAL SPINE FINDINGS Alignment: Straightening of the cervical spine. Trace anterolisthesis C4 on C5. Mild widening of the atlantal axial interval, measuring up to 3.6 mm. Slight anterior offset of spinal laminar line at C1. Facet alignment is maintained. Skull base and vertebrae: No acute fracture. No primary bone lesion or focal pathologic process. Soft tissues and spinal canal: No prevertebral fluid or swelling. No visible canal hematoma. Disc levels: Moderate disc space narrowing C4-C5 with advanced disc space narrowing and degenerative change C5-C6 and C6-C7. facet degenerative changes at multiple levels. Upper chest: Negative. Other: None IMPRESSION: 1. No CT evidence for acute intracranial abnormality.  Mild atrophy. 2. Straightening of the cervical spine. Mild widening of the atlantoaxial interval, raises concern for ligamentous injury in the setting of trauma. Suggest correlation with MRI. 3. Acute appearing fractures involving the left zygomatic arch, lateral wall of left orbit,  posterolateral wall of left maxillary sinus, and anterior nasal spine of the maxilla. Electronically Signed   By: Donavan Foil M.D.   On: 08/30/2020 20:24    Procedures Procedures   Medications Ordered in ED Medications - No data to display  ED Course  I have reviewed the triage vital signs and the nursing notes.  Pertinent labs & imaging results that were available during my care of the patient were reviewed by me and considered in my medical decision making (see chart for details).    MDM Rules/Calculators/A&P  Extraocular motions are intact bilaterally.  Pupils equal reactive bilaterally 43, no hyphema is noted.  No evidence of entrapment on exam.  CT imaging concerning for left-sided orbital fracture with fracture of the zygomatic arch and sinus.  There was some concern about C-spine abnormalities with no acute fracture noted per radiology.  Patient has no pain with range of motion of the neck.  No C or T or L-spine tenderness or step-offs noted.  Patient advised outpatient follow-up with facial trauma, advised immediate return for worsening pain fevers or any additional concerns.  Final Clinical Impression(s) / ED Diagnoses Final diagnoses:  Closed fracture of orbit, initial encounter [facility])    Rx / DC Orders ED Discharge Orders         Ordered    cephALEXin (KEFLEX) 500 MG capsule  3 times daily        08/30/20 2052           Luna Fuse, MD 08/30/20 2052

## 2020-08-30 NOTE — ED Triage Notes (Addendum)
C/o fall x 1 day ago hitting head on hard floor , bruising noted to left orbital with swelling and pain

## 2020-08-30 NOTE — ED Notes (Addendum)
V/o HOng MD for ct neck , face and head

## 2020-09-01 DIAGNOSIS — M5416 Radiculopathy, lumbar region: Secondary | ICD-10-CM | POA: Diagnosis not present

## 2020-09-02 DIAGNOSIS — G4700 Insomnia, unspecified: Secondary | ICD-10-CM

## 2020-09-03 DIAGNOSIS — M1712 Unilateral primary osteoarthritis, left knee: Secondary | ICD-10-CM | POA: Diagnosis not present

## 2020-09-06 MED ORDER — LOSARTAN POTASSIUM 100 MG PO TABS: 100.0000 mg | ORAL_TABLET | Freq: Every day | ORAL | 1 refills | Status: DC

## 2020-09-06 MED ORDER — AMLODIPINE BESYLATE 5 MG PO TABS: 1.0000 | ORAL_TABLET | Freq: Every day | ORAL | 0 refills | Status: DC

## 2020-09-06 MED ORDER — TRAZODONE HCL 50 MG PO TABS: 50.0000 mg | ORAL_TABLET | Freq: Every evening | ORAL | 0 refills | Status: DC | PRN

## 2020-09-06 MED ORDER — VALACYCLOVIR HCL 500 MG PO TABS: ORAL_TABLET | ORAL | 0 refills | Status: DC

## 2020-09-18 DIAGNOSIS — G4700 Insomnia, unspecified: Secondary | ICD-10-CM

## 2020-09-21 MED ORDER — LOSARTAN POTASSIUM 100 MG PO TABS: 100.0000 mg | ORAL_TABLET | Freq: Every day | ORAL | 0 refills | Status: DC

## 2020-09-21 MED ORDER — TRAZODONE HCL 50 MG PO TABS: 50.0000 mg | ORAL_TABLET | Freq: Every evening | ORAL | 0 refills | Status: DC | PRN

## 2020-09-22 ENCOUNTER — Ambulatory Visit (INDEPENDENT_AMBULATORY_CARE_PROVIDER_SITE_OTHER): Payer: Medicare Other | Admitting: Family Medicine

## 2020-09-22 ENCOUNTER — Other Ambulatory Visit: Payer: Self-pay

## 2020-09-22 ENCOUNTER — Encounter: Payer: Self-pay | Admitting: Family Medicine

## 2020-09-22 VITALS — BP 120/80 | HR 85 | Temp 97.8°F | Ht 61.0 in | Wt 145.5 lb

## 2020-09-22 DIAGNOSIS — S060X0A Concussion without loss of consciousness, initial encounter: Secondary | ICD-10-CM | POA: Diagnosis not present

## 2020-09-22 DIAGNOSIS — S0990XA Unspecified injury of head, initial encounter: Secondary | ICD-10-CM

## 2020-09-22 DIAGNOSIS — S0292XA Unspecified fracture of facial bones, initial encounter for closed fracture: Secondary | ICD-10-CM | POA: Diagnosis not present

## 2020-09-22 DIAGNOSIS — W19XXXA Unspecified fall, initial encounter: Secondary | ICD-10-CM | POA: Diagnosis not present

## 2020-09-22 NOTE — Patient Instructions (Addendum)
Ice/cold pack over area for 10-15 min twice daily.  OK to take Tylenol 1000 mg (2 extra strength tabs) or 975 mg (3 regular strength tabs) every 6 hours as needed.  This should steadily get better over the coming weeks.   Fish oil 3 grams daily for 10 days then 2 grams daily  Vitamin D 4000 IU daily  CoQ10 200mg  daily for headaches  Tart cherry extract any dose at night  To help improve COGNITIVE function: Using fish oil/omega 3 that is 1000 mg (or roughly 600 mg EPA/DHA), starting as soon as possible after concussion, take: 3 tabs THREE TIMES a day  for the first 3 days, then (you will smell a little, sorry) 3 tabs TWICE DAILY  for the next 3 days, then 3 tabs ONCE DAILY  for the next 10 days    To help reduce HEADACHES: Coenzyme Q10 160mg  ONCE DAILY Riboflavin/Vitamin B2 400mg  ONCE DAILY Magnesium oxide 400 mg ONE-TWO TIMES DAILY May stop after headaches are resolved.                                                                                               To help with INSOMNIA: Melatonin 3-5mg  AT BEDTIME Tart cherry extract, any dose at night    Other medicines to help decrease inflammation Alpha Lipoic Acid 100mg  TWICE DAILY Turmeric 500mg  twice daily Iron 65mg  elemental daily Vitamin D 4000 IU daily for 2 weeks then 2000 IU daily thereafter.

## 2020-09-22 NOTE — Progress Notes (Signed)
Chief Complaint  Patient presents with  . check face after a fall/and xray results    Subjective: Patient is a 75 y.o. female here for follow up.  On 5/9, the patient sustained a fall.  She had increased dizziness due to not having her duloxetine.  She hit her face and had lots of bleeding.  CT scan from the ED showed a fractured zygomatic arch, left orbit, left nasal bone, and posterior left maxillary sinus.  She has difficulty concentrating without headaches.  She has some eye pain with movement.  Vision is unchanged.  She has a lot of pain over her cheek area.  She wants to know if this is normal or if anything needs to be done prior to her leaving.  No weakness, numbness, tingling, difficulty swallowing, or trouble with speech.  Past Medical History:  Diagnosis Date  . Allergy   . Cancer ([place])    Breast  . Cataract    cataract repair bilaterally  . Closed patellar sleeve fracture of right knee 05/2019  . Hypertension   . Meniere's disease     Objective: BP 120/80 (BP Location: Left Arm, Patient Position: Sitting, Cuff Size: Normal)   Pulse 85   Temp 97.8 F (36.6 C) (Oral)   Ht 5\' 1"  (1.549 m)   Wt 145 lb 8 oz (66 kg)   SpO2 96%   BMI 27.49 kg/m  General: Awake, appears stated age HEENT: MMM, ecchymosis noted along the left upper gum line without significant edema Neuro:  DTRs equal and symmetric throughout, no clonus, no cerebellar signs MSK: Tenderness to palpation over the left zygomatic region, left orbit, and left maxillary region.  There is no tenderness to palpation. Lungs: No accessory muscle use Psych: Age appropriate judgment and insight, normal affect and mood   Assessment and Plan: Concussion without loss of consciousness, initial encounter  Injury of head, initial encounter  Closed fracture of facial bone due to fall, initial encounter [facility])  The patient clearly has a concussion.  Her headache was exacerbated with concentration during the exam. This  should steadily improve.  She has ecchymosis from the fall that is lingering. OTC supplements/vitamin list provided to help w specific s/s's for concussions. Ice, Tylenol, OK to travel.  F/u as originally scheduled.  The patient voiced understanding and agreement to the plan.  Greater than 30 minutes were spent with the patient in addition to reviewing their chart information on the same day of the visit.   [place], DO 09/22/20  11:48 AM

## 2020-09-27 ENCOUNTER — Ambulatory Visit: Payer: Medicare Other | Attending: Internal Medicine

## 2020-09-27 DIAGNOSIS — Z20822 Contact with and (suspected) exposure to covid-19: Secondary | ICD-10-CM

## 2020-09-28 LAB — SARS-COV-2, NAA 2 DAY TAT

## 2020-09-28 LAB — NOVEL CORONAVIRUS, NAA: SARS-CoV-2, NAA: NOT DETECTED

## 2020-10-14 ENCOUNTER — Telehealth: Payer: Self-pay | Admitting: Family Medicine

## 2020-10-14 NOTE — Telephone Encounter (Signed)
Left message for patient to schedule Annual Wellness Visit.  Please schedule with Health Nurse Advisor Augustine Radar. at [facility].

## 2020-10-27 ENCOUNTER — Telehealth: Payer: Self-pay | Admitting: Family Medicine

## 2020-10-27 MED ORDER — AMLODIPINE BESYLATE 5 MG PO TABS: 5.0000 mg | ORAL_TABLET | Freq: Every day | ORAL | 0 refills | Status: DC

## 2020-10-27 MED ORDER — LOSARTAN POTASSIUM 100 MG PO TABS: 100.0000 mg | ORAL_TABLET | Freq: Every day | ORAL | 0 refills | Status: DC

## 2020-10-27 NOTE — Telephone Encounter (Signed)
Medication: amLODipine (NORVASC) 5 MG tablet [[id number]]   losartan (COZAAR) 100 MG tablet [[id number]]     Has the patient contacted their pharmacy? Yes.   (If no, request that the patient contact the pharmacy for the refill.) (If yes, when and what did the pharmacy advise?) Call your doctor    Preferred Pharmacy (with phone number or street name): [facility], [address]  [place], [place] [address]  Phone:  [phone]  Fax:  [phone]      Agent: Please be advised that RX refills may take up to 3 business days. We ask that you follow-up with your pharmacy.

## 2020-12-28 ENCOUNTER — Other Ambulatory Visit (HOSPITAL_BASED_OUTPATIENT_CLINIC_OR_DEPARTMENT_OTHER): Payer: Self-pay

## 2021-01-04 ENCOUNTER — Encounter: Payer: Self-pay | Admitting: Family Medicine

## 2021-01-04 ENCOUNTER — Other Ambulatory Visit: Payer: Self-pay

## 2021-01-04 ENCOUNTER — Ambulatory Visit (INDEPENDENT_AMBULATORY_CARE_PROVIDER_SITE_OTHER): Payer: Medicare Other | Admitting: Family Medicine

## 2021-01-04 ENCOUNTER — Other Ambulatory Visit (HOSPITAL_BASED_OUTPATIENT_CLINIC_OR_DEPARTMENT_OTHER): Payer: Self-pay | Admitting: Family Medicine

## 2021-01-04 VITALS — BP 128/72 | HR 73 | Temp 97.4°F | Ht 61.0 in | Wt 140.1 lb

## 2021-01-04 DIAGNOSIS — I10 Essential (primary) hypertension: Secondary | ICD-10-CM

## 2021-01-04 DIAGNOSIS — G4700 Insomnia, unspecified: Secondary | ICD-10-CM

## 2021-01-04 DIAGNOSIS — Z1231 Encounter for screening mammogram for malignant neoplasm of breast: Secondary | ICD-10-CM

## 2021-01-04 MED ORDER — LOSARTAN POTASSIUM 100 MG PO TABS: 100.0000 mg | ORAL_TABLET | Freq: Every day | ORAL | 2 refills | Status: DC

## 2021-01-04 NOTE — Progress Notes (Addendum)
Chief Complaint  Patient presents with   Follow-up    6 months    Subjective Carolyn Mckinney is a 75 y.o. female who presents for hypertension follow up. She does not monitor home blood pressures. She is compliant with medications- Norvasc 5 mg/d, losartan 100 mg/d. Patient has these side effects of medication: none She is adhering to a healthy diet overall. Current exercise: walking No Cp or SOB.   Insomnia Has issues with falling asleep. Takes Trazodone 75-100 mg qhs. No AE's. Working well overall.    Past Medical History:  Diagnosis Date   Allergy    Cancer ([place])    Breast   Cataract    cataract repair bilaterally   Closed patellar sleeve fracture of right knee 05/2019   Hypertension    Meniere's disease     Exam BP 128/72   Pulse 73   Temp (!) 97.4 F (36.3 C) (Oral)   Ht '5\' 1"'$  (1.549 m)   Wt 140 lb 2 oz (63.6 kg)   SpO2 95%   BMI 26.48 kg/m  General:  well developed, well nourished, in no apparent distress Heart: RRR, no bruits, no LE edema Lungs: clear to auscultation, no accessory muscle use Psych: well oriented with normal range of affect and appropriate judgment/insight  Essential hypertension  Insomnia, unspecified type  Chronic, stable. Cont Norvasc 5 mg/d, Losartan 100 mg/d. Counseled on diet and exercise. Chronic, stable. Cont trazodone 50-100 mg qhs.  F/u in 6 mo. The patient voiced understanding and agreement to the plan.  [place], DO 01/04/21  2:08 PM

## 2021-01-04 NOTE — Patient Instructions (Addendum)
Keep the diet clean and stay active.  I recommend getting the flu shot in mid October. This suggestion would change if the CDC comes out with a different recommendation.   The new Shingrix vaccine (for shingles) is a 2 shot series. It can make people feel low energy, achy and almost like they have the flu for 48 hours after injection. Please plan accordingly when deciding on when to get this shot. Call your pharmacy for an appointment to get this. The second shot of the series is less severe regarding the side effects, but it still lasts 48 hours.   Let us know if you need anything.

## 2021-01-06 ENCOUNTER — Ambulatory Visit: Payer: Medicare Other | Admitting: Neurology

## 2021-02-07 ENCOUNTER — Ambulatory Visit (HOSPITAL_BASED_OUTPATIENT_CLINIC_OR_DEPARTMENT_OTHER): Payer: Medicare Other

## 2021-02-14 DIAGNOSIS — Z23 Encounter for immunization: Secondary | ICD-10-CM | POA: Diagnosis not present

## 2021-03-01 ENCOUNTER — Other Ambulatory Visit: Payer: Self-pay

## 2021-03-01 ENCOUNTER — Encounter (HOSPITAL_BASED_OUTPATIENT_CLINIC_OR_DEPARTMENT_OTHER): Payer: Self-pay

## 2021-03-01 ENCOUNTER — Ambulatory Visit (HOSPITAL_BASED_OUTPATIENT_CLINIC_OR_DEPARTMENT_OTHER)
Admission: RE | Admit: 2021-03-01 | Discharge: 2021-03-01 | Disposition: A | Discharge status: Home / Self Care | Payer: Medicare Other | Source: Ambulatory Visit | Attending: Family Medicine | Admitting: Family Medicine

## 2021-03-01 DIAGNOSIS — Z1231 Encounter for screening mammogram for malignant neoplasm of breast: Secondary | ICD-10-CM | POA: Diagnosis not present

## 2021-03-01 IMAGING — MG MM DIGITAL SCREENING BILAT W/ TOMO AND CAD
8 series · 8 of 24 positions shown · non-contrast
Comparison: Previous exam(s).

CLINICAL DATA: Screening.

EXAM:
DIGITAL SCREENING BILATERAL MAMMOGRAM WITH TOMOSYNTHESIS AND CAD
TECHNIQUE: Bilateral screening digital craniocaudal and mediolateral oblique
mammograms were obtained. Bilateral screening digital breast
tomosynthesis was performed. The images were evaluated with
computer-aided detection.

[L CC synth-2D]
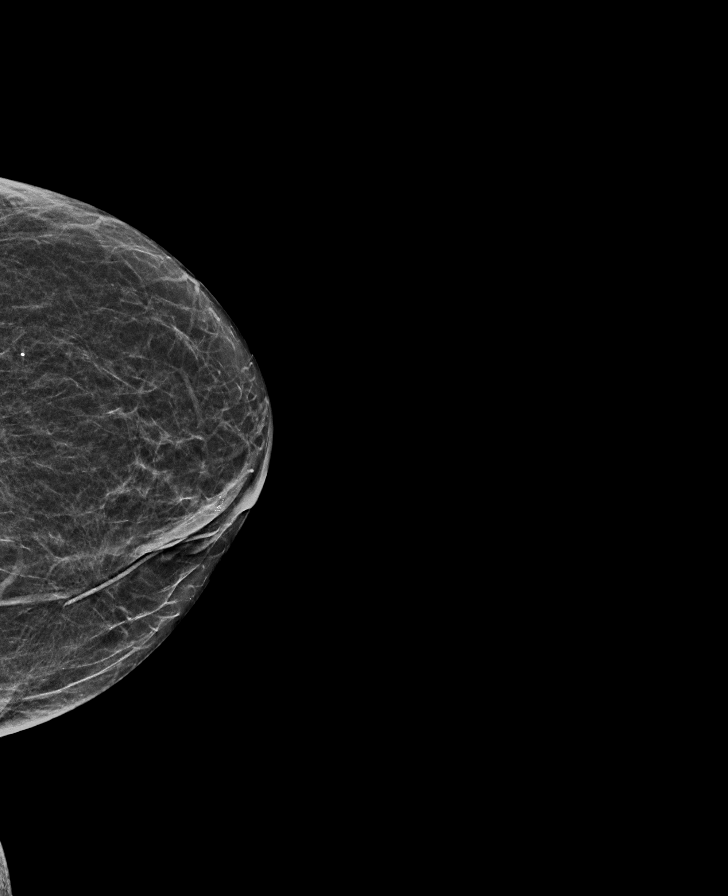

[R MLO synth-2D]
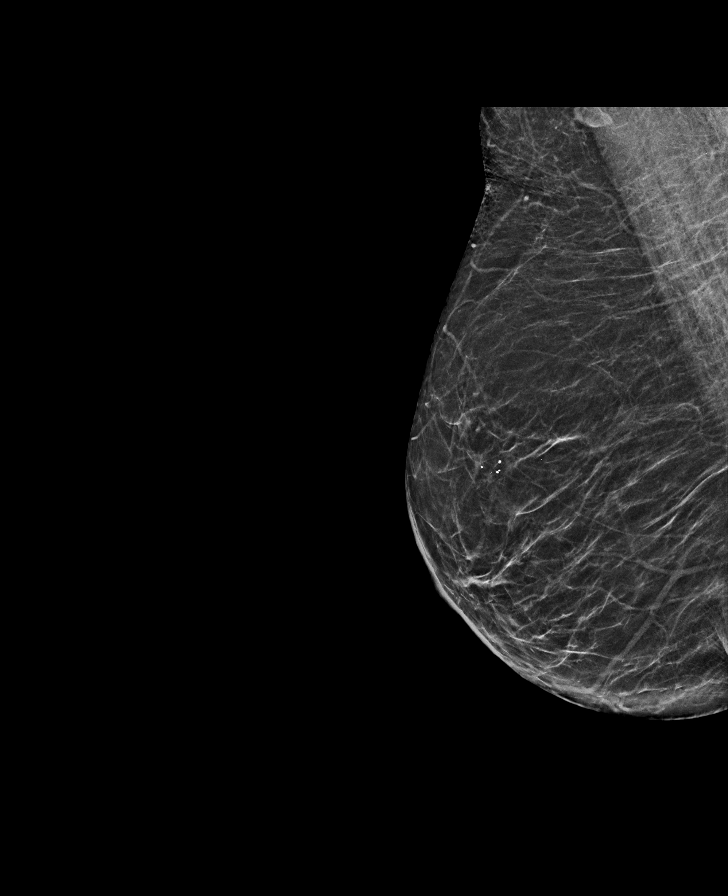

[R CC synth-2D]
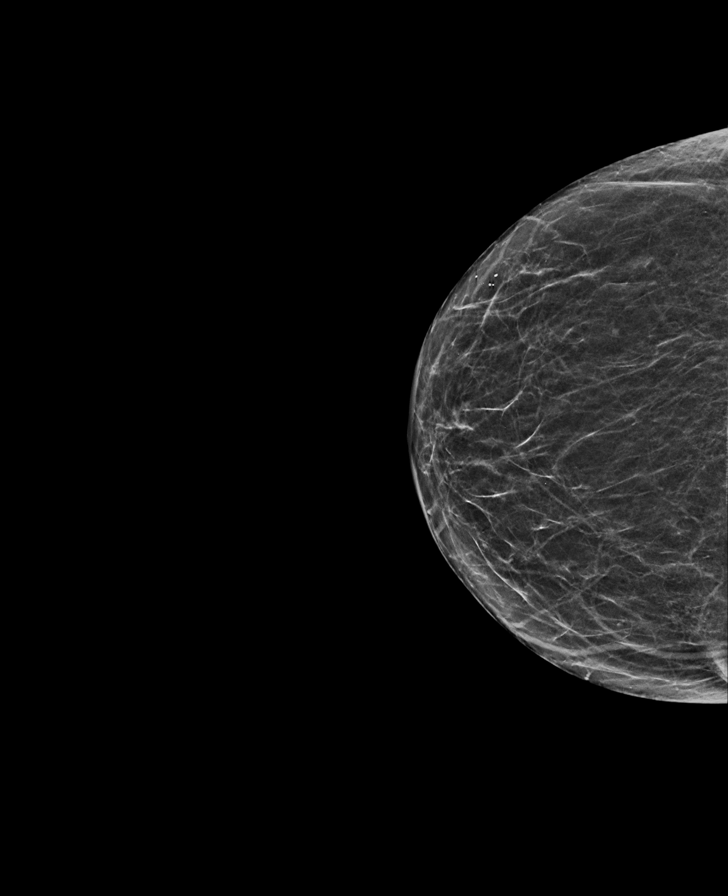

[L MLO synth-2D]
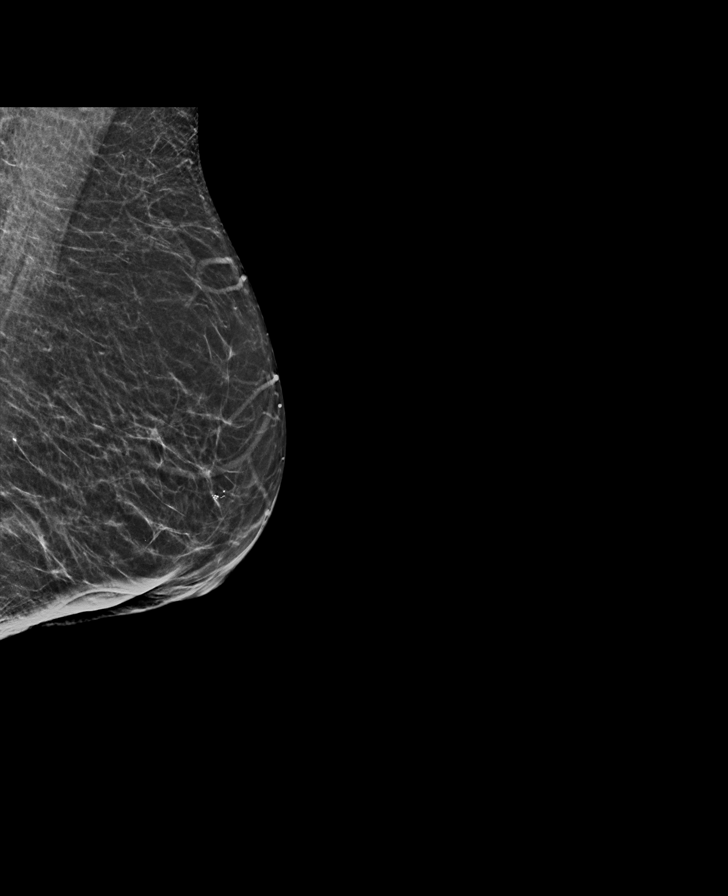

[L MLO tomo · tomo slice 25/49.0]
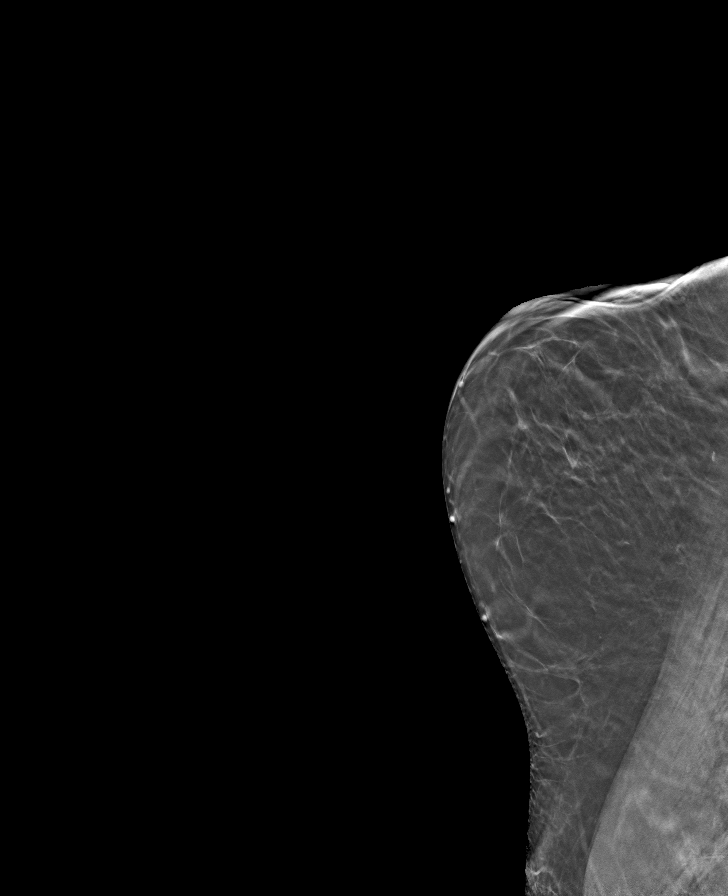

[L CC tomo · tomo slice 23/44.0]
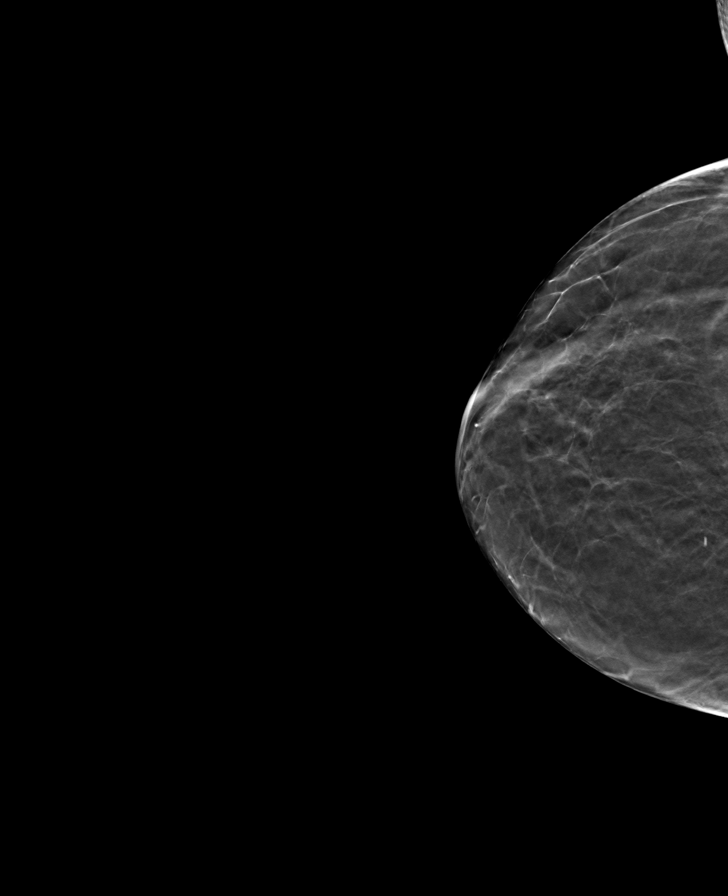

[R MLO tomo · tomo slice 25/50.0]
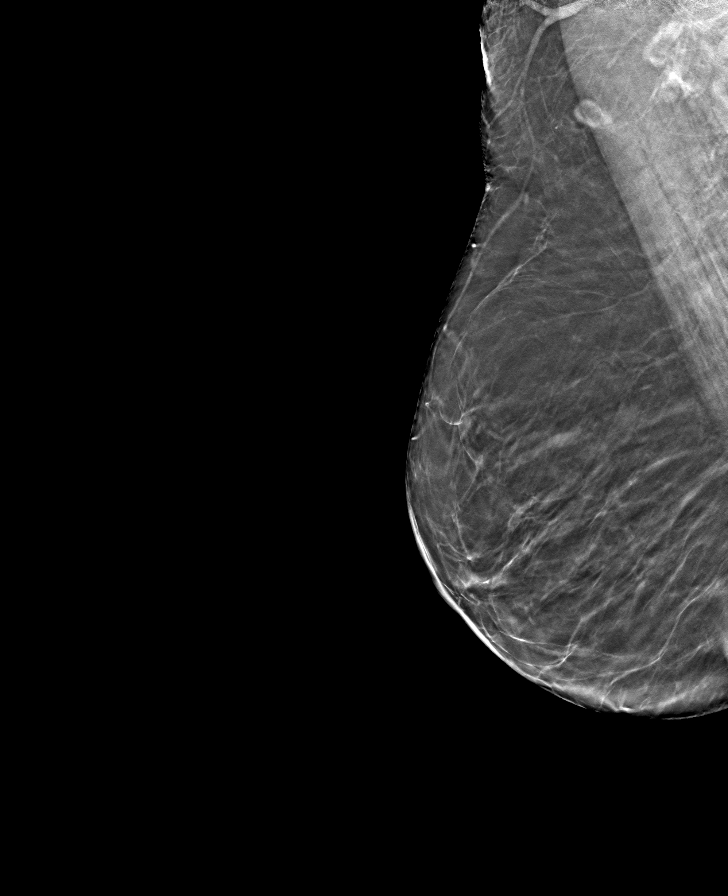

[R CC tomo · tomo slice 24/47.0]
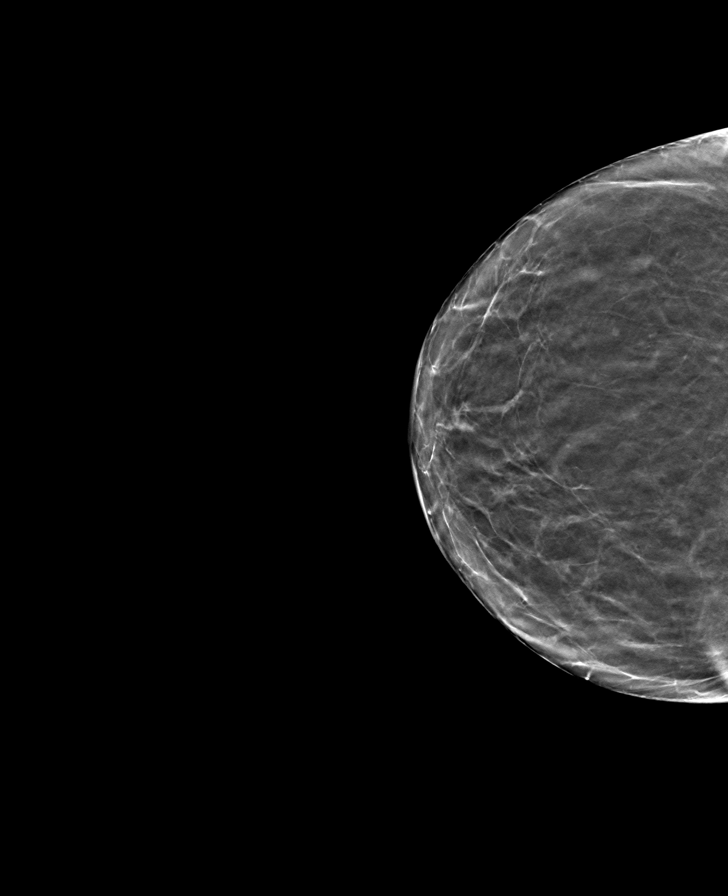

[8 of 24 positions shown; findings below may reference images not displayed]

ACR Breast Density Category b: There are scattered areas of
fibroglandular density.
FINDINGS: There are no findings suspicious for malignancy.
IMPRESSION: No mammographic evidence of malignancy. A result letter of this
screening mammogram will be mailed directly to the patient.

RECOMMENDATION:
Screening mammogram in one year. (Code:[BY])

BI-RADS CATEGORY  1: Negative.

## 2021-03-07 ENCOUNTER — Other Ambulatory Visit: Payer: Self-pay | Admitting: Family Medicine

## 2021-03-10 ENCOUNTER — Ambulatory Visit (INDEPENDENT_AMBULATORY_CARE_PROVIDER_SITE_OTHER): Payer: Medicare Other

## 2021-03-10 VITALS — Ht 61.0 in | Wt 140.0 lb

## 2021-03-10 DIAGNOSIS — Z0000 Encounter for general adult medical examination without abnormal findings: Secondary | ICD-10-CM

## 2021-03-10 NOTE — Patient Instructions (Signed)
Carolyn Mckinney , Thank you for taking time to complete your Medicare Wellness Visit. I appreciate your ongoing commitment to your health goals. Please review the following plan we discussed and let me know if I can assist you in the future.   Screening recommendations/referrals: Colonoscopy: No longer required Mammogram: Completed 03/01/2021-Due 03/01/2022 Bone Density: Completed 10/21/2019-Due 10/20/2021 Recommended yearly ophthalmology/optometry visit for glaucoma screening and checkup Recommended yearly dental visit for hygiene and checkup  Vaccinations: Influenza vaccine: Up to date Pneumococcal vaccine:Please bring documentaion of vaccine to your next appt. Tdap vaccine: Up to date Shingles vaccine: Discuss with pharmacy   Covid-19:Booster available at the pharmacy  Advanced directives: Please bring a copy of Living Will and/or Healthcare Power of Attorney for your chart.   Conditions/risks identified: See problem list  Next appointment: Follow up in one year for your annual wellness visit    Preventive Care 65 Years and Older, Female Preventive care refers to lifestyle choices and visits with your health care provider that can promote health and wellness. What does preventive care include? A yearly physical exam. This is also called an annual well check. Dental exams once or twice a year. Routine eye exams. Ask your health care provider how often you should have your eyes checked. Personal lifestyle choices, including: Daily care of your teeth and gums. Regular physical activity. Eating a healthy diet. Avoiding tobacco and drug use. Limiting alcohol use. Practicing safe sex. Taking low-dose aspirin every day. Taking vitamin and mineral supplements as recommended by your health care provider. What happens during an annual well check? The services and screenings done by your health care provider during your annual well check will depend on your age, overall health, lifestyle risk  factors, and family history of disease. Counseling  Your health care provider may ask you questions about your: Alcohol use. Tobacco use. Drug use. Emotional well-being. Home and relationship well-being. Sexual activity. Eating habits. History of falls. Memory and ability to understand (cognition). Work and work Statistician. Reproductive health. Screening  You may have the following tests or measurements: Height, weight, and BMI. Blood pressure. Lipid and cholesterol levels. These may be checked every 5 years, or more frequently if you are over 45 years old. Skin check. Lung cancer screening. You may have this screening every year starting at age 53 if you have a 30-pack-year history of smoking and currently smoke or have quit within the past 15 years. Fecal occult blood test (FOBT) of the stool. You may have this test every year starting at age 84. Flexible sigmoidoscopy or colonoscopy. You may have a sigmoidoscopy every 5 years or a colonoscopy every 10 years starting at age 77. Hepatitis C blood test. Hepatitis B blood test. Sexually transmitted disease (STD) testing. Diabetes screening. This is done by checking your blood sugar (glucose) after you have not eaten for a while (fasting). You may have this done every 1-3 years. Bone density scan. This is done to screen for osteoporosis. You may have this done starting at age 83. Mammogram. This may be done every 1-2 years. Talk to your health care provider about how often you should have regular mammograms. Talk with your health care provider about your test results, treatment options, and if necessary, the need for more tests. Vaccines  Your health care provider may recommend certain vaccines, such as: Influenza vaccine. This is recommended every year. Tetanus, diphtheria, and acellular pertussis (Tdap, Td) vaccine. You may need a Td booster every 10 years. Zoster vaccine. You may need  this after age 54. Pneumococcal 13-valent  conjugate (PCV13) vaccine. One dose is recommended after age 48. Pneumococcal polysaccharide (PPSV23) vaccine. One dose is recommended after age 54. Talk to your health care provider about which screenings and vaccines you need and how often you need them. This information is not intended to replace advice given to you by your health care provider. Make sure you discuss any questions you have with your health care provider. Document Released: 05/07/2015 Document Revised: 12/29/2015 Document Reviewed: 02/09/2015 Elsevier Interactive Patient Education  2017 [employer] Prevention in the Home Falls can cause injuries. They can happen to people of all ages. There are many things you can do to make your home safe and to help prevent falls. What can I do on the outside of my home? Regularly fix the edges of walkways and driveways and fix any cracks. Remove anything that might make you trip as you walk through a door, such as a raised step or threshold. Trim any bushes or trees on the path to your home. Use bright outdoor lighting. Clear any walking paths of anything that might make someone trip, such as rocks or tools. Regularly check to see if handrails are loose or broken. Make sure that both sides of any steps have handrails. Any raised decks and porches should have guardrails on the edges. Have any leaves, snow, or ice cleared regularly. Use sand or salt on walking paths during winter. Clean up any spills in your garage right away. This includes oil or grease spills. What can I do in the bathroom? Use night lights. Install grab bars by the toilet and in the tub and shower. Do not use towel bars as grab bars. Use non-skid mats or decals in the tub or shower. If you need to sit down in the shower, use a plastic, non-slip stool. Keep the floor dry. Clean up any water that spills on the floor as soon as it happens. Remove soap buildup in the tub or shower regularly. Attach bath mats  securely with double-sided non-slip rug tape. Do not have throw rugs and other things on the floor that can make you trip. What can I do in the bedroom? Use night lights. Make sure that you have a light by your bed that is easy to reach. Do not use any sheets or blankets that are too big for your bed. They should not hang down onto the floor. Have a firm chair that has side arms. You can use this for support while you get dressed. Do not have throw rugs and other things on the floor that can make you trip. What can I do in the kitchen? Clean up any spills right away. Avoid walking on wet floors. Keep items that you use a lot in easy-to-reach places. If you need to reach something above you, use a strong step stool that has a grab bar. Keep electrical cords out of the way. Do not use floor polish or wax that makes floors slippery. If you must use wax, use non-skid floor wax. Do not have throw rugs and other things on the floor that can make you trip. What can I do with my stairs? Do not leave any items on the stairs. Make sure that there are handrails on both sides of the stairs and use them. Fix handrails that are broken or loose. Make sure that handrails are as long as the stairways. Check any carpeting to make sure that it is firmly attached to  the stairs. Fix any carpet that is loose or worn. Avoid having throw rugs at the top or bottom of the stairs. If you do have throw rugs, attach them to the floor with carpet tape. Make sure that you have a light switch at the top of the stairs and the bottom of the stairs. If you do not have them, ask someone to add them for you. What else can I do to help prevent falls? Wear shoes that: Do not have high heels. Have rubber bottoms. Are comfortable and fit you well. Are closed at the toe. Do not wear sandals. If you use a stepladder: Make sure that it is fully opened. Do not climb a closed stepladder. Make sure that both sides of the stepladder  are locked into place. Ask someone to hold it for you, if possible. Clearly mark and make sure that you can see: Any grab bars or handrails. First and last steps. Where the edge of each step is. Use tools that help you move around (mobility aids) if they are needed. These include: Canes. Walkers. Scooters. Crutches. Turn on the lights when you go into a dark area. Replace any light bulbs as soon as they burn out. Set up your furniture so you have a clear path. Avoid moving your furniture around. If any of your floors are uneven, fix them. If there are any pets around you, be aware of where they are. Review your medicines with your doctor. Some medicines can make you feel dizzy. This can increase your chance of falling. Ask your doctor what other things that you can do to help prevent falls. This information is not intended to replace advice given to you by your health care provider. Make sure you discuss any questions you have with your health care provider. Document Released: 02/04/2009 Document Revised: 09/16/2015 Document Reviewed: 05/15/2014 Elsevier Interactive Patient Education  2017 [employer].

## 2021-03-10 NOTE — Progress Notes (Signed)
Subjective:   Carolyn Mckinney is a 75 y.o. female who presents for Medicare Annual (Subsequent) preventive examination.  I connected with Carolyn Mckinney today by telephone and verified that I am speaking with the correct person using two identifiers. Location patient: home Location provider: work Persons participating in the virtual visit: patient, Marine scientist.    I discussed the limitations, risks, security and privacy concerns of performing an evaluation and management service by telephone and the availability of in person appointments. I also discussed with the patient that there may be a patient responsible charge related to this service. The patient expressed understanding and verbally consented to this telephonic visit.    Interactive audio and video telecommunications were attempted between this provider and patient, however failed, due to patient having technical difficulties OR patient did not have access to video capability.  We continued and completed visit with audio only.  Some vital signs may be absent or patient reported.   Time Spent with patient on telephone encounter: 25 minutes   Review of Systems     Cardiac Risk Factors include: advanced age (>1men, >27 women);hypertension     Objective:    Today's Vitals   03/10/21 1301  Weight: 140 lb (63.5 kg)  Height: 5\' 1"  (1.549 m)   Body mass index is 26.45 kg/m.  Advanced Directives 03/10/2021 08/30/2020 01/23/2020 10/15/2019 07/15/2019  Does Patient Have a Medical Advance Directive? Yes Yes Yes Yes Yes  Type of Paramedic of [place];Living will [place];Living will [place];Out of facility DNR (pink MOST or yellow form);Living will [place];Living will -  Does patient want to make changes to medical advance directive? - No - Patient declined - No - Patient declined No - Patient declined  Copy of [place] in Chart? No - copy  requested No - copy requested - No - copy requested -    Current Medications (verified) Outpatient Encounter Medications as of 03/10/2021  Medication Sig   amLODipine (NORVASC) 5 MG tablet Take 1 tablet (5 mg total) by mouth daily.   Calcium Carb-Cholecalciferol (CALCIUM + D3 PO) Take by mouth.   Diclofenac Sodium (PENNSAID) 2 % SOLN Apply 2 pumps BID to the affected area   DULoxetine (CYMBALTA) 60 MG capsule Take 1 capsule (60 mg total) by mouth daily.   Ferrous Sulfate (IRON PO) Take by mouth.   losartan (COZAAR) 100 MG tablet Take 1 tablet (100 mg total) by mouth daily.   Multiple Vitamin (MULTIVITAMIN ADULT PO) Take by mouth.   pregabalin (LYRICA) 150 MG capsule Take 1 capsule (150 mg total) by mouth 2 (two) times daily.   traZODone (DESYREL) 50 MG tablet Take 1-2 tablets (50-100 mg total) by mouth at bedtime as needed.   valACYclovir (VALTREX) 500 MG tablet TAKE 4 TABLETS BY MOUTH AT ONSET OF BREAKOUT AND REPEAT IN 12 HOURS. TAKE ONE TABLET BY MOUTH DAILY OTHERWISE   VITAMIN K PO Take by mouth.   COVID-19 mRNA Vac-TriS, Pfizer, (PFIZER-BIONT COVID-19 VAC-TRIS) SUSP injection Inject into the muscle. (Patient not taking: Reported on 03/10/2021)   No facility-administered encounter medications on file as of 03/10/2021.    Allergies (verified) Patient has no known allergies.   History: Past Medical History:  Diagnosis Date   Allergy    Cancer ([place])    Breast   Cataract    cataract repair bilaterally   Closed patellar sleeve fracture of right knee 05/2019   Hypertension  Meniere's disease    Personal history of radiation therapy 2001   Left Breast Cancer   Past Surgical History:  Procedure Laterality Date   BREAST BIOPSY Left 2001   BREAST LUMPECTOMY Left 01/25/2000   COLONOSCOPY  2006   VAGINAL HYSTERECTOMY  1991   Done for bleeding. Benign pathology   Family History  Problem Relation Age of Onset   Arthritis Mother    Heart disease Father    Colon cancer Neg Hx     Colon polyps Neg Hx    Esophageal cancer Neg Hx    Rectal cancer Neg Hx    Stomach cancer Neg Hx    Social History   Socioeconomic History   Marital status: Widowed    Spouse name: Not on file   Number of children: Not on file   Years of education: Not on file   Highest education level: Not on file  Occupational History   Not on file  Tobacco Use   Smoking status: Never   Smokeless tobacco: Never  Vaping Use   Vaping Use: Never used  Substance and Sexual Activity   Alcohol use: Yes    Comment: just wine/glass of wine in the evening   Drug use: Never   Sexual activity: Not Currently  Other Topics Concern   Not on file  Social History Narrative   Left Handed   One Story Home   Drinks Caffeine    Social Determinants of Health   Financial Resource Strain: Low Risk    Difficulty of Paying Living Expenses: Not hard at all  Food Insecurity: Not on file  Transportation Needs: No Transportation Needs   Lack of Transportation (Medical): No   Lack of Transportation (Non-Medical): No  Physical Activity: Sufficiently Active   Days of Exercise per Week: 3 days   Minutes of Exercise per Session: 60 min  Stress: No Stress Concern Present   Feeling of Stress : Not at all  Social Connections: Moderately Integrated   Frequency of Communication with Friends and Family: More than three times a week   Frequency of Social Gatherings with Friends and Family: More than three times a week   Attends Religious Services: More than 4 times per year   Active Member of [employer] or Organizations: Yes   Attends Archivist Meetings: More than 4 times per year   Marital Status: Widowed    Tobacco Counseling Counseling given: Not Answered   Clinical Intake:  Pre-visit preparation completed: Yes  Pain : No/denies pain     BMI - recorded: 26.45 Nutritional Status: BMI 25 -29 Overweight Nutritional Risks: None Diabetes: No  How often do you need to have someone help you when  you read instructions, pamphlets, or other written materials from your doctor or pharmacy?: 1 - Never  Diabetic?Np  Interpreter Needed?: No  Information entered by :: Caroleen Hamman LPN   Activities of Daily Living In your present state of health, do you have any difficulty performing the following activities: 03/10/2021  Hearing? N  Vision? N  Difficulty concentrating or making decisions? N  Walking or climbing stairs? N  Dressing or bathing? N  Doing errands, shopping? N  Preparing Food and eating ? N  Using the Toilet? N  In the past six months, have you accidently leaked urine? N  Do you have problems with loss of bowel control? N  Managing your Medications? N  Managing your Finances? N  Housekeeping or managing your Housekeeping? N  Some recent  data might be hidden    Patient Care Team: Shelda Pal, DO as PCP - General (Family Medicine) Alda Berthold, DO as Consulting Physician (Neurology)  Indicate any recent Medical Services you may have received from other than [facility] providers in the past year (date may be approximate).     Assessment:   This is a routine wellness examination for Carolyn Mckinney.  Hearing/Vision screen Hearing Screening - Comments:: No issues Vision Screening - Comments:: Last eye exam-Spring 2022  Dietary issues and exercise activities discussed: Current Exercise Habits: Home exercise routine, Type of exercise: calisthenics;strength training/weights, Time (Minutes): 60, Frequency (Times/Week): 3, Weekly Exercise (Minutes/Week): 180, Intensity: Mild, Exercise limited by: orthopedic condition(s)   Goals Addressed             This Visit's Progress    DIET - INCREASE WATER INTAKE   On track    Patient Stated       Maintain current health & independence       Depression Screen PHQ 2/9 Scores 03/10/2021 09/22/2020 10/15/2019  PHQ - 2 Score 0 0 0    Fall Risk Fall Risk  03/10/2021 09/22/2020 01/23/2020 10/15/2019 04/04/2019  Falls in  the past year? 1 0 1 1 0  Comment - - - dog knocked her over. -  Number falls in past yr: 0 0 0 0 0  Injury with Fall? 1 0 1 1 0  Risk for fall due to : History of fall(s) No Fall Risks - - -  Follow up Falls prevention discussed Falls evaluation completed - Education provided;Falls prevention discussed -    FALL RISK PREVENTION PERTAINING TO THE HOME:  Any stairs in or around the home? No  Home free of loose throw rugs in walkways, pet beds, electrical cords, etc? Yes  Adequate lighting in your home to reduce risk of falls? Yes   ASSISTIVE DEVICES UTILIZED TO PREVENT FALLS:  Life alert? No  Use of a cane, walker or w/c? No  Grab bars in the bathroom? No  Shower chair or bench in shower? Yes  Elevated toilet seat or a handicapped toilet? No   TIMED UP AND GO:  Was the test performed? No . Phone visit   Cognitive Function:Normal cognitive status assessed by this Nurse Health Advisor. No abnormalities found.          Immunizations Immunization History  Administered Date(s) Administered   Fluad Quad(high Dose 65+) 02/06/2020   Influenza,inj,Quad PF,6+ Mos 12/19/2018   PFIZER Comirnaty(Gray Top)Covid-19 Tri-Sucrose Vaccine 08/11/2020   PFIZER(Purple Top)SARS-COV-2 Vaccination 06/22/2019, 07/22/2019, 02/09/2020   Pneumococcal Polysaccharide-23 05/02/2018   Tdap 05/02/2018    TDAP status: Up to date  Flu Vaccine status: Up to date  Pneumococcal vaccine status: Up to date-Per patient-Specific date unknown  Covid-19 vaccine status: Information provided on how to obtain vaccines.   Qualifies for Shingles Vaccine? Yes   Zostavax completed No   Shingrix Completed?: No.    Education has been provided regarding the importance of this vaccine. Patient has been advised to call insurance company to determine out of pocket expense if they have not yet received this vaccine. Advised may also receive vaccine at local pharmacy or Health Dept. Verbalized acceptance and  understanding.  Screening Tests Health Maintenance  Topic Date Due   Zoster Vaccines- Shingrix (1 of 2) Never done   Pneumonia Vaccine 40+ Years old (2 - PCV) 05/03/2019   COVID-19 Vaccine (5 - Booster for Pfizer series) 10/06/2020   INFLUENZA VACCINE  11/22/2020  TETANUS/TDAP  05/02/2028   DEXA SCAN  Completed   Hepatitis C Screening  Completed   HPV VACCINES  Aged Out   COLONOSCOPY (Pts 45-5yrs Insurance coverage will need to be confirmed)  Discontinued    Health Maintenance  Health Maintenance Due  Topic Date Due   Zoster Vaccines- Shingrix (1 of 2) Never done   Pneumonia Vaccine 85+ Years old (2 - PCV) 05/03/2019   COVID-19 Vaccine (5 - Booster for Pfizer series) 10/06/2020   INFLUENZA VACCINE  11/22/2020    Colorectal cancer screening: No longer required.   Mammogram status: Completed bilateral 03/01/2021. Repeat every year  Bone Density status: Completed 10/21/2019. Results reflect: Bone density results: OSTEOPENIA. Repeat every 2 years.  Lung Cancer Screening: (Low Dose CT Chest recommended if Age 68-80 years, 30 pack-year currently smoking OR have quit w/in 15years.) does not qualify.    Additional Screening:  Hepatitis C Screening: does qualify; Discuss with PCP  Vision Screening: Recommended annual ophthalmology exams for early detection of glaucoma and other disorders of the eye. Is the patient up to date with their annual eye exam?  Yes  Who is the provider or what is the name of the office in which the patient attends annual eye exams? Dr Odis Hollingshead   Dental Screening: Recommended annual dental exams for proper oral hygiene.   Community Resource Referral / Chronic Care Management: CRR required this visit?  No   CCM required this visit?  No      Plan:     I have personally reviewed and noted the following in the patient's chart:   Medical and social history Use of alcohol, tobacco or illicit drugs  Current medications and supplements including  opioid prescriptions.  Functional ability and status Nutritional status Physical activity Advanced directives List of other physicians Hospitalizations, surgeries, and ER visits in previous 12 months Vitals Screenings to include cognitive, depression, and falls Referrals and appointments  In addition, I have reviewed and discussed with patient certain preventive protocols, quality metrics, and best practice recommendations. A written personalized care plan for preventive services as well as general preventive health recommendations were provided to patient.   Due to this being a telephonic visit, the after visit summary with patients personalized plan was offered to patient via mail or my-chart.  Patient would like to access on my-chart.   Marta Antu, LPN   [id number]  Nurse Health Advisor  Nurse Notes: None

## 2021-03-28 ENCOUNTER — Other Ambulatory Visit: Payer: Self-pay | Admitting: Family Medicine

## 2021-03-28 DIAGNOSIS — M792 Neuralgia and neuritis, unspecified: Secondary | ICD-10-CM

## 2021-04-04 ENCOUNTER — Encounter: Payer: Self-pay | Admitting: Family Medicine

## 2021-04-17 ENCOUNTER — Other Ambulatory Visit: Payer: Self-pay | Admitting: Family Medicine

## 2021-04-28 ENCOUNTER — Other Ambulatory Visit: Payer: Self-pay | Admitting: Family Medicine

## 2021-04-28 DIAGNOSIS — G4700 Insomnia, unspecified: Secondary | ICD-10-CM

## 2021-04-29 NOTE — Telephone Encounter (Signed)
Last Refill for Trazodone #30 no refills on 09/21/2020 Last Office Visit----01/04/2021

## 2021-05-03 ENCOUNTER — Other Ambulatory Visit (HOSPITAL_BASED_OUTPATIENT_CLINIC_OR_DEPARTMENT_OTHER): Payer: Self-pay

## 2021-05-03 ENCOUNTER — Ambulatory Visit: Payer: Medicare Other | Attending: Internal Medicine

## 2021-05-03 DIAGNOSIS — Z23 Encounter for immunization: Secondary | ICD-10-CM

## 2021-05-03 MED ORDER — SHINGRIX 50 MCG/0.5ML IM SUSR
INTRAMUSCULAR | 1 refills | Status: DC
  Filled 2021-05-03: qty 0.5, 1d supply, fill #0
  Filled 2021-08-26: qty 0.5, 1d supply, fill #1

## 2021-05-03 NOTE — Progress Notes (Signed)
° °  Covid-19 Vaccination Clinic  Name:  Carolyn Mckinney    MRN: [id number] DOB: 08-Aug-1945  05/03/2021  [name] was observed post Covid-19 immunization for 15 minutes without incident. She was provided with Vaccine Information Sheet and instruction to access the V-Safe system.   [name] was instructed to call 911 with any severe reactions post vaccine: Difficulty breathing  Swelling of face and throat  A fast heartbeat  A bad rash all over body  Dizziness and weakness   Immunizations Administered     Name Date Dose VIS Date Route   Pfizer Covid-19 Vaccine Bivalent Booster 05/03/2021 11:29 AM 0.3 mL 12/22/2020 Intramuscular   Manufacturer: [place]   Lot: [id number]   [place]: [phone]

## 2021-05-05 ENCOUNTER — Other Ambulatory Visit (HOSPITAL_BASED_OUTPATIENT_CLINIC_OR_DEPARTMENT_OTHER): Payer: Self-pay

## 2021-05-05 DIAGNOSIS — Z23 Encounter for immunization: Secondary | ICD-10-CM | POA: Diagnosis not present

## 2021-05-05 MED ORDER — PFIZER COVID-19 VAC BIVALENT 30 MCG/0.3ML IM SUSP
INTRAMUSCULAR | 0 refills | Status: DC
  Filled 2021-05-05: qty 0.3, 1d supply, fill #0

## 2021-05-09 ENCOUNTER — Other Ambulatory Visit: Payer: Self-pay | Admitting: Family Medicine

## 2021-05-24 ENCOUNTER — Other Ambulatory Visit: Payer: Self-pay | Admitting: Family Medicine

## 2021-06-15 DIAGNOSIS — L700 Acne vulgaris: Secondary | ICD-10-CM | POA: Diagnosis not present

## 2021-06-15 DIAGNOSIS — Z79899 Other long term (current) drug therapy: Secondary | ICD-10-CM | POA: Diagnosis not present

## 2021-06-16 DIAGNOSIS — L700 Acne vulgaris: Secondary | ICD-10-CM | POA: Diagnosis not present

## 2021-06-16 DIAGNOSIS — Z79899 Other long term (current) drug therapy: Secondary | ICD-10-CM | POA: Diagnosis not present

## 2021-06-21 ENCOUNTER — Other Ambulatory Visit: Payer: Self-pay | Admitting: Family Medicine

## 2021-06-21 DIAGNOSIS — M792 Neuralgia and neuritis, unspecified: Secondary | ICD-10-CM

## 2021-06-23 ENCOUNTER — Other Ambulatory Visit: Payer: Self-pay | Admitting: Family Medicine

## 2021-06-23 DIAGNOSIS — G4700 Insomnia, unspecified: Secondary | ICD-10-CM

## 2021-06-23 DIAGNOSIS — M792 Neuralgia and neuritis, unspecified: Secondary | ICD-10-CM

## 2021-06-27 ENCOUNTER — Ambulatory Visit: Payer: Self-pay

## 2021-06-27 ENCOUNTER — Ambulatory Visit (INDEPENDENT_AMBULATORY_CARE_PROVIDER_SITE_OTHER): Payer: Medicare Other | Admitting: Family Medicine

## 2021-06-27 VITALS — BP 118/64 | Ht 61.0 in | Wt 140.0 lb

## 2021-06-27 DIAGNOSIS — G8929 Other chronic pain: Secondary | ICD-10-CM | POA: Diagnosis not present

## 2021-06-27 DIAGNOSIS — M65342 Trigger finger, left ring finger: Secondary | ICD-10-CM | POA: Insufficient documentation

## 2021-06-27 DIAGNOSIS — M533 Sacrococcygeal disorders, not elsewhere classified: Secondary | ICD-10-CM | POA: Diagnosis not present

## 2021-06-27 MED ORDER — TRIAMCINOLONE ACETONIDE 40 MG/ML IJ SUSP
40.0000 mg | Freq: Once | INTRAMUSCULAR | Status: AC
  Administered 2021-06-27: 40 mg via INTRA_ARTICULAR

## 2021-06-27 NOTE — Assessment & Plan Note (Signed)
Acutely occurring.  Triggering is evident on exam. ?-Counseled on home exercise therapy and supportive care. ?-Injection today. ?-Provided splint. ?

## 2021-06-27 NOTE — Patient Instructions (Signed)
Good to see you ?Please use the splint at night ?Please use ice  ?Please try the exercises   ?Please send me a message in MyChart with any questions or updates.  ?Please see me back in 4 weeks.  ? ?--Dr. Raeford Razor ? ?

## 2021-06-27 NOTE — Assessment & Plan Note (Signed)
Acute on chronic in nature.  Does have degenerative changes of the facet joints but has more radicular pain today.  Does have tenderness over the SI joint. ?-Counseled on home exercise therapy and supportive care. ?-Injection today. ?-Could consider further imaging or physical therapy. ?

## 2021-06-27 NOTE — Progress Notes (Signed)
?Carolyn Mckinney - 76 y.o. female MRN [id number]  Date of birth: 09/30/1945 ? ?SUBJECTIVE:  Including CC & ROS.  ?No chief complaint on file. ? ? ?Carolyn Mckinney is a 76 y.o. female that is presenting with left trigger finger of the left hand and right SI joint pain with radicular pain.  The trigger finger has been present for a few weeks.  Seems to be getting worse over the past few days.  The right leg pain has been ongoing chronically. ? ? ? ?Review of Systems ?See HPI  ? ?HISTORY: Past Medical, Surgical, Social, and Family History Reviewed & Updated per EMR.   ?Pertinent Historical Findings include: ? ?Past Medical History:  ?Diagnosis Date  ? Allergy   ? Cancer [facility])   ? Breast  ? Cataract   ? cataract repair bilaterally  ? Closed patellar sleeve fracture of right knee 05/2019  ? Hypertension   ? Meniere's disease   ? Personal history of radiation therapy 2001  ? Left Breast Cancer  ? ? ?Past Surgical History:  ?Procedure Laterality Date  ? BREAST BIOPSY Left 2001  ? BREAST LUMPECTOMY Left 01/25/2000  ? COLONOSCOPY  2006  ? VAGINAL HYSTERECTOMY  1991  ? Done for bleeding. Benign pathology  ? ? ? ?PHYSICAL EXAM:  ?VS: BP 118/64   Ht '5\' 1"'$  (1.549 m)   Wt 140 lb (63.5 kg)   BMI 26.45 kg/m?  ?Physical Exam ?Gen: NAD, alert, cooperative with exam, well-appearing ?MSK:  ?Neurovascularly intact   ? ? ?Aspiration/Injection Procedure Note ?Kenton Kingfisher ?06-15-1945 ? ?Procedure: Injection ?Indications: Left trigger finger ? ?Procedure Details ?Consent: Risks of procedure as well as the alternatives and risks of each were explained to the (patient/caregiver).  Consent for procedure obtained. ?Time Out: Verified patient identification, verified procedure, site/side was marked, verified correct patient position, special equipment/implants available, medications/allergies/relevent history reviewed, required imaging and test results available.  Performed.  The area was cleaned with iodine and alcohol swabs.   ? ?The left  fourth digit trigger finger was injected using 1 cc's of 40 mg Kenalog and 1 cc's of 0.25% bupivacaine with a 25 1 1/2" needle.  Ultrasound was used. Images were obtained in long views showing the injection.   ? ? ?A sterile dressing was applied. ? ?Patient did tolerate procedure well. ? ?Aspiration/Injection Procedure Note ?Kenton Kingfisher ?Feb 08, 1946 ? ?Procedure: Injection ?Indications: Right SI joint pain ? ?Procedure Details ?Consent: Risks of procedure as well as the alternatives and risks of each were explained to the (patient/caregiver).  Consent for procedure obtained. ?Time Out: Verified patient identification, verified procedure, site/side was marked, verified correct patient position, special equipment/implants available, medications/allergies/relevent history reviewed, required imaging and test results available.  Performed.  The area was cleaned with iodine and alcohol swabs.   ? ?The right SI joint was injected using 3 cc of 1% lidocaine on a 22-gauge 3-1/2 inch needle.  The syringe was switched and a mixture containing 1 cc's of 40 mg Kenalog and 4 cc's of 0.25% bupivacaine was injected.  Ultrasound was needed to visualize the needle at its endpoint within the joint.  Ultrasound was used. Images were obtained in long views showing the injection.   ? ? ?A sterile dressing was applied. ? ?Patient did tolerate procedure well. ? ? ? ? ? ?ASSESSMENT & PLAN:  ? ?Trigger ring finger of left hand ?Acutely occurring.  Triggering is evident on exam. ?-Counseled on home exercise therapy and supportive care. ?-Injection today. ?-Provided splint. ? ?Chronic  right SI joint pain ?Acute on chronic in nature.  Does have degenerative changes of the facet joints but has more radicular pain today.  Does have tenderness over the SI joint. ?-Counseled on home exercise therapy and supportive care. ?-Injection today. ?-Could consider further imaging or physical therapy. ? ? ? ? ?

## 2021-07-05 ENCOUNTER — Encounter: Payer: Self-pay | Admitting: Family Medicine

## 2021-07-05 ENCOUNTER — Telehealth (INDEPENDENT_AMBULATORY_CARE_PROVIDER_SITE_OTHER): Payer: Medicare Other | Admitting: Family Medicine

## 2021-07-05 DIAGNOSIS — G4700 Insomnia, unspecified: Secondary | ICD-10-CM

## 2021-07-05 DIAGNOSIS — I10 Essential (primary) hypertension: Secondary | ICD-10-CM | POA: Diagnosis not present

## 2021-07-05 NOTE — Progress Notes (Signed)
Chief Complaint  ?Patient presents with  ? Follow-up  ?  6 month ?  ? ? ?Subjective ?Carolyn Mckinney is a 76 y.o. female who presents for hypertension follow up. Due to COVID-19 pandemic, we are interacting via web portal for an electronic face-to-face visit. I verified patient's ID using 2 identifiers. Patient agreed to proceed with visit via this method. Patient is at home, I am at office. Patient and I are present for visit.  ? ?She does not monitor home blood pressures. ?She is compliant with medications- losartan 100 mg/d, Norvasc 5 mg/d. ?Patient has these side effects of medication: none ?She is adhering to a healthy diet overall. ?Current exercise: walking ?No Cp or SOB.  ? ?Insomnia ?Has been taking trazodone 50-100 mg qhs with good relief. She reports compliance with no AE's.  ?  ?Past Medical History:  ?Diagnosis Date  ? Allergy   ? Cancer [facility])   ? Breast  ? Cataract   ? cataract repair bilaterally  ? Closed patellar sleeve fracture of right knee 05/2019  ? Hypertension   ? Meniere's disease   ? Personal history of radiation therapy 2001  ? Left Breast Cancer  ? ? ?Exam ?No conversational dyspnea ?Age appropriate judgment and insight ?Nml affect and mood ? ? ?Essential hypertension ? ?Insomnia, unspecified type ? ?Chronic, stable. Cont Norvasc 5 mg/d, Counseled on diet and exercise. ?Chronic, stable. Cont trazodone 50-100 mg qhs prn.  ?F/u in 6 mo or prn. ?The patient voiced understanding and agreement to the plan. ? ?Shelda Pal, DO ?07/05/21  ?1:40 PM ? ?

## 2021-07-15 ENCOUNTER — Encounter: Payer: Self-pay | Admitting: Neurology

## 2021-07-18 ENCOUNTER — Telehealth: Payer: Self-pay | Admitting: Neurology

## 2021-07-18 MED ORDER — DULOXETINE HCL 60 MG PO CPEP: 60.0000 mg | ORAL_CAPSULE | Freq: Every day | ORAL | 2 refills | Status: DC

## 2021-07-18 NOTE — Telephone Encounter (Signed)
Pt is concern about getting a refill on her medication while Dr Posey Pronto is out on Maternity leave. We had to move her appt from 08-26-21 to 02-24-22 for when Posey Pronto comes back. She states that medication is working really well  for her  ?

## 2021-07-18 NOTE — Telephone Encounter (Signed)
Called patient and informed her that a refill for Cymbalta has been sent. Patient was very thankful and had no further questions or concerns.  ?

## 2021-07-18 NOTE — Telephone Encounter (Signed)
I will send a new refill for Cymbalta '60mg'$  daily to take her to her next visit with me.  PCP is writing her Lyrica.  ?

## 2021-07-19 ENCOUNTER — Other Ambulatory Visit: Payer: Self-pay | Admitting: Family Medicine

## 2021-07-19 DIAGNOSIS — G4700 Insomnia, unspecified: Secondary | ICD-10-CM

## 2021-07-20 DIAGNOSIS — L700 Acne vulgaris: Secondary | ICD-10-CM | POA: Diagnosis not present

## 2021-07-20 DIAGNOSIS — L821 Other seborrheic keratosis: Secondary | ICD-10-CM | POA: Diagnosis not present

## 2021-07-20 DIAGNOSIS — Z79899 Other long term (current) drug therapy: Secondary | ICD-10-CM | POA: Diagnosis not present

## 2021-07-20 DIAGNOSIS — K130 Diseases of lips: Secondary | ICD-10-CM | POA: Diagnosis not present

## 2021-07-25 ENCOUNTER — Ambulatory Visit: Payer: Self-pay

## 2021-07-25 ENCOUNTER — Ambulatory Visit (INDEPENDENT_AMBULATORY_CARE_PROVIDER_SITE_OTHER): Payer: Medicare Other | Admitting: Family Medicine

## 2021-07-25 VITALS — BP 120/70 | Ht 61.0 in | Wt 140.0 lb

## 2021-07-25 DIAGNOSIS — M533 Sacrococcygeal disorders, not elsewhere classified: Secondary | ICD-10-CM

## 2021-07-25 DIAGNOSIS — G8929 Other chronic pain: Secondary | ICD-10-CM

## 2021-07-25 MED ORDER — TRIAMCINOLONE ACETONIDE 40 MG/ML IJ SUSP
40.0000 mg | Freq: Once | INTRAMUSCULAR | Status: AC
  Administered 2021-07-25: 40 mg via INTRA_ARTICULAR

## 2021-07-25 NOTE — Assessment & Plan Note (Signed)
Significant improvement on the right side with the injection. ?-Could consider further imaging or physical therapy. ?

## 2021-07-25 NOTE — Assessment & Plan Note (Signed)
Acutely occurring.  He is having tenderness over the left SI joint.  Did get improvement with the right SI joint injection.  She does have a slip at L4-5 which could be contributing as well ?-Counseled on home exercise therapy and supportive care. ?-Injection today. ?-Could consider physical therapy or further imaging. ? ? ?

## 2021-07-25 NOTE — Progress Notes (Signed)
?  Carolyn Mckinney - 76 y.o. female MRN [id number]  Date of birth: 19-Mar-1946 ? ?SUBJECTIVE:  Including CC & ROS.  ?No chief complaint on file. ? ? ?Carolyn Mckinney is a 76 y.o. female that is presenting with acute exacerbation of her left SI joint.  She has been improvement with the right SI joint pain but now occurring on the left side.  No longer having radicular pain down the right leg.  Having pain localized in the left SI joint.  No limitations in range of motion. ? ? ?Review of Systems ?See HPI  ? ?HISTORY: Past Medical, Surgical, Social, and Family History Reviewed & Updated per EMR.   ?Pertinent Historical Findings include: ? ?Past Medical History:  ?Diagnosis Date  ? Allergy   ? Cancer [facility])   ? Breast  ? Cataract   ? cataract repair bilaterally  ? Closed patellar sleeve fracture of right knee 05/2019  ? Hypertension   ? Meniere's disease   ? Personal history of radiation therapy 2001  ? Left Breast Cancer  ? ? ?Past Surgical History:  ?Procedure Laterality Date  ? BREAST BIOPSY Left 2001  ? BREAST LUMPECTOMY Left 01/25/2000  ? COLONOSCOPY  2006  ? VAGINAL HYSTERECTOMY  1991  ? Done for bleeding. Benign pathology  ? ? ? ?PHYSICAL EXAM:  ?VS: BP 120/70   Ht '5\' 1"'$  (1.549 m)   Wt 140 lb (63.5 kg)   BMI 26.45 kg/m?  ?Physical Exam ?Gen: NAD, alert, cooperative with exam, well-appearing ?MSK:  ?Neurovascularly intact   ? ? ?Aspiration/Injection Procedure Note ?Carolyn Mckinney ?12/12/1945 ? ?Procedure: Injection ?Indications: Left SI joint pain ? ?Procedure Details ?Consent: Risks of procedure as well as the alternatives and risks of each were explained to the (patient/caregiver).  Consent for procedure obtained. ?Time Out: Verified patient identification, verified procedure, site/side was marked, verified correct patient position, special equipment/implants available, medications/allergies/relevent history reviewed, required imaging and test results available.  Performed.  The area was cleaned with iodine and  alcohol swabs.   ? ?The left SI joint was injected using 3 cc of 1% lidocaine on a 22-gauge 3-1/2 inch needle.  The syringe was switched to mixture containing the 1 cc's of 40 mg Kenalog was or and 4 cc's of 0.25% bupivacaine was injected.  Ultrasound was needed to visualize the needle at its endpoint and the joint.  Right ultrasound was used. Images were obtained in long views showing the injection.   ? ? ?A sterile dressing was applied. ? ?Patient did tolerate procedure well. ? ? ? ? ?ASSESSMENT & PLAN:  ? ?Sacroiliac joint dysfunction of left side ?Acutely occurring.  He is having tenderness over the left SI joint.  Did get improvement with the right SI joint injection.  She does have a slip at L4-5 which could be contributing as well ?-Counseled on home exercise therapy and supportive care. ?-Injection today. ?-Could consider physical therapy or further imaging. ? ? ? ?Chronic right SI joint pain ?Significant improvement on the right side with the injection. ?-Could consider further imaging or physical therapy. ? ? ? ? ?

## 2021-07-25 NOTE — Patient Instructions (Signed)
Good to see you ?Please try heat  ?Please continue the stretches   ?Please send me a message in MyChart with any questions or updates.  ?Please see me back in 4-6 weeks.  ? ?--Dr. Raeford Razor ? ?

## 2021-08-23 DIAGNOSIS — L700 Acne vulgaris: Secondary | ICD-10-CM | POA: Diagnosis not present

## 2021-08-23 DIAGNOSIS — K130 Diseases of lips: Secondary | ICD-10-CM | POA: Diagnosis not present

## 2021-08-23 DIAGNOSIS — Z79899 Other long term (current) drug therapy: Secondary | ICD-10-CM | POA: Diagnosis not present

## 2021-08-25 ENCOUNTER — Other Ambulatory Visit (HOSPITAL_BASED_OUTPATIENT_CLINIC_OR_DEPARTMENT_OTHER): Payer: Self-pay

## 2021-08-26 ENCOUNTER — Ambulatory Visit: Payer: Medicare Other | Admitting: Neurology

## 2021-08-26 ENCOUNTER — Other Ambulatory Visit (HOSPITAL_BASED_OUTPATIENT_CLINIC_OR_DEPARTMENT_OTHER): Payer: Self-pay

## 2021-08-26 DIAGNOSIS — Z79899 Other long term (current) drug therapy: Secondary | ICD-10-CM | POA: Diagnosis not present

## 2021-08-26 DIAGNOSIS — L700 Acne vulgaris: Secondary | ICD-10-CM | POA: Diagnosis not present

## 2021-08-30 ENCOUNTER — Ambulatory Visit (INDEPENDENT_AMBULATORY_CARE_PROVIDER_SITE_OTHER): Payer: Medicare Other | Admitting: Family Medicine

## 2021-08-30 ENCOUNTER — Encounter: Payer: Self-pay | Admitting: Family Medicine

## 2021-08-30 DIAGNOSIS — M533 Sacrococcygeal disorders, not elsewhere classified: Secondary | ICD-10-CM | POA: Diagnosis not present

## 2021-08-30 DIAGNOSIS — G8929 Other chronic pain: Secondary | ICD-10-CM

## 2021-08-30 NOTE — Assessment & Plan Note (Signed)
Doing well with the most recent injection.  She had complete relief and had normal activity. ?-Counseled on home exercise therapy and supportive care. ?-Can repeat injection for treatment. ?

## 2021-08-30 NOTE — Progress Notes (Signed)
?  Carolyn Mckinney - 76 y.o. female MRN [id number]  Date of birth: Aug 03, 1945 ? ?SUBJECTIVE:  Including CC & ROS.  ?No chief complaint on file. ? ? ?Carolyn Mckinney is a 76 y.o. female that is following up after her SI joint injection.  She had significant pain relief with the injection on the right side.  She is able to do her normal activities with no pain. ? ? ? ?Review of Systems ?See HPI  ? ?HISTORY: Past Medical, Surgical, Social, and Family History Reviewed & Updated per EMR.   ?Pertinent Historical Findings include: ? ?Past Medical History:  ?Diagnosis Date  ? Allergy   ? Cancer [facility])   ? Breast  ? Cataract   ? cataract repair bilaterally  ? Closed patellar sleeve fracture of right knee 05/2019  ? Hypertension   ? Meniere's disease   ? Personal history of radiation therapy 2001  ? Left Breast Cancer  ? ? ?Past Surgical History:  ?Procedure Laterality Date  ? BREAST BIOPSY Left 2001  ? BREAST LUMPECTOMY Left 01/25/2000  ? COLONOSCOPY  2006  ? VAGINAL HYSTERECTOMY  1991  ? Done for bleeding. Benign pathology  ? ? ? ?PHYSICAL EXAM:  ?VS: BP 108/60 (BP Location: Left Arm, Patient Position: Sitting)   Ht '5\' 1"'$  (1.549 m)   Wt 140 lb (63.5 kg)   BMI 26.45 kg/m?  ?Physical Exam ?Gen: NAD, alert, cooperative with exam, well-appearing ?MSK:  ?Neurovascularly intact   ? ? ? ? ?ASSESSMENT & PLAN:  ? ?Chronic right SI joint pain ?Doing well with the most recent injection.  She had complete relief and had normal activity. ?-Counseled on home exercise therapy and supportive care. ?-Can repeat injection for treatment. ? ? ? ? ?

## 2021-08-31 ENCOUNTER — Other Ambulatory Visit: Payer: Self-pay | Admitting: Neurology

## 2021-09-05 ENCOUNTER — Telehealth: Payer: Self-pay | Admitting: Family Medicine

## 2021-09-05 NOTE — Telephone Encounter (Signed)
Pt called states she had some labs done ordered by her Dermatologist--results came back that concerned her doctor(she was advised to stop taking pain meds  ? ?--Advised pt to send lab results for review. ? ?----Also do "Not" see the medication she is taking prescribed by dermatology provider in her chart (Isotretinion)--advised pt would forward message to [place] for review & he or med staff would contact her. ?

## 2021-09-06 ENCOUNTER — Other Ambulatory Visit: Payer: Self-pay

## 2021-09-06 ENCOUNTER — Encounter (HOSPITAL_BASED_OUTPATIENT_CLINIC_OR_DEPARTMENT_OTHER): Payer: Self-pay | Admitting: Emergency Medicine

## 2021-09-06 ENCOUNTER — Encounter: Payer: Self-pay | Admitting: Family Medicine

## 2021-09-06 ENCOUNTER — Inpatient Hospital Stay (HOSPITAL_BASED_OUTPATIENT_CLINIC_OR_DEPARTMENT_OTHER)
Admission: EM | Admit: 2021-09-06 | Discharge: 2021-09-10 | DRG: 193 | Disposition: A | Discharge status: Skilled Nursing Facility | Payer: Medicare Other | Attending: Internal Medicine | Admitting: Internal Medicine

## 2021-09-06 ENCOUNTER — Other Ambulatory Visit: Payer: Self-pay | Admitting: Family Medicine

## 2021-09-06 ENCOUNTER — Telehealth: Payer: Self-pay | Admitting: Family Medicine

## 2021-09-06 DIAGNOSIS — M533 Sacrococcygeal disorders, not elsewhere classified: Secondary | ICD-10-CM | POA: Diagnosis present

## 2021-09-06 DIAGNOSIS — G894 Chronic pain syndrome: Secondary | ICD-10-CM | POA: Diagnosis present

## 2021-09-06 DIAGNOSIS — R079 Chest pain, unspecified: Secondary | ICD-10-CM | POA: Diagnosis not present

## 2021-09-06 DIAGNOSIS — Z8249 Family history of ischemic heart disease and other diseases of the circulatory system: Secondary | ICD-10-CM | POA: Diagnosis not present

## 2021-09-06 DIAGNOSIS — R7989 Other specified abnormal findings of blood chemistry: Secondary | ICD-10-CM | POA: Diagnosis not present

## 2021-09-06 DIAGNOSIS — I10 Essential (primary) hypertension: Secondary | ICD-10-CM | POA: Diagnosis present

## 2021-09-06 DIAGNOSIS — G629 Polyneuropathy, unspecified: Secondary | ICD-10-CM | POA: Diagnosis present

## 2021-09-06 DIAGNOSIS — L709 Acne, unspecified: Secondary | ICD-10-CM

## 2021-09-06 DIAGNOSIS — D539 Nutritional anemia, unspecified: Secondary | ICD-10-CM | POA: Diagnosis present

## 2021-09-06 DIAGNOSIS — E871 Hypo-osmolality and hyponatremia: Secondary | ICD-10-CM | POA: Diagnosis present

## 2021-09-06 DIAGNOSIS — Z79899 Other long term (current) drug therapy: Secondary | ICD-10-CM | POA: Diagnosis not present

## 2021-09-06 DIAGNOSIS — J189 Pneumonia, unspecified organism: Principal | ICD-10-CM | POA: Diagnosis present

## 2021-09-06 DIAGNOSIS — M4726 Other spondylosis with radiculopathy, lumbar region: Secondary | ICD-10-CM | POA: Diagnosis present

## 2021-09-06 DIAGNOSIS — Z90710 Acquired absence of both cervix and uterus: Secondary | ICD-10-CM

## 2021-09-06 DIAGNOSIS — J302 Other seasonal allergic rhinitis: Secondary | ICD-10-CM | POA: Diagnosis present

## 2021-09-06 DIAGNOSIS — G8929 Other chronic pain: Secondary | ICD-10-CM | POA: Diagnosis not present

## 2021-09-06 DIAGNOSIS — J9601 Acute respiratory failure with hypoxia: Secondary | ICD-10-CM | POA: Diagnosis not present

## 2021-09-06 DIAGNOSIS — Z853 Personal history of malignant neoplasm of breast: Secondary | ICD-10-CM

## 2021-09-06 DIAGNOSIS — M5127 Other intervertebral disc displacement, lumbosacral region: Secondary | ICD-10-CM | POA: Diagnosis not present

## 2021-09-06 DIAGNOSIS — Z8261 Family history of arthritis: Secondary | ICD-10-CM | POA: Diagnosis not present

## 2021-09-06 DIAGNOSIS — M545 Low back pain, unspecified: Secondary | ICD-10-CM | POA: Diagnosis not present

## 2021-09-06 DIAGNOSIS — Z20822 Contact with and (suspected) exposure to covid-19: Secondary | ICD-10-CM | POA: Diagnosis present

## 2021-09-06 DIAGNOSIS — M546 Pain in thoracic spine: Secondary | ICD-10-CM | POA: Diagnosis not present

## 2021-09-06 DIAGNOSIS — F339 Major depressive disorder, recurrent, unspecified: Secondary | ICD-10-CM | POA: Diagnosis not present

## 2021-09-06 DIAGNOSIS — Z923 Personal history of irradiation: Secondary | ICD-10-CM | POA: Diagnosis not present

## 2021-09-06 DIAGNOSIS — R0902 Hypoxemia: Principal | ICD-10-CM

## 2021-09-06 DIAGNOSIS — M25551 Pain in right hip: Secondary | ICD-10-CM | POA: Diagnosis not present

## 2021-09-06 DIAGNOSIS — M5416 Radiculopathy, lumbar region: Secondary | ICD-10-CM | POA: Diagnosis not present

## 2021-09-06 DIAGNOSIS — M47817 Spondylosis without myelopathy or radiculopathy, lumbosacral region: Secondary | ICD-10-CM | POA: Diagnosis not present

## 2021-09-06 DIAGNOSIS — M2550 Pain in unspecified joint: Secondary | ICD-10-CM | POA: Diagnosis not present

## 2021-09-06 DIAGNOSIS — Z9981 Dependence on supplemental oxygen: Secondary | ICD-10-CM | POA: Diagnosis not present

## 2021-09-06 DIAGNOSIS — D559 Anemia due to enzyme disorder, unspecified: Secondary | ICD-10-CM | POA: Diagnosis not present

## 2021-09-06 DIAGNOSIS — Z7401 Bed confinement status: Secondary | ICD-10-CM | POA: Diagnosis not present

## 2021-09-06 DIAGNOSIS — E861 Hypovolemia: Secondary | ICD-10-CM | POA: Diagnosis present

## 2021-09-06 DIAGNOSIS — Z79891 Long term (current) use of opiate analgesic: Secondary | ICD-10-CM | POA: Diagnosis not present

## 2021-09-06 DIAGNOSIS — G4700 Insomnia, unspecified: Secondary | ICD-10-CM | POA: Diagnosis not present

## 2021-09-06 DIAGNOSIS — R52 Pain, unspecified: Secondary | ICD-10-CM

## 2021-09-06 DIAGNOSIS — D649 Anemia, unspecified: Secondary | ICD-10-CM | POA: Diagnosis not present

## 2021-09-06 DIAGNOSIS — H8103 Meniere's disease, bilateral: Secondary | ICD-10-CM | POA: Diagnosis not present

## 2021-09-06 DIAGNOSIS — H269 Unspecified cataract: Secondary | ICD-10-CM | POA: Diagnosis not present

## 2021-09-06 LAB — COMPREHENSIVE METABOLIC PANEL
ALT: 19 U/L (ref 0–44)
AST: 27 U/L (ref 15–41)
Albumin: 3.4 g/dL — ABNORMAL LOW (ref 3.5–5.0)
Alkaline Phosphatase: 69 U/L (ref 38–126)
Anion gap: 7 (ref 5–15)
BUN: 12 mg/dL (ref 8–23)
CO2: 26 mmol/L (ref 22–32)
Calcium: 8.7 mg/dL — ABNORMAL LOW (ref 8.9–10.3)
Chloride: 98 mmol/L (ref 98–111)
Creatinine, Ser: 0.63 mg/dL (ref 0.44–1.00)
GFR, Estimated: >60 mL/min (ref >60)
Glucose, Bld: 105 mg/dL — ABNORMAL HIGH (ref 70–99)
Potassium: 4.1 mmol/L (ref 3.5–5.1)
Sodium: 131 mmol/L — ABNORMAL LOW (ref 135–145)
Total Bilirubin: 1 mg/dL (ref 0.3–1.2)
Total Protein: 7 g/dL (ref 6.5–8.1)

## 2021-09-06 LAB — CBC WITH DIFFERENTIAL/PLATELET
Abs Immature Granulocytes: 0.03 10*3/uL (ref 0.00–0.07)
Basophils Absolute: 0.1 10*3/uL (ref 0.0–0.1)
Basophils Relative: 1 %
Eosinophils Absolute: 0.1 10*3/uL (ref 0.0–0.5)
Eosinophils Relative: 1 %
HCT: 36.8 % (ref 36.0–46.0)
Hemoglobin: 12.8 g/dL (ref 12.0–15.0)
Immature Granulocytes: 0 %
Lymphocytes Relative: 13 %
Lymphs Abs: 1.4 10*3/uL (ref 0.7–4.0)
MCH: 33.6 pg (ref 26.0–34.0)
MCHC: 34.8 g/dL (ref 30.0–36.0)
MCV: 96.6 fL (ref 80.0–100.0)
Monocytes Absolute: 1.5 10*3/uL — ABNORMAL HIGH (ref 0.1–1.0)
Monocytes Relative: 14 %
Neutro Abs: 7.7 10*3/uL (ref 1.7–7.7)
Neutrophils Relative %: 71 %
Platelets: 278 10*3/uL (ref 150–400)
RBC: 3.81 MIL/uL — ABNORMAL LOW (ref 3.87–5.11)
RDW: 12.7 % (ref 11.5–15.5)
WBC: 10.8 10*3/uL — ABNORMAL HIGH (ref 4.0–10.5)
nRBC: 0 % (ref 0.0–0.2)

## 2021-09-06 LAB — LIPASE, BLOOD: Lipase: 23 U/L (ref 11–51)

## 2021-09-06 MED ORDER — HYDROMORPHONE HCL 1 MG/ML IJ SOLN
1.0000 mg | Freq: Once | INTRAMUSCULAR | Status: AC
  Administered 2021-09-06: 1 mg via INTRAVENOUS
  Filled 2021-09-06 (×2): qty 1

## 2021-09-06 MED ORDER — SODIUM CHLORIDE 0.9 % IV BOLUS
500.0000 mL | Freq: Once | INTRAVENOUS | Status: AC
  Administered 2021-09-06: 500 mL via INTRAVENOUS

## 2021-09-06 MED ORDER — SODIUM CHLORIDE 0.9 % IV SOLN
INTRAVENOUS | Status: DC
  Administered 2021-09-07: 1000 mL via INTRAVENOUS

## 2021-09-06 MED ORDER — ONDANSETRON HCL 4 MG/2ML IJ SOLN
4.0000 mg | Freq: Once | INTRAMUSCULAR | Status: AC
  Administered 2021-09-06: 4 mg via INTRAVENOUS
  Filled 2021-09-06: qty 2

## 2021-09-06 MED ORDER — HYDROCODONE-ACETAMINOPHEN 5-325 MG PO TABS: 1.0000 | ORAL_TABLET | Freq: Four times a day (QID) | ORAL | 0 refills | Status: DC | PRN

## 2021-09-06 MED ORDER — ONDANSETRON 4 MG PO TBDP: 4.0000 mg | ORAL_TABLET | Freq: Three times a day (TID) | ORAL | 1 refills | Status: DC | PRN

## 2021-09-06 NOTE — ED Notes (Signed)
Pt mild distress in bed, a/ox4. When touched, pt moans in a very loud, high pitched moan. Pt states she has been having increased side and back pain to her legs, but when touching her arm to slide her sleeve up, pt says that's also hurting. Denies any kind of trauma/disease process ?

## 2021-09-06 NOTE — Discharge Instructions (Addendum)
Take the hydrocodone as needed for pain.  Take the Zofran as needed for nausea.  Make an appointment follow-up with your doctors. ?

## 2021-09-06 NOTE — Telephone Encounter (Signed)
Pt cld back to ask that we get her labs from [facility] to review them  & she also ask if Dr.Schmitz would order an MRI due to her on going pain . ? ?--Forwarding addt'l message to medical asst for review w/provider. ? ?-glh ?

## 2021-09-06 NOTE — Telephone Encounter (Signed)
I called [facility] in [place] and requested patient's labs be faxed to [place].  ?

## 2021-09-06 NOTE — Telephone Encounter (Signed)
Called the patient back and spoke to her daughter. ?The patient is in a lot of pain and the daughter felt best to take her to the ER. ?She is taking her to the ER in our building this afternoon. ?

## 2021-09-06 NOTE — Telephone Encounter (Signed)
Called left message to call back 

## 2021-09-06 NOTE — Telephone Encounter (Signed)
Pt's daughter states pt is experiencing a great amount of pain within her kidneys and entire body. Transferred pt to triage for symptoms  ?

## 2021-09-06 NOTE — ED Provider Notes (Addendum)
[facility] EMERGENCY DEPARTMENT Provider Note   CSN: [id number] Arrival date & time: 09/06/21  1840     History  Chief Complaint  Patient presents with   Back Pain   Flank Pain   Hip Pain    Carolyn Mckinney is a 76 y.o. female.  Patient with a history of neuropathy and chronic pain.  Patient with worsening pain since Friday.  Lives by herself.  Also followed by Dr. Allena Katz from [facility] neurology followed by her primary care doctor Dr. Carmelia Roller.  And followed by sports medicine Dr. Jordan Likes.  He has done injections in the sacral iliac area in the past.  Patient also followed by dermatology and is on Accutane.  And was told to stop taking Motrin.  She thinks this may have caused increased pain since Friday.  Patient currently on Lyrica trazodone Cymbalta.  Also has a history of hypertension and is on Norvasc.  Past surgical history is significant for vaginal hysterectomy.  Patient has never smoked.  Patient with sort of pain all over.  But most of the pain is kind of lumbar back and some lower thoracic part of the back.  Patient with difficulty walking because movement of her legs causes pain.  But no new numbness or weakness to her feet.  No incontinence.  No fall or injury.  No neck pain of any significance.  And no headache.  Patient's temp 99.4.  Patient states she did not feel as if she had a fever.  Patient without any upper respiratory symptoms.  No anterior abdominal pain.      Home Medications Prior to Admission medications   Medication Sig Start Date End Date Taking? Authorizing Provider  HYDROcodone-acetaminophen (NORCO/VICODIN) 5-325 MG tablet Take 1 tablet by mouth every 6 (six) hours as needed for moderate pain. 09/06/21  Yes Vanetta Mulders, MD  ondansetron (ZOFRAN-ODT) 4 MG disintegrating tablet Take 1 tablet (4 mg total) by mouth every 8 (eight) hours as needed. 09/06/21  Yes Vanetta Mulders, MD  amLODipine (NORVASC) 5 MG tablet Take 1 tablet by mouth once daily  07/19/21   Sharlene Dory, DO  Calcium Carb-Cholecalciferol (CALCIUM + D3 PO) Take by mouth.    [provider]  COVID-19 mRNA bivalent vaccine, Pfizer, (PFIZER COVID-19 VAC BIVALENT) injection Inject into the muscle. 05/03/21   Judyann Munson, MD  Diclofenac Sodium (PENNSAID) 2 % SOLN Apply 2 pumps BID to the affected area 06/02/20   Myra Rude, MD  DULoxetine (CYMBALTA) 60 MG capsule Take 1 capsule (60 mg total) by mouth daily. 07/18/21   Nita Sickle K, DO  Ferrous Sulfate (IRON PO) Take by mouth.    [provider]  losartan (COZAAR) 100 MG tablet Take 1 tablet (100 mg total) by mouth daily. 01/04/21   Sharlene Dory, DO  Multiple Vitamin (MULTIVITAMIN ADULT PO) Take by mouth.    [provider]  pregabalin (LYRICA) 150 MG capsule Take 1 capsule by mouth twice daily 06/21/21   Sharlene Dory, DO  traZODone (DESYREL) 50 MG tablet TAKE 1 TO 2 TABLETS BY MOUTH AT BEDTIME AS NEEDED FOR SLEEP 07/19/21   Wendling, Jilda Roche, DO  valACYclovir (VALTREX) 500 MG tablet TAKE 4 TABLETS BY MOUTH AT ONSET OF BREAKOUT AND REPEAT IN 12 HOURS. OTHERWISE, TAKE 1 TABLET BY MOUTH ONCE DAILY 05/24/21   Sharlene Dory, DO  VITAMIN K PO Take by mouth.    [provider]  Zoster Vaccine Adjuvanted [facility]) injection Inject into the  muscle. 05/03/21   Judyann Munson, MD      Allergies    Patient has no known allergies.    Review of Systems   Review of Systems  Constitutional:  Negative for chills and fever.  HENT:  Negative for ear pain and sore throat.   Eyes:  Negative for pain and visual disturbance.  Respiratory:  Negative for cough and shortness of breath.   Cardiovascular:  Negative for chest pain and palpitations.  Gastrointestinal:  Negative for abdominal pain and vomiting.  Genitourinary:  Negative for dysuria and hematuria.  Musculoskeletal:  Positive for back pain. Negative for arthralgias and neck pain.  Skin:  Negative  for color change and rash.  Neurological:  Negative for seizures and syncope.  All other systems reviewed and are negative.  Physical Exam Updated Vital Signs BP 137/79 (BP Location: Left Arm)   Pulse 86   Temp 99.4 F (37.4 C) (Oral)   Resp 18   Ht 1.549 m (5\' 1" )   Wt 63.5 kg   SpO2 100%   BMI 26.45 kg/m  Physical Exam Vitals and nursing note reviewed.  Constitutional:      General: She is in acute distress.     Appearance: Normal appearance. She is well-developed.  HENT:     Head: Normocephalic and atraumatic.  Eyes:     Extraocular Movements: Extraocular movements intact.     Conjunctiva/sclera: Conjunctivae normal.     Pupils: Pupils are equal, round, and reactive to light.  Cardiovascular:     Rate and Rhythm: Normal rate and regular rhythm.     Heart sounds: No murmur heard. Pulmonary:     Effort: Pulmonary effort is normal. No respiratory distress.     Breath sounds: Normal breath sounds.  Abdominal:     Palpations: Abdomen is soft.     Tenderness: There is no abdominal tenderness.  Musculoskeletal:        General: No swelling.     Cervical back: Neck supple.  Skin:    General: Skin is warm and dry.     Capillary Refill: Capillary refill takes less than 2 seconds.  Neurological:     General: No focal deficit present.     Mental Status: She is alert and oriented to person, place, and time.     Cranial Nerves: No cranial nerve deficit.     Sensory: Sensory deficit present.     Motor: No weakness.     Comments: Some baseline lower extremity neuropathy.  But patient has sensation intact on top and bottom of the feet.  Dorsalis pedis pulses 2+.  Constantly moving her feet.  Does not to move her legs.  Psychiatric:        Mood and Affect: Mood normal.    ED Results / Procedures / Treatments   Labs (all labs ordered are listed, but only abnormal results are displayed) Labs Reviewed  COMPREHENSIVE METABOLIC PANEL - Abnormal; Notable for the following  components:      Result Value   Sodium 131 (*)    Glucose, Bld 105 (*)    Calcium 8.7 (*)    Albumin 3.4 (*)    All other components within normal limits  CBC WITH DIFFERENTIAL/PLATELET - Abnormal; Notable for the following components:   WBC 10.8 (*)    RBC 3.81 (*)    Monocytes Absolute 1.5 (*)    All other components within normal limits  RESP PANEL BY RT-PCR (FLU A&B, COVID) ARPGX2  LIPASE, BLOOD  URINALYSIS, ROUTINE W REFLEX MICROSCOPIC    EKG None  Radiology No results found.  Procedures Procedures    Medications Ordered in ED Medications  0.9 %  sodium chloride infusion (has no administration in time range)  sodium chloride 0.9 % bolus 500 mL (500 mLs Intravenous New Bag/Given 09/06/21 2322)  ondansetron (ZOFRAN) injection 4 mg (4 mg Intravenous Given 09/06/21 2323)  HYDROmorphone (DILAUDID) injection 1 mg (1 mg Intravenous Given 09/06/21 2323)    ED Course/ Medical Decision Making/ A&P                           Medical Decision Making Amount and/or Complexity of Data Reviewed Labs: ordered.  Risk Prescription drug management.   Patient certainly with chronic pain.  Seems to be worse since Friday.  Patient not on any narcotic pain medicines.  Patient thinks is because she was told by her dermatologist stop the Motrin.  But not clear.  We will give some IV fluids here hydromorphone and Zofran.  Will check CBC complete metabolic panel lipase.  And then reassess  Will be turned over to overnight physician Dr. Read Drivers for recheck.   CBC is significant for mild leukocytosis with a white count of 10.8.  Hemoglobin is good at 12.8.  Platelets normal at 278.  Rest of labs still pending.  Patient with hypoxia here which is a bit puzzling.  Her sats go down to 86%.  Lungs sound clear.  Temp not a true fever but elevated some.  Patient placed on 2 L of oxygen oxygen sats go up to 92%.  Based on this we will get chest x-ray we will get urine we will do COVID testing  patient may end up needing a CT of the chest.  Depending.  Patient based on this hypoxia probably going to require admission.  The oxygen level was low prior to receiving the pain medication.  Final Clinical Impression(s) / ED Diagnoses Final diagnoses:  Acute right-sided low back pain without sciatica  Right hip pain  Chronic pain syndrome  Hypoxia    Rx / DC Orders ED Discharge Orders          Ordered    HYDROcodone-acetaminophen (NORCO/VICODIN) 5-325 MG tablet  Every 6 hours PRN        09/06/21 2340    ondansetron (ZOFRAN-ODT) 4 MG disintegrating tablet  Every 8 hours PRN        09/06/21 2340              Vanetta Mulders, MD 09/06/21 1610    Vanetta Mulders, MD 09/07/21 0008

## 2021-09-06 NOTE — ED Triage Notes (Signed)
Pt state lower back/side pain on right sided that's gotten worse last few days. Has Hx of neuropathy.  ?

## 2021-09-07 ENCOUNTER — Emergency Department (HOSPITAL_BASED_OUTPATIENT_CLINIC_OR_DEPARTMENT_OTHER): Payer: Medicare Other

## 2021-09-07 ENCOUNTER — Other Ambulatory Visit: Payer: Self-pay | Admitting: Family Medicine

## 2021-09-07 ENCOUNTER — Telehealth: Payer: Self-pay

## 2021-09-07 DIAGNOSIS — M47817 Spondylosis without myelopathy or radiculopathy, lumbosacral region: Secondary | ICD-10-CM | POA: Diagnosis not present

## 2021-09-07 DIAGNOSIS — R0902 Hypoxemia: Secondary | ICD-10-CM | POA: Diagnosis present

## 2021-09-07 DIAGNOSIS — J189 Pneumonia, unspecified organism: Secondary | ICD-10-CM | POA: Diagnosis present

## 2021-09-07 DIAGNOSIS — Z79899 Other long term (current) drug therapy: Secondary | ICD-10-CM | POA: Diagnosis not present

## 2021-09-07 DIAGNOSIS — M546 Pain in thoracic spine: Secondary | ICD-10-CM | POA: Diagnosis not present

## 2021-09-07 DIAGNOSIS — R7989 Other specified abnormal findings of blood chemistry: Secondary | ICD-10-CM | POA: Diagnosis not present

## 2021-09-07 DIAGNOSIS — J302 Other seasonal allergic rhinitis: Secondary | ICD-10-CM | POA: Diagnosis present

## 2021-09-07 DIAGNOSIS — G4700 Insomnia, unspecified: Secondary | ICD-10-CM | POA: Diagnosis not present

## 2021-09-07 DIAGNOSIS — D559 Anemia due to enzyme disorder, unspecified: Secondary | ICD-10-CM | POA: Diagnosis not present

## 2021-09-07 DIAGNOSIS — R079 Chest pain, unspecified: Secondary | ICD-10-CM | POA: Diagnosis not present

## 2021-09-07 DIAGNOSIS — M545 Low back pain, unspecified: Secondary | ICD-10-CM | POA: Diagnosis not present

## 2021-09-07 DIAGNOSIS — Z79891 Long term (current) use of opiate analgesic: Secondary | ICD-10-CM | POA: Diagnosis not present

## 2021-09-07 DIAGNOSIS — G629 Polyneuropathy, unspecified: Secondary | ICD-10-CM | POA: Diagnosis present

## 2021-09-07 DIAGNOSIS — H269 Unspecified cataract: Secondary | ICD-10-CM | POA: Diagnosis not present

## 2021-09-07 DIAGNOSIS — M5416 Radiculopathy, lumbar region: Secondary | ICD-10-CM

## 2021-09-07 DIAGNOSIS — D649 Anemia, unspecified: Secondary | ICD-10-CM | POA: Diagnosis not present

## 2021-09-07 DIAGNOSIS — F339 Major depressive disorder, recurrent, unspecified: Secondary | ICD-10-CM | POA: Diagnosis not present

## 2021-09-07 DIAGNOSIS — D539 Nutritional anemia, unspecified: Secondary | ICD-10-CM | POA: Diagnosis present

## 2021-09-07 DIAGNOSIS — E871 Hypo-osmolality and hyponatremia: Secondary | ICD-10-CM | POA: Diagnosis present

## 2021-09-07 DIAGNOSIS — I10 Essential (primary) hypertension: Secondary | ICD-10-CM

## 2021-09-07 DIAGNOSIS — Z20822 Contact with and (suspected) exposure to covid-19: Secondary | ICD-10-CM | POA: Diagnosis present

## 2021-09-07 DIAGNOSIS — Z923 Personal history of irradiation: Secondary | ICD-10-CM | POA: Diagnosis not present

## 2021-09-07 DIAGNOSIS — J9601 Acute respiratory failure with hypoxia: Secondary | ICD-10-CM | POA: Diagnosis present

## 2021-09-07 DIAGNOSIS — Z90710 Acquired absence of both cervix and uterus: Secondary | ICD-10-CM | POA: Diagnosis not present

## 2021-09-07 DIAGNOSIS — H8103 Meniere's disease, bilateral: Secondary | ICD-10-CM | POA: Diagnosis not present

## 2021-09-07 DIAGNOSIS — Z853 Personal history of malignant neoplasm of breast: Secondary | ICD-10-CM | POA: Diagnosis not present

## 2021-09-07 DIAGNOSIS — Z8261 Family history of arthritis: Secondary | ICD-10-CM | POA: Diagnosis not present

## 2021-09-07 DIAGNOSIS — Z9981 Dependence on supplemental oxygen: Secondary | ICD-10-CM | POA: Diagnosis not present

## 2021-09-07 DIAGNOSIS — M533 Sacrococcygeal disorders, not elsewhere classified: Secondary | ICD-10-CM | POA: Diagnosis present

## 2021-09-07 DIAGNOSIS — Z7401 Bed confinement status: Secondary | ICD-10-CM | POA: Diagnosis not present

## 2021-09-07 DIAGNOSIS — M5127 Other intervertebral disc displacement, lumbosacral region: Secondary | ICD-10-CM | POA: Diagnosis not present

## 2021-09-07 DIAGNOSIS — G894 Chronic pain syndrome: Secondary | ICD-10-CM | POA: Diagnosis present

## 2021-09-07 DIAGNOSIS — M2550 Pain in unspecified joint: Secondary | ICD-10-CM | POA: Diagnosis not present

## 2021-09-07 DIAGNOSIS — G8929 Other chronic pain: Secondary | ICD-10-CM | POA: Diagnosis not present

## 2021-09-07 DIAGNOSIS — Z8249 Family history of ischemic heart disease and other diseases of the circulatory system: Secondary | ICD-10-CM | POA: Diagnosis not present

## 2021-09-07 DIAGNOSIS — E861 Hypovolemia: Secondary | ICD-10-CM | POA: Diagnosis present

## 2021-09-07 DIAGNOSIS — M4726 Other spondylosis with radiculopathy, lumbar region: Secondary | ICD-10-CM | POA: Diagnosis present

## 2021-09-07 LAB — URINALYSIS, ROUTINE W REFLEX MICROSCOPIC
Bilirubin Urine: NEGATIVE
Glucose, UA: NEGATIVE mg/dL
Hgb urine dipstick: NEGATIVE
Ketones, ur: 15 mg/dL — AB
Nitrite: NEGATIVE
Protein, ur: NEGATIVE mg/dL
Specific Gravity, Urine: 1.015 (ref 1.005–1.030)
pH: 7 (ref 5.0–8.0)

## 2021-09-07 LAB — URINALYSIS, MICROSCOPIC (REFLEX)

## 2021-09-07 LAB — RESP PANEL BY RT-PCR (FLU A&B, COVID) ARPGX2
Influenza A by PCR: NEGATIVE
Influenza B by PCR: NEGATIVE
SARS Coronavirus 2 by RT PCR: NEGATIVE

## 2021-09-07 LAB — D-DIMER, QUANTITATIVE: D-Dimer, Quant: 2 ug/mL-FEU — ABNORMAL HIGH (ref 0.00–0.50)

## 2021-09-07 IMAGING — DX DG CHEST 1V PORT
1 series · 1 of 1 positions shown · non-contrast
Comparison: None Available.

CLINICAL DATA: Back pain.

EXAM:
PORTABLE CHEST 1 VIEW

[chest ap]
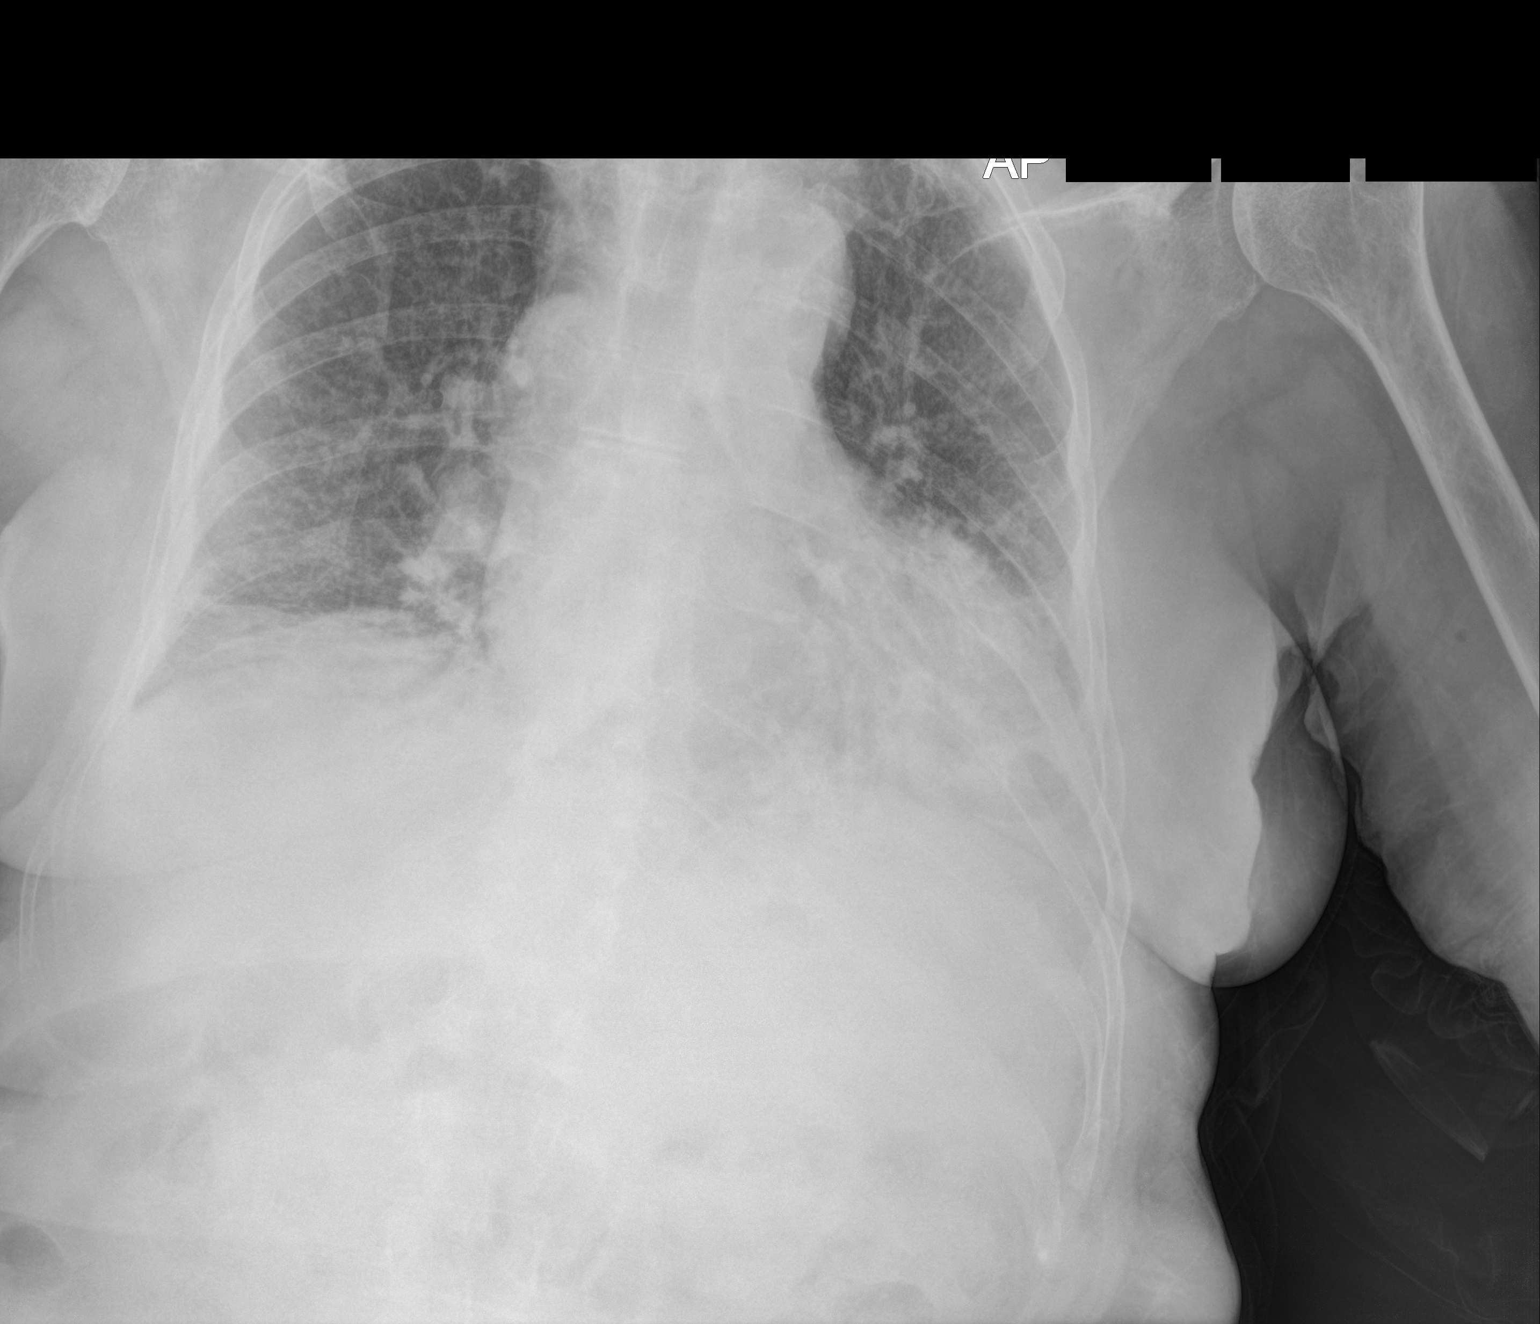

[1 of 1 positions shown; findings below may reference images not displayed]

FINDINGS: Diffuse chronic interstitial coarsening. There are bibasilar
atelectasis or scarring. No focal consolidation, pleural effusion,
pneumothorax. Top-normal cardiac silhouette. Osteopenia with
degenerative changes of the spine. No acute osseous pathology.
IMPRESSION: No active disease.

## 2021-09-07 IMAGING — CT CT ANGIO CHEST
2 of 8 series · 18 of 36 positions shown · IV contrast (Omnipaque)
Comparison: Chest radiograph dated [DATE]

CLINICAL DATA: Chest/back pain, positive D-dimer. History of breast
cancer.

EXAM:
CT ANGIOGRAPHY CHEST WITH CONTRAST
TECHNIQUE: Multidetector CT imaging of the chest was performed using the
standard protocol during bolus administration of intravenous
contrast. Multiplanar CT image reconstructions and MIPs were
obtained to evaluate the vascular anatomy.

[Series 6: pe thins · axial · 0.68mm/px · z∈[-194,-6]mm · 17 of 212 slices shown]
[im 12/212  lung]
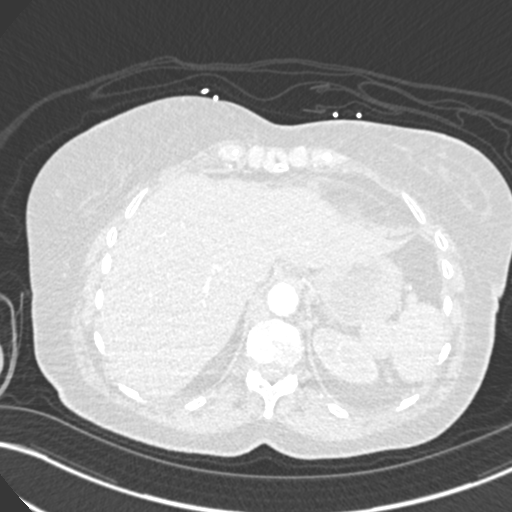
[im 23/212  mediastinal]
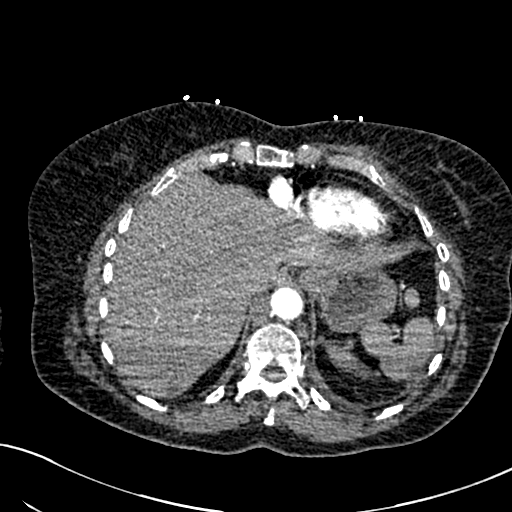
[im 34/212  lung]
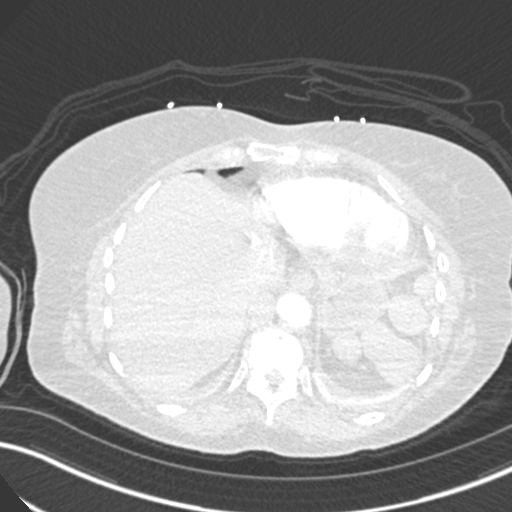
[im 45/212  mediastinal]
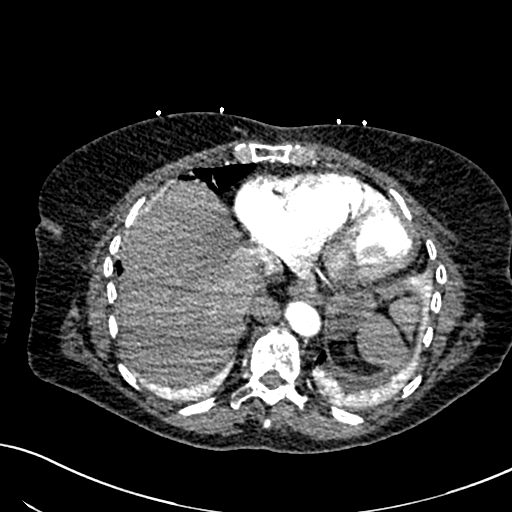
[im 56/212  lung]
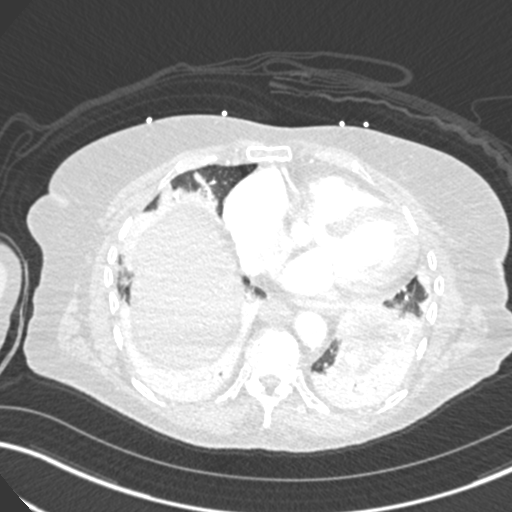
[im 67/212  mediastinal]
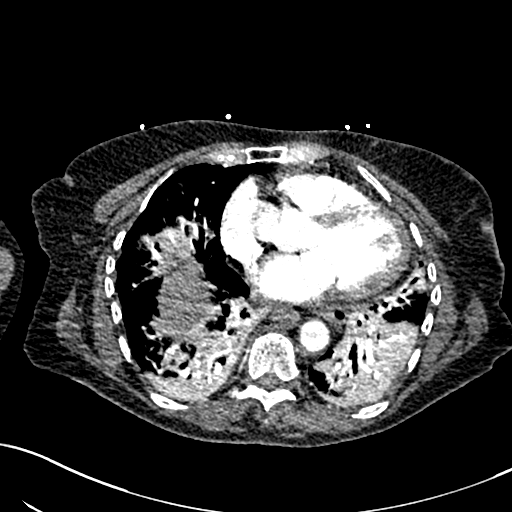
[im 78/212  lung]
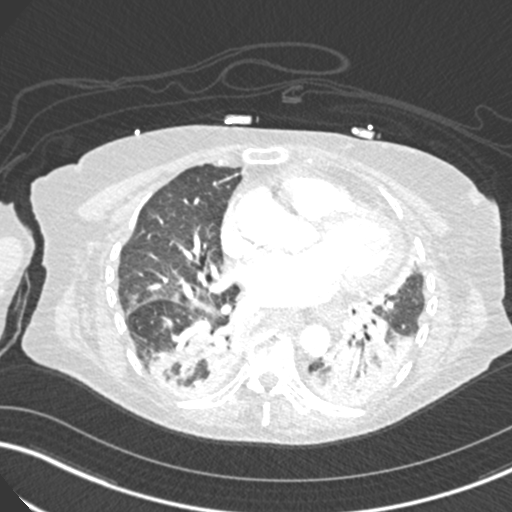
[im 89/212  mediastinal]
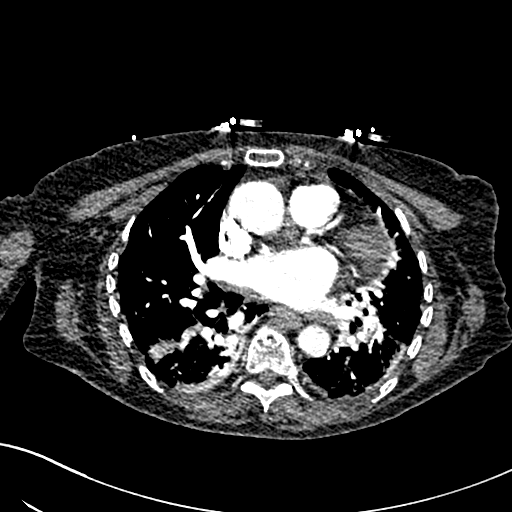
[im 112/212  lung]
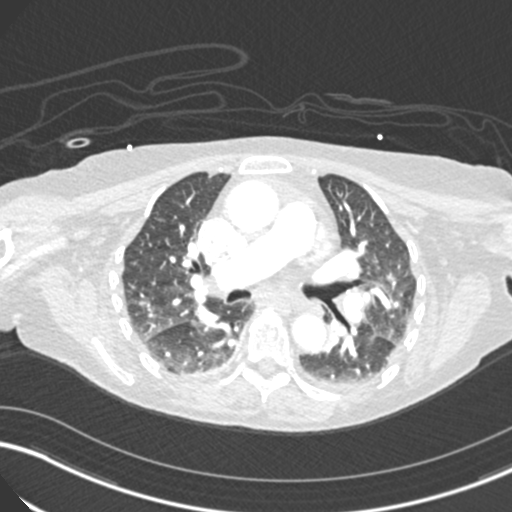
[im 123/212  mediastinal]
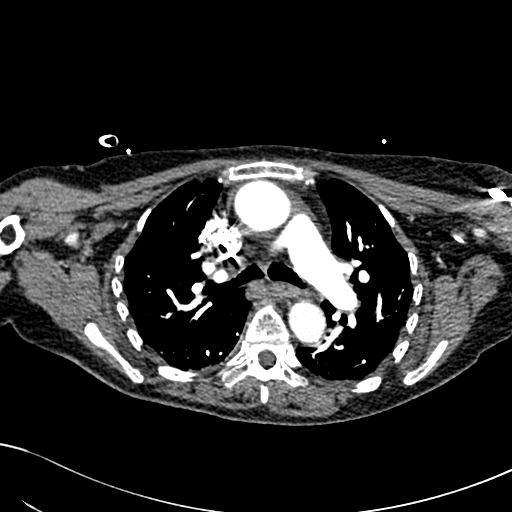
[im 134/212  lung]
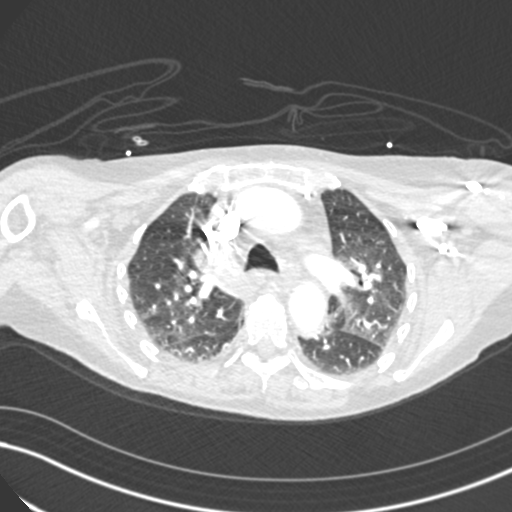
[im 145/212  mediastinal]
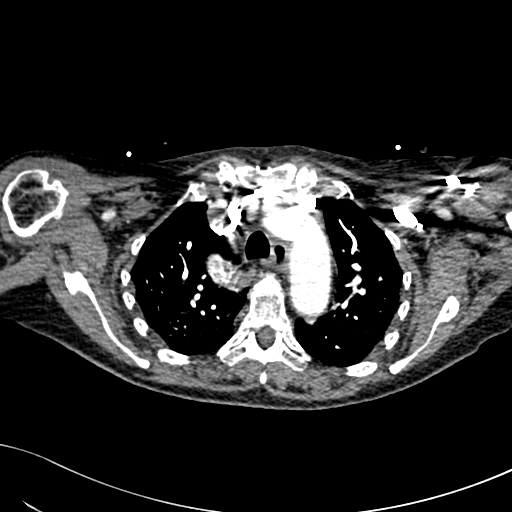
[im 156/212  lung]
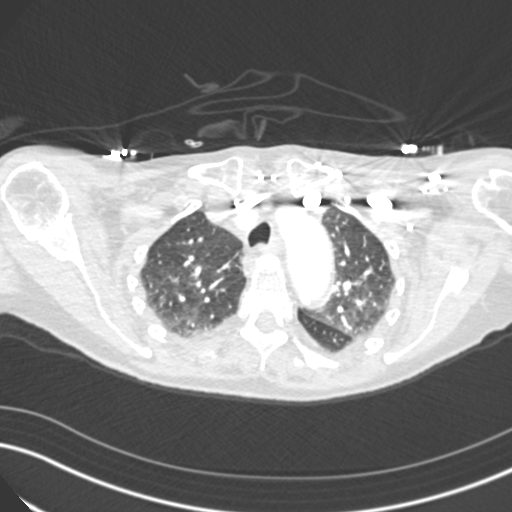
[im 167/212  mediastinal]
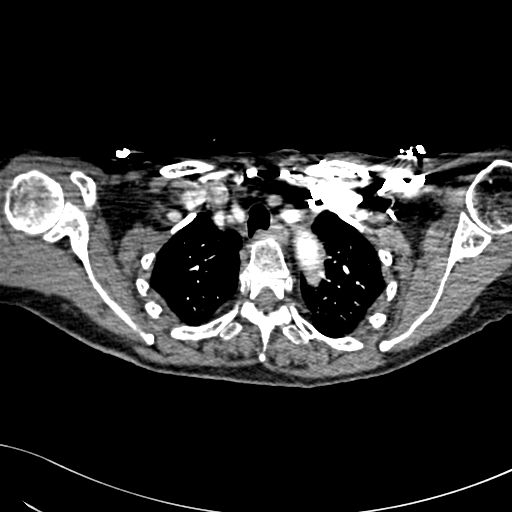
[im 178/212  lung]
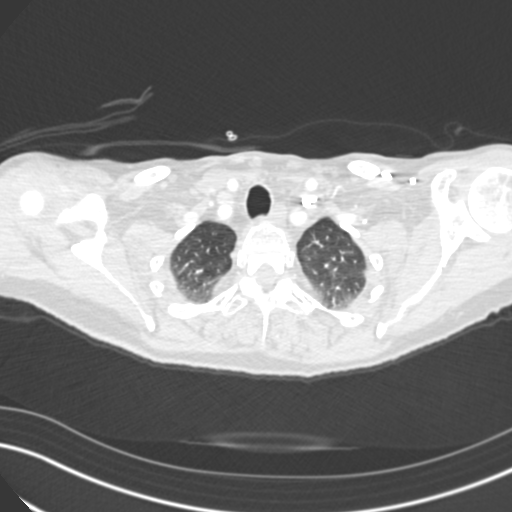
[im 189/212  mediastinal]
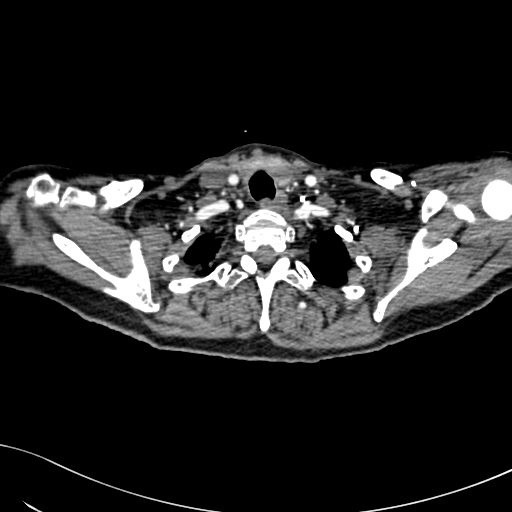
[im 200/212  lung]
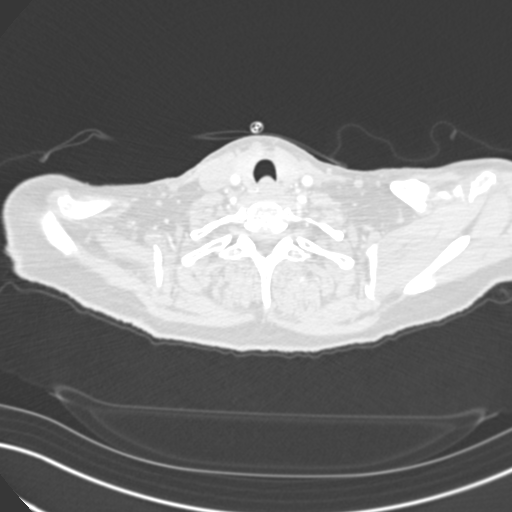

[Series 7: pe coronal mpr · coronal · 0.43mm/px · 1 of 105 slices shown]
[im 53/105  mediastinal]
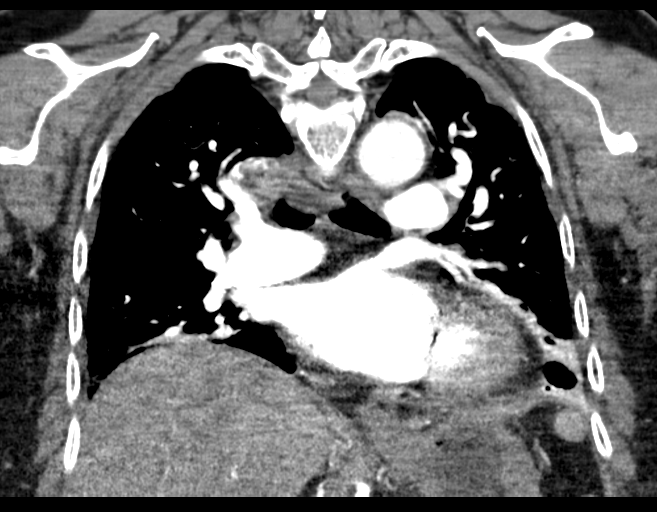

[18 of 36 positions shown; findings below may reference images not displayed]

RADIATION DOSE REDUCTION: This exam was performed according to the
departmental dose-optimization program which includes automated
exposure control, adjustment of the mA and/or kV according to
patient size and/or use of iterative reconstruction technique.

CONTRAST:  75mL OMNIPAQUE IOHEXOL 350 MG/ML SOLN
FINDINGS: Cardiovascular: Satisfactory opacification of the bilateral
pulmonary arteries to the lobar level. Evaluation of the lower lobes
is mildly constrained by superimposed pulmonary opacities. Within
that constraint, there is no evidence of pulmonary embolism.

Although not tailored for evaluation of the thoracic aorta, there is
no evidence thoracic aortic aneurysm or dissection. Mild
atherosclerotic calcifications of the arch.

The heart is normal in size.  No pericardial effusion.

Mediastinum/Nodes: No suspicious mediastinal lymphadenopathy.

Visualized thyroid is unremarkable.

Lungs/Pleura: Patchy bilateral lower lobe opacities, favoring
atelectasis, although superimposed mild pneumonia is possible. Mild
lingular atelectasis/pneumonia.

Additional mild dependent atelectasis in the posterior upper lobes.

No suspicious pulmonary nodules.

No pleural effusion or pneumothorax.

Upper Abdomen: 11 mm fluid density lesion in the posterior right
hepatic lobe (series 4/image 83), favoring a benign cyst. Splenosis
in the left upper abdomen.

Musculoskeletal: Degenerative changes of the visualized
thoracolumbar spine.

Review of the MIP images confirms the above findings.
IMPRESSION: No evidence of pulmonary embolism.

Patchy lingular and bilateral lower lobe opacities, favoring
atelectasis. Superimposed mild pneumonia is possible, although not
favored.

Aortic Atherosclerosis ([8S]-[8S]).

## 2021-09-07 MED ORDER — LOSARTAN POTASSIUM 50 MG PO TABS
100.0000 mg | ORAL_TABLET | Freq: Every day | ORAL | Status: DC
  Administered 2021-09-07 – 2021-09-10 (×4): 100 mg via ORAL
  Filled 2021-09-07 (×4): qty 2

## 2021-09-07 MED ORDER — IPRATROPIUM-ALBUTEROL 0.5-2.5 (3) MG/3ML IN SOLN: 3.0000 mL | Freq: Four times a day (QID) | RESPIRATORY_TRACT | Status: DC | PRN

## 2021-09-07 MED ORDER — SODIUM CHLORIDE 0.9 % IV SOLN
2.0000 g | INTRAVENOUS | Status: DC
  Administered 2021-09-08: 2 g via INTRAVENOUS
  Filled 2021-09-07: qty 20

## 2021-09-07 MED ORDER — HYDROMORPHONE HCL 1 MG/ML IJ SOLN
1.0000 mg | INTRAMUSCULAR | Status: AC | PRN
  Administered 2021-09-07 – 2021-09-08 (×4): 1 mg via INTRAVENOUS
  Filled 2021-09-07 (×4): qty 1

## 2021-09-07 MED ORDER — ISOTRETINOIN 40 MG PO CAPS
40.0000 mg | ORAL_CAPSULE | Freq: Two times a day (BID) | ORAL | Status: DC
Start: 2021-09-07 — End: 2021-09-10
  Administered 2021-09-08 – 2021-09-10 (×5): 40 mg via ORAL

## 2021-09-07 MED ORDER — ENOXAPARIN SODIUM 40 MG/0.4ML IJ SOSY
40.0000 mg | PREFILLED_SYRINGE | INTRAMUSCULAR | Status: DC
  Administered 2021-09-07 – 2021-09-09 (×3): 40 mg via SUBCUTANEOUS
  Filled 2021-09-07 (×3): qty 0.4

## 2021-09-07 MED ORDER — HYDROMORPHONE HCL 1 MG/ML IJ SOLN
1.0000 mg | Freq: Once | INTRAMUSCULAR | Status: AC
  Administered 2021-09-07: 1 mg via INTRAVENOUS
  Filled 2021-09-07: qty 1

## 2021-09-07 MED ORDER — SODIUM CHLORIDE 0.9 % IV SOLN
500.0000 mg | Freq: Once | INTRAVENOUS | Status: AC
  Administered 2021-09-07: 500 mg via INTRAVENOUS
  Filled 2021-09-07: qty 5

## 2021-09-07 MED ORDER — ACETAMINOPHEN 325 MG PO TABS: 650.0000 mg | ORAL_TABLET | Freq: Four times a day (QID) | ORAL | Status: DC | PRN

## 2021-09-07 MED ORDER — TRAZODONE HCL 50 MG PO TABS
50.0000 mg | ORAL_TABLET | Freq: Every day | ORAL | Status: DC
  Administered 2021-09-07 – 2021-09-09 (×3): 100 mg via ORAL
  Filled 2021-09-07 (×3): qty 2

## 2021-09-07 MED ORDER — LIP MEDEX EX OINT
TOPICAL_OINTMENT | CUTANEOUS | Status: DC | PRN
  Filled 2021-09-07: qty 7

## 2021-09-07 MED ORDER — SODIUM CHLORIDE 0.9 % IV SOLN
1.0000 g | Freq: Once | INTRAVENOUS | Status: AC
  Administered 2021-09-07: 1 g via INTRAVENOUS
  Filled 2021-09-07: qty 10

## 2021-09-07 MED ORDER — VALACYCLOVIR HCL 500 MG PO TABS
500.0000 mg | ORAL_TABLET | Freq: Every day | ORAL | Status: DC
  Administered 2021-09-07 – 2021-09-10 (×4): 500 mg via ORAL
  Filled 2021-09-07 (×4): qty 1

## 2021-09-07 MED ORDER — IOHEXOL 350 MG/ML SOLN
75.0000 mL | Freq: Once | INTRAVENOUS | Status: AC | PRN
Start: 2021-09-07 — End: 2021-09-07
  Administered 2021-09-07: 75 mL via INTRAVENOUS

## 2021-09-07 MED ORDER — AMLODIPINE BESYLATE 5 MG PO TABS
5.0000 mg | ORAL_TABLET | Freq: Every day | ORAL | Status: DC
  Administered 2021-09-07 – 2021-09-10 (×4): 5 mg via ORAL
  Filled 2021-09-07 (×4): qty 1

## 2021-09-07 MED ORDER — PREGABALIN 75 MG PO CAPS
150.0000 mg | ORAL_CAPSULE | Freq: Two times a day (BID) | ORAL | Status: DC
  Administered 2021-09-07 – 2021-09-10 (×6): 150 mg via ORAL
  Filled 2021-09-07 (×7): qty 2

## 2021-09-07 MED ORDER — HYDROMORPHONE HCL 1 MG/ML IJ SOLN
1.0000 mg | INTRAMUSCULAR | Status: DC | PRN
  Administered 2021-09-07 (×3): 1 mg via INTRAVENOUS
  Filled 2021-09-07 (×3): qty 1

## 2021-09-07 MED ORDER — OXYCODONE HCL 5 MG PO TABS
5.0000 mg | ORAL_TABLET | ORAL | Status: DC | PRN
  Administered 2021-09-07 – 2021-09-09 (×4): 5 mg via ORAL
  Filled 2021-09-07 (×4): qty 1

## 2021-09-07 MED ORDER — DULOXETINE HCL 30 MG PO CPEP
60.0000 mg | ORAL_CAPSULE | Freq: Every day | ORAL | Status: DC
  Administered 2021-09-07 – 2021-09-09 (×3): 60 mg via ORAL
  Filled 2021-09-07 (×3): qty 2

## 2021-09-07 MED ORDER — ZINC SULFATE 220 (50 ZN) MG PO CAPS
220.0000 mg | ORAL_CAPSULE | Freq: Every day | ORAL | Status: DC
  Administered 2021-09-07 – 2021-09-10 (×4): 220 mg via ORAL
  Filled 2021-09-07 (×4): qty 1

## 2021-09-07 MED ORDER — ACETAMINOPHEN 650 MG RE SUPP: 650.0000 mg | Freq: Four times a day (QID) | RECTAL | Status: DC | PRN

## 2021-09-07 MED ORDER — SODIUM CHLORIDE 0.9 % IV SOLN
500.0000 mg | INTRAVENOUS | Status: DC
  Administered 2021-09-08 – 2021-09-09 (×2): 500 mg via INTRAVENOUS
  Filled 2021-09-07 (×2): qty 5

## 2021-09-07 MED ORDER — ONDANSETRON HCL 4 MG/2ML IJ SOLN
4.0000 mg | Freq: Three times a day (TID) | INTRAMUSCULAR | Status: DC | PRN
  Administered 2021-09-07 – 2021-09-09 (×2): 4 mg via INTRAVENOUS
  Filled 2021-09-07 (×2): qty 2

## 2021-09-07 NOTE — ED Notes (Signed)
ED TO INPATIENT HANDOFF REPORT ? ?ED Nurse Name and Phone #: Dorian Pod Kleber Crean (epic dm please) ? ?S ?Name/Age/Gender ?Carolyn Mckinney ?76 y.o. ?female ?Room/Bed: MH03/MH03 ? ?Code Status ?  Code Status: Not on file ? ?Home/SNF/Other ?Home ?Patient oriented to: self, place, time, and situation ?Is this baseline? Yes  ? ?Triage Complete: Triage complete  ?Chief Complaint ?CAP (community acquired pneumonia) [J18.9] ? ?Triage Note ?Pt state lower back/side pain on right sided that's gotten worse last few days. Has Hx of neuropathy.   ? ?Allergies ?No Known Allergies ? ?Level of Care/Admitting Diagnosis ?ED Disposition   ? ? ED Disposition  ?Admit  ? Condition  ?--  ? Comment  ?Hospital Area: [facility] [[id number]] ? Level of Care: Telemetry [5] ? Admit to tele based on following criteria: Complex arrhythmia (Bradycardia/Tachycardia) ? May admit patient to [facility] or [facility] if equivalent level of care is available:: Yes ? Interfacility transfer: Yes ? Covid Evaluation: Asymptomatic - no recent exposure (last 10 days) testing not required ? Diagnosis: CAP (community acquired pneumonia) [453646] ? Admitting Physician: Shela Leff [[id number]] ? Attending Physician: Shela Leff [[id number]] ? Estimated length of stay: past midnight tomorrow ? Certification:: I certify this patient will need inpatient services for at least 2 midnights ?  ?  ? ?  ? ? ?B ?Medical/Surgery History ?Past Medical History:  ?Diagnosis Date  ? Allergy   ? Cancer [facility])   ? Breast  ? Cataract   ? cataract repair bilaterally  ? Closed patellar sleeve fracture of right knee 05/2019  ? Hypertension   ? Meniere's disease   ? Personal history of radiation therapy 2001  ? Left Breast Cancer  ? ?Past Surgical History:  ?Procedure Laterality Date  ? BREAST BIOPSY Left 2001  ? BREAST LUMPECTOMY Left 01/25/2000  ? COLONOSCOPY  2006  ? VAGINAL HYSTERECTOMY  1991  ? Done for bleeding. Benign pathology  ?  ? ?A ?IV  Location/Drains/Wounds ?Patient Lines/Drains/Airways Status   ? ? Active Line/Drains/Airways   ? ? Name Placement date Placement time Site Days  ? Peripheral IV 09/06/21 20 G Left Antecubital 09/06/21  2322  Antecubital  1  ? ?  ?  ? ?  ? ? ?Intake/Output Last 24 hours ? ?Intake/Output Summary (Last 24 hours) at 09/07/2021 1112 ?Last data filed at 09/07/2021 0553 ?Gross per 24 hour  ?Intake 350.63 ml  ?Output --  ?Net 350.63 ml  ? ? ?Labs/Imaging ?Results for orders placed or performed during the hospital encounter of 09/06/21 (from the past 48 hour(s))  ?Lipase, blood     Status: None  ? Collection Time: 09/06/21 11:00 PM  ?Result Value Ref Range  ? Lipase 23 11 - 51 U/L  ?  Comment: Performed at [facility], [address], [place], [place] [address]  ?Comprehensive metabolic panel     Status: Abnormal  ? Collection Time: 09/06/21 11:00 PM  ?Result Value Ref Range  ? Sodium 131 (L) 135 - 145 mmol/L  ? Potassium 4.1 3.5 - 5.1 mmol/L  ? Chloride 98 98 - 111 mmol/L  ? CO2 26 22 - 32 mmol/L  ? Glucose, Bld 105 (H) 70 - 99 mg/dL  ?  Comment: Glucose reference range applies only to samples taken after fasting for at least 8 hours.  ? BUN 12 8 - 23 mg/dL  ? Creatinine, Ser 0.63 0.44 - 1.00 mg/dL  ? Calcium 8.7 (L) 8.9 - 10.3 mg/dL  ? Total  Protein 7.0 6.5 - 8.1 g/dL  ? Albumin 3.4 (L) 3.5 - 5.0 g/dL  ? AST 27 15 - 41 U/L  ? ALT 19 0 - 44 U/L  ? Alkaline Phosphatase 69 38 - 126 U/L  ? Total Bilirubin 1.0 0.3 - 1.2 mg/dL  ? GFR, Estimated >60 >60 mL/min  ?  Comment: (NOTE) ?Calculated using the CKD-EPI Creatinine Equation (2021) ?  ? Anion gap 7 5 - 15  ?  Comment: Performed at [facility], [address], [place], [place] [address]  ?CBC with Differential/Platelet     Status: Abnormal  ? Collection Time: 09/06/21 11:00 PM  ?Result Value Ref Range  ? WBC 10.8 (H) 4.0 - 10.5 K/uL  ? RBC 3.81 (L) 3.87 - 5.11 MIL/uL  ? Hemoglobin 12.8 12.0 - 15.0 g/dL  ? HCT 36.8 36.0 - 46.0 %  ? MCV 96.6 80.0 - 100.0 fL   ? MCH 33.6 26.0 - 34.0 pg  ? MCHC 34.8 30.0 - 36.0 g/dL  ? RDW 12.7 11.5 - 15.5 %  ? Platelets 278 150 - 400 K/uL  ? nRBC 0.0 0.0 - 0.2 %  ? Neutrophils Relative % 71 %  ? Neutro Abs 7.7 1.7 - 7.7 K/uL  ? Lymphocytes Relative 13 %  ? Lymphs Abs 1.4 0.7 - 4.0 K/uL  ? Monocytes Relative 14 %  ? Monocytes Absolute 1.5 (H) 0.1 - 1.0 K/uL  ? Eosinophils Relative 1 %  ? Eosinophils Absolute 0.1 0.0 - 0.5 K/uL  ? Basophils Relative 1 %  ? Basophils Absolute 0.1 0.0 - 0.1 K/uL  ? Immature Granulocytes 0 %  ? Abs Immature Granulocytes 0.03 0.00 - 0.07 K/uL  ?  Comment: Performed at [facility], [address], [place], [place] [address]  ?D-dimer, quantitative     Status: Abnormal  ? Collection Time: 09/07/21 12:20 AM  ?Result Value Ref Range  ? D-Dimer, Quant 2.00 (H) 0.00 - 0.50 ug/mL-FEU  ?  Comment: (NOTE) ?At the manufacturer cut-off value of 0.5 ?g/mL FEU, this assay has a ?negative predictive value of 95-100%.This assay is intended for use ?in conjunction with a clinical pretest probability (PTP) assessment ?model to exclude pulmonary embolism (PE) and deep venous thrombosis ?(DVT) in outpatients suspected of PE or DVT. ?Results should be correlated with clinical presentation. ?Performed at [facility], [address] [place], [place] [address] ?  ?Resp Panel by RT-PCR (Flu A&B, Covid) Nasopharyngeal Swab     Status: None  ? Collection Time: 09/07/21 12:21 AM  ? Specimen: Nasopharyngeal Swab; Nasopharyngeal(NP) swabs in vial transport medium  ?Result Value Ref Range  ? SARS Coronavirus 2 by RT PCR NEGATIVE NEGATIVE  ?  Comment: (NOTE) ?SARS-CoV-2 target nucleic acids are NOT DETECTED. ? ?The SARS-CoV-2 RNA is generally detectable in upper respiratory ?specimens during the acute phase of infection. The lowest ?concentration of SARS-CoV-2 viral copies this assay can detect is ?138 copies/mL. A negative result does not preclude SARS-Cov-2 ?infection and should not be used as the sole basis for  treatment or ?other patient management decisions. A negative result may occur with  ?improper specimen collection/handling, submission of specimen other ?than nasopharyngeal swab, presence of viral mutation(s) within the ?areas targeted by this assay, and inadequate number of viral ?copies(<138 copies/mL). A negative result must be combined with ?clinical observations, patient history, and epidemiological ?information. The expected result is Negative. ? ?Fact Sheet for Patients:  ?EntrepreneurPulse.com.au ? ?Fact Sheet  for Healthcare Providers:  ?IncredibleEmployment.be ? ?This test is no t yet approved or cleared by the [place] FDA and  ?has been authorized for detection and/or diagnosis of SARS-CoV-2 by ?FDA under an Emergency Use Authorization (EUA). This EUA will remain  ?in effect (meaning this test can be used) for the duration of the ?COVID-19 declaration under Section 564(b)(1) of the Act, 21 ?U.S.C.section 360bbb-3(b)(1), unless the authorization is terminated  ?or revoked sooner.  ? ? ?  ? Influenza A by PCR NEGATIVE NEGATIVE  ? Influenza B by PCR NEGATIVE NEGATIVE  ?  Comment: (NOTE) ?The Xpert Xpress SARS-CoV-2/FLU/RSV plus assay is intended as an aid ?in the diagnosis of influenza from Nasopharyngeal swab specimens and ?should not be used as a sole basis for treatment. Nasal washings and ?aspirates are unacceptable for Xpert Xpress SARS-CoV-2/FLU/RSV ?testing. ? ?Fact Sheet for Patients: ?EntrepreneurPulse.com.au ? ?Fact Sheet for Healthcare Providers: ?IncredibleEmployment.be ? ?This test is not yet approved or cleared by the [place] FDA and ?has been authorized for detection and/or diagnosis of SARS-CoV-2 by ?FDA under an Emergency Use Authorization (EUA). This EUA will remain ?in effect (meaning this test can be used) for the duration of the ?COVID-19 declaration under Section 564(b)(1) of the Act, 21 U.S.C. ?section  360bbb-3(b)(1), unless the authorization is terminated or ?revoked. ? ?Performed at [facility], [address] [place], [place] [address] ?  ?Urinalysis, Routine w reflex microscopic Urine, Clean Catch

## 2021-09-07 NOTE — ED Notes (Signed)
Report given to [facility] 

## 2021-09-07 NOTE — ED Notes (Signed)
Resumed care from Delrae Alfred, RN with bedside report. Pain assessed, pt repositioned. Pt O2 sat dropped into upper 80's while repositioning; O2 delivery via [place] increased to 3L. Pt is mouth-breathing; encouraged pt to breathe in through her nose as much as possible. Pt sleepy but able to answer questions and participate in her care. NAD noted.  ?

## 2021-09-07 NOTE — ED Notes (Signed)
Patient transported to CT 

## 2021-09-07 NOTE — Plan of Care (Signed)
[facility] will assume care on arrival to accepting facility. Until arrival, care as per EDP. However, [facility] available 24/7 for questions and assistance.  Nursing staff, please page [facility] Admits and Consults ([phone]) as soon as the patient arrives to the hospital.   

## 2021-09-07 NOTE — Telephone Encounter (Signed)
Nurse Assessment ?Nurse: Ellery Plunk, RN, Danica Date/Time (Eastern Time): 09/06/2021 4:34:31 PM ?Confirm and document reason for call. If ?symptomatic, describe symptoms. ?---Caller states mother is having severe flank pain on ?lower right back that started last week. patient states ?"it could be worse" but seems pretty high. Has talked ?to provider about this pain. Is taking isotretinoin. Did ?a blood test a week and a half ago and it showed she ?needed to stop taking ibuprofen and pain has gotten ?worse since then. ?Does the patient have any new or worsening ?symptoms? ---Yes ?Will a triage be completed? ---Yes ?Related visit to physician within the last 2 weeks? ---Yes ?Does the PT have any chronic conditions? (i.e. ?diabetes, asthma, this includes High risk factors for ?pregnancy, etc.) ?---No ?Is this a behavioral health or substance abuse call? ---No ?Guidelines ?Guideline Title Affirmed Question Affirmed Notes Nurse Date/Time (Eastern ?Time) ?Flank Pain [1] SEVERE pain ?(e.g., excruciating, ?scale 8-10) AND [2] ?present > 1 hour ?Bringas, RN, Athalia 09/06/2021 4:37:12 ?PM ?PLEASE NOTE: All timestamps contained within this report are represented as [place] Standard Time. ?CONFIDENTIALTY NOTICE: This fax transmission is intended only for the addressee. It contains information that is legally privileged, confidential or ?otherwise protected from use or disclosure. If you are not the intended recipient, you are strictly prohibited from reviewing, disclosing, copying using ?or disseminating any of this information or taking any action in reliance on or regarding this information. If you have received this fax in error, please ?notify us immediately by telephone so that we can arrange for its return to [place]. Phone: [phone], Toll-Free: [phone], Fax: [phone] ?Page: 2 of 2 ?Call Id: [id number] ?Disp. Time (Eastern ?Time) Disposition Final User ?09/06/2021 4:32:43 PM Send to Urgent Lucie Leather, Marisue [place] ?09/06/2021  4:39:33 PM Go to ED Now Yes Bringas, RN, Danica ?Caller Disagree/Comply Comply ?Caller Understands Yes ?PreDisposition Go to ED ?Care Advice Given Per Guideline ?GO TO ED NOW: * You need to be seen in the Emergency Department. * Leave now. Drive carefully. CARE ADVICE given per ?Flank Pain (Adult) guideline. ANOTHER ADULT SHOULD DRIVE: * It is better and safer if another adult drives instead of you. ?Referrals ?[place] - ED ?

## 2021-09-07 NOTE — H&P (Signed)
?History and Physical  ? ? ?Patient: Carolyn Mckinney VOH:[id number] DOB: Sep 12, 1945 ?DOA: 09/06/2021 ?DOS: the patient was seen and examined on 09/07/2021 ?PCP: Shelda Pal, DO  ?Patient coming from: Home ? ?Chief Complaint:  ?Chief Complaint  ?Patient presents with  ? Back Pain  ? Flank Pain  ? Hip Pain  ? ?HPI: Carolyn Mckinney is a 76 y.o. 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 Florina Ou note from earlier today).  She endorses being recently weak and dyspneic.  She has not been able to exercise like usual in recent days.  She has been coughing.  She has had some night sweats, but denied fever and chills, no significant changes in appetite.  She has been in significant amounts of pain since stopping ibuprofen per her dermatologist request while using isotretinoin due to are normal liver function tests.  No nausea, emesis, diarrhea, constipation, melena or hematochezia.  Positive musculoskeletal flank pain, but no dysuria, frequency or hematuria.  No polyuria, polydipsia, polyphagia or blurred vision. ? ?ED course: Initial vital signs were temperature 99.4 ?F, pulse 86, respiration 18, BP 137/79 mmHg O2 sat 100% on room air.  The patient received azithromycin 500 mg IVPB, ceftriaxone 1 g IVPB and 500 mL of normal saline bolus. ? ?Lab work: Urinalysis with ketonuria of 15 and trace leukocyte esterase.  Microscopic semination shows rare bacteria.  D-dimer 2.00.  CBC showed a white count of 10.8, hemoglobin 12.8 g/dL platelets 278.  Lipase was normal.  CMP with a sodium 131 mmol/L, but all other electrolytes were normal after calcium correction.  Glucose 105 mg/dL and albumin 3.4 g/dL.  Renal function and the rest of the hepatic tests are  normal. ? ?Imaging: No active disease.  CTA chest showed no evidence of PE, but there was patchy lingular and bilateral lower lobe opacities, favoring atelectasis.  Superimposed mild pneumonia is possible, although not favored.  Aortic atherosclerosis. ?  ?Review of Systems: As mentioned in the history of present illness. All other systems reviewed and are negative. ?Past Medical History:  ?Diagnosis Date  ? Allergy   ? Cancer [facility])   ? Breast  ? Cataract   ? cataract repair bilaterally  ? Closed patellar sleeve fracture of right knee 05/2019  ? Hypertension   ? Meniere's disease   ? Personal history of radiation therapy 2001  ? Left Breast Cancer  ? ?Past Surgical History:  ?Procedure Laterality Date  ? BREAST BIOPSY Left 2001  ? BREAST LUMPECTOMY Left 01/25/2000  ? COLONOSCOPY  2006  ? VAGINAL HYSTERECTOMY  1991  ? Done for bleeding. Benign pathology  ? ?Social History:  reports that she has never smoked. She has never used smokeless tobacco. She reports current alcohol use. She reports that she does not use drugs. ? ?No Known Allergies ? ?Family History  ?Problem Relation Age of Onset  ? Arthritis Mother   ? Heart disease Father   ? Colon cancer Neg Hx   ? Colon polyps Neg Hx   ? Esophageal cancer Neg Hx   ? Rectal cancer Neg Hx   ? Stomach cancer Neg Hx   ? ? ?Prior to Admission medications   ?Medication Sig Start Date End Date Taking? Authorizing Provider  ?HYDROcodone-acetaminophen (NORCO/VICODIN) 5-325 MG tablet  Take 1 tablet by mouth every 6 (six) hours as needed for moderate pain. 09/06/21  Yes Fredia Sorrow, MD  ?ondansetron (ZOFRAN-ODT) 4 MG disintegrating tablet Take 1 tablet (4 mg total) by mouth every 8 (eight) hours as needed. 09/06/21  Yes Fredia Sorrow, MD  ?amLODipine (NORVASC) 5 MG tablet Take 1 tablet by mouth once daily 07/19/21   Shelda Pal, DO  ?Calcium Carb-Cholecalciferol (CALCIUM + D3 PO) Take by mouth.    [provider]  ?COVID-19 mRNA bivalent vaccine, Pfizer,  (PFIZER COVID-19 VAC BIVALENT) injection Inject into the muscle. 05/03/21   Carlyle Basques, MD  ?Diclofenac Sodium (PENNSAID) 2 % SOLN Apply 2 pumps BID to the affected area 06/02/20   Rosemarie Ax, MD  ?DULoxetine (CYMBALTA) 60 MG capsule Take 1 capsule (60 mg total) by mouth daily. 07/18/21   Alda Berthold, DO  ?Ferrous Sulfate (IRON PO) Take by mouth.    [provider]  ?losartan (COZAAR) 100 MG tablet Take 1 tablet (100 mg total) by mouth daily. 01/04/21   Shelda Pal, DO  ?Multiple Vitamin (MULTIVITAMIN ADULT PO) Take by mouth.    [provider]  ?pregabalin (LYRICA) 150 MG capsule Take 1 capsule by mouth twice daily 06/21/21   Shelda Pal, DO  ?traZODone (DESYREL) 50 MG tablet TAKE 1 TO 2 TABLETS BY MOUTH AT BEDTIME AS NEEDED FOR SLEEP 07/19/21   Wendling, Crosby Oyster, DO  ?valACYclovir (VALTREX) 500 MG tablet TAKE 4 TABLETS BY MOUTH AT ONSET OF BREAKOUT AND REPEAT IN 12 HOURS. OTHERWISE, TAKE 1 TABLET BY MOUTH ONCE DAILY 05/24/21   Shelda Pal, DO  ?VITAMIN K PO Take by mouth.    [provider]  ?Zoster Vaccine Adjuvanted [facility]) injection Inject into the muscle. 05/03/21   Carlyle Basques, MD  ? ? ?Physical Exam: ?Vitals:  ? 09/07/21 1200 09/07/21 1228 09/07/21 1230 09/07/21 1345  ?BP: 98/69  110/68 117/68  ?Pulse: 84 78 83 84  ?Resp: (!) 21 17 (!) 23 18  ?Temp:    98.4 ?F (36.9 ?C)  ?TempSrc:    Oral  ?SpO2: 91% 92% 98% 97%  ?Weight:      ?Height:      ? ?Physical Exam ?Vitals reviewed.  ?Constitutional:   ?   Appearance: She is ill-appearing.  ?   Interventions: Nasal cannula in place.  ?HENT:  ?   Head: Normocephalic.  ?   Mouth/Throat:  ?   Mouth: Mucous membranes are dry.  ?Eyes:  ?   General: No scleral icterus. ?   Pupils: Pupils are equal, round, and reactive to light.  ?Neck:  ?   Vascular: No JVD.  ?Cardiovascular:  ?   Rate and Rhythm: Normal rate and regular rhythm.  ?   Heart sounds: S1 normal and S2 normal.  ?Pulmonary:  ?    Effort: Tachypnea present.  ?   Breath sounds: No stridor. Examination of the right-lower field reveals decreased breath sounds. Examination of the left-lower field reveals decreased breath sounds. Decreased breath sounds and rales present. No wheezing or rhonchi.  ?Abdominal:  ?   General: Bowel sounds are normal. There is no distension.  ?   Palpations: Abdomen is soft.  ?   Tenderness: There is no abdominal tenderness. There is no right CVA tenderness, left CVA tenderness or guarding.  ?Musculoskeletal:  ?   Cervical back: Neck supple.  ?   Lumbar back: Spasms and tenderness present. Decreased range of motion.  ?  Right lower leg: No edema.  ?   Left lower leg: No edema.  ?Skin: ?   General: Skin is warm and dry.  ?Neurological:  ?   General: No focal deficit present.  ?   Mental Status: She is alert and oriented to person, place, and time.  ?Psychiatric:     ?   Mood and Affect: Mood normal.     ?   Behavior: Behavior normal.  ? ? ?Data Reviewed: ? ?There are no new results to review at this time. ? ?Assessment and Plan: ?Principal Problem: ?  Acute respiratory failure with hypoxia ([place]) ?In the setting of: ?  CAP (community acquired pneumonia) ?Admit to telemetry/inpatient. ?Continue supplemental oxygen. ?As needed bronchodilators. ?Continue ceftriaxone 1 g IVPB daily. ?Continue azithromycin 500 mg IVPB daily. ?Check strep pneumoniae urinary antigen. ?Check sputum Gram stain, culture and sensitivity. ?Follow-up blood culture and sensitivity. ?Follow-up CBC and chemistry in the morning. ? ? ?Active Problems: ?  Essential hypertension ?Continue losartan 100 mg p.o. daily. ?Continue amlodipine 5 mg p.o. daily. ?Monitor BP, HR, renal function electrolytes. ? ?  Lumbar radiculopathy ?Oxycodone as needed for breakthrough pain. ?Hydromorphone as needed for refractory severe pain. ?Continue duloxetine and pregabalin. ?Consult physical therapy in the morning. ?Consider MRI imaging if no improvement. ? ? ? ? Advance  Care Planning:   Code Status: Not on file  ? ?Consults:  ? ?Family Communication: Her daughter was present in her room. ? ?Severity of Illness: ?The appropriate patient status for this patient is OBSERVAT

## 2021-09-07 NOTE — ED Provider Notes (Signed)
Nursing notes and vitals signs, including pulse oximetry, reviewed. ? ?Summary of this visit's results, reviewed by myself: ? ?EKG: ? EKG Interpretation ? ?Date/Time:    ?Ventricular Rate:    ?PR Interval:    ?QRS Duration:   ?QT Interval:    ?QTC Calculation:   ?R Axis:     ?Text Interpretation:   ?  ? ?  ? ? ?Labs:  ?Results for orders placed or performed during the hospital encounter of 09/06/21 (from the past 24 hour(s))  ?Lipase, blood     Status: None  ? Collection Time: 09/06/21 11:00 PM  ?Result Value Ref Range  ? Lipase 23 11 - 51 U/L  ?Comprehensive metabolic panel     Status: Abnormal  ? Collection Time: 09/06/21 11:00 PM  ?Result Value Ref Range  ? Sodium 131 (L) 135 - 145 mmol/L  ? Potassium 4.1 3.5 - 5.1 mmol/L  ? Chloride 98 98 - 111 mmol/L  ? CO2 26 22 - 32 mmol/L  ? Glucose, Bld 105 (H) 70 - 99 mg/dL  ? BUN 12 8 - 23 mg/dL  ? Creatinine, Ser 0.63 0.44 - 1.00 mg/dL  ? Calcium 8.7 (L) 8.9 - 10.3 mg/dL  ? Total Protein 7.0 6.5 - 8.1 g/dL  ? Albumin 3.4 (L) 3.5 - 5.0 g/dL  ? AST 27 15 - 41 U/L  ? ALT 19 0 - 44 U/L  ? Alkaline Phosphatase 69 38 - 126 U/L  ? Total Bilirubin 1.0 0.3 - 1.2 mg/dL  ? GFR, Estimated >60 >60 mL/min  ? Anion gap 7 5 - 15  ?CBC with Differential/Platelet     Status: Abnormal  ? Collection Time: 09/06/21 11:00 PM  ?Result Value Ref Range  ? WBC 10.8 (H) 4.0 - 10.5 K/uL  ? RBC 3.81 (L) 3.87 - 5.11 MIL/uL  ? Hemoglobin 12.8 12.0 - 15.0 g/dL  ? HCT 36.8 36.0 - 46.0 %  ? MCV 96.6 80.0 - 100.0 fL  ? MCH 33.6 26.0 - 34.0 pg  ? MCHC 34.8 30.0 - 36.0 g/dL  ? RDW 12.7 11.5 - 15.5 %  ? Platelets 278 150 - 400 K/uL  ? nRBC 0.0 0.0 - 0.2 %  ? Neutrophils Relative % 71 %  ? Neutro Abs 7.7 1.7 - 7.7 K/uL  ? Lymphocytes Relative 13 %  ? Lymphs Abs 1.4 0.7 - 4.0 K/uL  ? Monocytes Relative 14 %  ? Monocytes Absolute 1.5 (H) 0.1 - 1.0 K/uL  ? Eosinophils Relative 1 %  ? Eosinophils Absolute 0.1 0.0 - 0.5 K/uL  ? Basophils Relative 1 %  ? Basophils Absolute 0.1 0.0 - 0.1 K/uL  ? Immature  Granulocytes 0 %  ? Abs Immature Granulocytes 0.03 0.00 - 0.07 K/uL  ?D-dimer, quantitative     Status: Abnormal  ? Collection Time: 09/07/21 12:20 AM  ?Result Value Ref Range  ? D-Dimer, Quant 2.00 (H) 0.00 - 0.50 ug/mL-FEU  ?Resp Panel by RT-PCR (Flu A&B, Covid) Nasopharyngeal Swab     Status: None  ? Collection Time: 09/07/21 12:21 AM  ? Specimen: Nasopharyngeal Swab; Nasopharyngeal(NP) swabs in vial transport medium  ?Result Value Ref Range  ? SARS Coronavirus 2 by RT PCR NEGATIVE NEGATIVE  ? Influenza A by PCR NEGATIVE NEGATIVE  ? Influenza B by PCR NEGATIVE NEGATIVE  ?Urinalysis, Routine w reflex microscopic Urine, Clean Catch     Status: Abnormal  ? Collection Time: 09/07/21 12:55 AM  ?Result Value Ref Range  ? Color, Urine YELLOW  YELLOW  ? APPearance CLEAR CLEAR  ? Specific Gravity, Urine 1.015 1.005 - 1.030  ? pH 7.0 5.0 - 8.0  ? Glucose, UA NEGATIVE NEGATIVE mg/dL  ? Hgb urine dipstick NEGATIVE NEGATIVE  ? Bilirubin Urine NEGATIVE NEGATIVE  ? Ketones, ur 15 (A) NEGATIVE mg/dL  ? Protein, ur NEGATIVE NEGATIVE mg/dL  ? Nitrite NEGATIVE NEGATIVE  ? Leukocytes,Ua TRACE (A) NEGATIVE  ?Urinalysis, Microscopic (reflex)     Status: Abnormal  ? Collection Time: 09/07/21 12:55 AM  ?Result Value Ref Range  ? RBC / HPF 0-5 0 - 5 RBC/hpf  ? WBC, UA 0-5 0 - 5 WBC/hpf  ? Bacteria, UA RARE (A) NONE SEEN  ? Squamous Epithelial / LPF 0-5 0 - 5  ? Non Squamous Epithelial PRESENT (A) NONE SEEN  ? Mucus PRESENT   ? ? ?Imaging Studies: ?CT Angio Chest PE W and/or Wo Contrast ? ?Result Date: 09/07/2021 ?CLINICAL DATA:  Chest/back pain, positive D-dimer. History of breast cancer. EXAM: CT ANGIOGRAPHY CHEST WITH CONTRAST TECHNIQUE: Multidetector CT imaging of the chest was performed using the standard protocol during bolus administration of intravenous contrast. Multiplanar CT image reconstructions and MIPs were obtained to evaluate the vascular anatomy. RADIATION DOSE REDUCTION: This exam was performed according to the  departmental dose-optimization program which includes automated exposure control, adjustment of the mA and/or kV according to patient size and/or use of iterative reconstruction technique. CONTRAST:  23m OMNIPAQUE IOHEXOL 350 MG/ML SOLN COMPARISON:  Chest radiograph dated 09/07/2021 FINDINGS: Cardiovascular: Satisfactory opacification of the bilateral pulmonary arteries to the lobar level. Evaluation of the lower lobes is mildly constrained by superimposed pulmonary opacities. Within that constraint, there is no evidence of pulmonary embolism. Although not tailored for evaluation of the thoracic aorta, there is no evidence thoracic aortic aneurysm or dissection. Mild atherosclerotic calcifications of the arch. The heart is normal in size.  No pericardial effusion. Mediastinum/Nodes: No suspicious mediastinal lymphadenopathy. Visualized thyroid is unremarkable. Lungs/Pleura: Patchy bilateral lower lobe opacities, favoring atelectasis, although superimposed mild pneumonia is possible. Mild lingular atelectasis/pneumonia. Additional mild dependent atelectasis in the posterior upper lobes. No suspicious pulmonary nodules. No pleural effusion or pneumothorax. Upper Abdomen: 11 mm fluid density lesion in the posterior right hepatic lobe (series 4/image 83), favoring a benign cyst. Splenosis in the left upper abdomen. Musculoskeletal: Degenerative changes of the visualized thoracolumbar spine. Review of the MIP images confirms the above findings. IMPRESSION: No evidence of pulmonary embolism. Patchy lingular and bilateral lower lobe opacities, favoring atelectasis. Superimposed mild pneumonia is possible, although not favored. Aortic Atherosclerosis (ICD10-I70.0). Electronically Signed   By: SJulian HyM.D.   On: 09/07/2021 02:17  ? ?DG Chest Port 1 View ? ?Result Date: 09/07/2021 ?CLINICAL DATA:  Back pain. EXAM: PORTABLE CHEST 1 VIEW COMPARISON:  None Available. FINDINGS: Diffuse chronic interstitial coarsening.  There are bibasilar atelectasis or scarring. No focal consolidation, pleural effusion, pneumothorax. Top-normal cardiac silhouette. Osteopenia with degenerative changes of the spine. No acute osseous pathology. IMPRESSION: No active disease. Electronically Signed   By: AAnner CreteM.D.   On: 09/07/2021 00:35   ? ?2:56 AM ?Patient's oxygen saturation dropped to 84% on room air even while she was awake and talking.  Her oxygen comes back up with supplemental oxygen by nasal cannula.  The cause of her hypoxia is unclear and we will have her admitted for further evaluation.  Dr. ZRogene Houstonnotes that she was hypoxic even before being given any narcotic medication. ? ?3:22 AM ?Dr. RMarlowe Saxaccepts for admission  to hospitalist service.  She requests we start antibiotics for possible pneumonia. ?  ?Shanon Rosser, MD ?09/07/21 1504 ? ?

## 2021-09-07 NOTE — Plan of Care (Signed)

## 2021-09-08 DIAGNOSIS — J189 Pneumonia, unspecified organism: Secondary | ICD-10-CM | POA: Diagnosis not present

## 2021-09-08 DIAGNOSIS — I10 Essential (primary) hypertension: Secondary | ICD-10-CM | POA: Diagnosis not present

## 2021-09-08 DIAGNOSIS — J9601 Acute respiratory failure with hypoxia: Secondary | ICD-10-CM | POA: Diagnosis not present

## 2021-09-08 LAB — CBC
HCT: 33 % — ABNORMAL LOW (ref 36.0–46.0)
Hemoglobin: 10.8 g/dL — ABNORMAL LOW (ref 12.0–15.0)
MCH: 32.9 pg (ref 26.0–34.0)
MCHC: 32.7 g/dL (ref 30.0–36.0)
MCV: 100.6 fL — ABNORMAL HIGH (ref 80.0–100.0)
Platelets: 248 10*3/uL (ref 150–400)
RBC: 3.28 MIL/uL — ABNORMAL LOW (ref 3.87–5.11)
RDW: 13 % (ref 11.5–15.5)
WBC: 9 10*3/uL (ref 4.0–10.5)
nRBC: 0 % (ref 0.0–0.2)

## 2021-09-08 LAB — BASIC METABOLIC PANEL
Anion gap: 6 (ref 5–15)
BUN: 15 mg/dL (ref 8–23)
CO2: 24 mmol/L (ref 22–32)
Calcium: 8.1 mg/dL — ABNORMAL LOW (ref 8.9–10.3)
Chloride: 105 mmol/L (ref 98–111)
Creatinine, Ser: 0.64 mg/dL (ref 0.44–1.00)
GFR, Estimated: >60 mL/min (ref >60)
Glucose, Bld: 112 mg/dL — ABNORMAL HIGH (ref 70–99)
Potassium: 3.8 mmol/L (ref 3.5–5.1)
Sodium: 135 mmol/L (ref 135–145)

## 2021-09-08 LAB — STREP PNEUMONIAE URINARY ANTIGEN: Strep Pneumo Urinary Antigen: NEGATIVE

## 2021-09-08 MED ORDER — HYDROMORPHONE HCL 1 MG/ML IJ SOLN
1.0000 mg | Freq: Once | INTRAMUSCULAR | Status: AC
  Administered 2021-09-08: 1 mg via INTRAVENOUS
  Filled 2021-09-08: qty 1

## 2021-09-08 MED ORDER — SALINE SPRAY 0.65 % NA SOLN
1.0000 | NASAL | Status: DC | PRN
  Filled 2021-09-08: qty 44

## 2021-09-08 NOTE — Evaluation (Signed)
Physical Therapy Evaluation Patient Details Name: Carolyn Mckinney MRN: [id number] DOB: Jul 06, 1945 Today's Date: 09/08/2021  History of Present Illness  Pt is a 76 y.o. female presenting to ED wih R sided back pain, weakness, dyspnea, admitted for management of acute respiratory failure with hypoxia due to community acquired pneumonia.  PMH significant for breast cancer, cataracts, right knee fracture, hypertension, Mnire's disease, lumbar radiculopathy.   Clinical Impression  Pt is a 76 y.o. female with above HPI resulting in the deficits listed below (see PT Problem List). Pt typically lives alone and family comes to visit on weekend (saturdays). Pt required MIN A for bed mobility, sit to stand, and step pivot transfer to recliner chair. Pt is significantly limited by pain on eval. Also noted to have O2 desat reading to mid 60's while on RA upon therapist entry requiring increased time and 3L O2 to recover to >90%. Due to significantly limited activity tolerance, increased assist required for performance and safety with all mobility, and decreased caregiver support recommend short term rehab stay upon d/c. Pt will benefit from  continued skilled PT to maximize functional mobility and increase independence to work toward PLOF.         Recommendations for follow up therapy are one component of a multi-disciplinary discharge planning process, led by the attending physician.  Recommendations may be updated based on patient status, additional functional criteria and insurance authorization.  Follow Up Recommendations Skilled nursing-short term rehab (<3 hours/day)    Assistance Recommended at Discharge Frequent or constant Supervision/Assistance  Patient can return home with the following  A lot of help with bathing/dressing/bathroom;Assistance with cooking/housework;Assist for transportation;Help with stairs or ramp for entrance;A little help with walking and/or transfers    Equipment  Recommendations    Recommendations for Other Services       Functional Status Assessment Patient has had a recent decline in their functional status and demonstrates the ability to make significant improvements in function in a reasonable and predictable amount of time.     Precautions / Restrictions Precautions Precautions: Fall Restrictions Weight Bearing Restrictions: No      Mobility  Bed Mobility Overal bed mobility: Needs Assistance Bed Mobility: Supine to Sit     Supine to sit: Min assist, HOB elevated     General bed mobility comments: increased time with heavy use of bed rails and therapist assist to bring trunk to upright. Vocalizing out in pain. Able to scoot hips to EOB in prep for transfers with increased time and assist from therapist with use of chuck pad.    Transfers Overall transfer level: Needs assistance Equipment used: 1 person hand held assist Transfers: Sit to/from Stand, Bed to chair/wheelchair/BSC Sit to Stand: Min guard, From elevated surface   Step pivot transfers: Min assist       General transfer comment: STS x2 from elevated bed surface to simulate home environment. Increased time to achieve full stand and pt stating that she has been trying to use B LEs for power up vs using UEs due to pain. HHA provided as pt deferring use of RW for transfers due to pain with WB through UEs on RW, reports she has trialed at home. Able to take small shuffle steps to recliner chair with single HHA from therapist and use of R UE on recliner armrest for stability, improved foot clearance of R LE noted with last 2 lateral steps to recliner. Attempted B HHA but pt with increased pain on R with WB.  Ambulation/Gait                  Stairs            Wheelchair Mobility    Modified Rankin (Stroke Patients Only)       Balance                                             Pertinent Vitals/Pain Pain Assessment Pain  Assessment: Faces Faces Pain Scale: Hurts whole lot Pain Location: mid to low back bilaterally R>L Pain Descriptors / Indicators: Sore, Discomfort, Grimacing, Guarding, Moaning Pain Intervention(s): Limited activity within patient's tolerance, Monitored during session, Repositioned    Home Living Family/patient expects to be discharged to:: Private residence Living Arrangements: Alone Available Help at Discharge: Family Type of Home: House Home Access: Level entry       Home Layout: One level Home Equipment: Conservation officer, nature (2 wheels);Shower seat;Hand held Veterinary surgeon - single point Additional Comments: daughter lives in [place]. Comes by at least 1x/week, will provide some meals for the week. Pt has a 82 month old puppy. She is active and does exercises with light weights/cardio.    Prior Function Prior Level of Function : Independent/Modified Independent;Driving             Mobility Comments: "been very active until this pain started", active in choir. ADLs Comments: independent     Hand Dominance        Extremity/Trunk Assessment   Upper Extremity Assessment Upper Extremity Assessment: Generalized weakness    Lower Extremity Assessment Lower Extremity Assessment: RLE deficits/detail;LLE deficits/detail RLE Deficits / Details: grossly 4-5/ throughout, limited by LB pain with MMT LLE Deficits / Details: grossly 4-5/ throughout, limited by LB pain with MMT    Cervical / Trunk Assessment Cervical / Trunk Assessment: Normal  Communication   Communication: No difficulties  Cognition Arousal/Alertness: Awake/alert Behavior During Therapy: WFL for tasks assessed/performed Overall Cognitive Status: Within Functional Limits for tasks assessed                                 General Comments: alert and oriented but noted with tangential conversation throughout session and increased time to express thoughts        General Comments General  comments (skin integrity, edema, etc.): Pt on RA upon entry with pulse ox reading 67%. Placed on 3L [place] and pt required increased time to recover, O2 sats checked with 2 different devices during session. Pt maintained on 3L  throughout mobility, O2 low observed 88-89% while on 3L. 92% at end of session.    Exercises     Assessment/Plan    PT Assessment Patient needs continued PT services  PT Problem List Decreased strength;Decreased range of motion;Decreased activity tolerance;Decreased balance;Decreased mobility;Decreased safety awareness;Cardiopulmonary status limiting activity;Pain       PT Treatment Interventions DME instruction;Gait training;Stair training;Functional mobility training;Therapeutic activities;Therapeutic exercise;Balance training;Patient/family education    PT Goals (Current goals can be found in the Care Plan section)  Acute Rehab PT Goals Patient Stated Goal: have less pain and be up and active again PT Goal Formulation: With patient/family Time For Goal Achievement: 09/22/21 Potential to Achieve Goals: Good    Frequency Min 2X/week     Co-evaluation  AM-PAC PT "6 Clicks" Mobility  Outcome Measure Help needed turning from your back to your side while in a flat bed without using bedrails?: A Little Help needed moving from lying on your back to sitting on the side of a flat bed without using bedrails?: A Lot Help needed moving to and from a bed to a chair (including a wheelchair)?: A Little Help needed standing up from a chair using your arms (e.g., wheelchair or bedside chair)?: A Little Help needed to walk in hospital room?: Total Help needed climbing 3-5 steps with a railing? : Total 6 Click Score: 13    End of Session Equipment Utilized During Treatment: Gait belt;Oxygen Activity Tolerance: Patient limited by pain Patient left: in chair;with call bell/phone within reach;with chair alarm set;with family/visitor present Nurse  Communication: Mobility status;Other (comment) (O2 saturations) PT Visit Diagnosis: Unsteadiness on feet (R26.81);Other abnormalities of gait and mobility (R26.89);Muscle weakness (generalized) (M62.81);Pain Pain - Right/Left:  (bilateral R>L) Pain - part of body:  (thoracic and lumbar region)    Time: 5883-2549 PT Time Calculation (min) (ACUTE ONLY): 58 min   Charges:   PT Evaluation $PT Eval Low Complexity: 1 Low PT Treatments $Therapeutic Activity: 38-52 mins        Festus Barren PT, DPT  Acute Rehabilitation Services  Office [phone] 09/08/2021, 2:06 PM

## 2021-09-08 NOTE — Progress Notes (Signed)
[facility] PROGRESS NOTE    Progress Note  Carolyn Mckinney  LOV:[id number] DOB: [birth year] DOA: 09/06/2021 PCP: Shelda Pal, DO     Brief Narrative:   Carolyn Mckinney is an 76 y.o. female past medical history significant for seasonal allergies, breast cancer cataracts right knee fracture Mnire's disease and lumbar radiculopathy comes into the emergency room due to right-sided back pain was found to be hypoxic, she also endorses feeling weak and dyspneic.  She was started on supplemental oxygen now requiring 3 L, mild leukocytosis, Chest x-ray showed no acute disease, CT angio PE but patchy lingular and bilateral lower lobe opacity.    Assessment/Plan:   Acute respiratory failure with hypoxia 2/2  CAP (community acquired pneumonia): Still requiring 3 L of oxygen to keep saturations greater 92%. Tmax of 99, leukocytosis is resolved. Continue empiric Rocephin and azithromycin. Out of bed to chair, encourage incentive spirometry. Consult physical therapy. Culture data has been sent awaiting results.  Essential hypertension Continue current home regimen.  Lumbar radiculopathy Continue oxycodone and hydromorphone for breakthrough. Continue duloxetine and Lyrica.   Physical therapy has been consulted.  Hypovolemic hyponatremia: Resolved with fluid resuscitation.  Chronic SI joint pain: Physical therapy has been consulted continue narcotics. Continue Lyrica.   DVT prophylaxis: lovenox Family Communication:none Status is: Inpatient Remains inpatient appropriate because: Acute respiratory failure with hypoxia secondary to pneumonia    Code Status:     Code Status Orders  (From admission, onward)           Start     Ordered   09/07/21 1610  Full code  Continuous        09/07/21 1609           Code Status History     This patient has a current code status but no historical code status.      Advance Directive Documentation     Flowsheet Row Most Recent Value  Type of Advance Directive Healthcare Power of Attorney, Living will  Pre-existing out of facility DNR order (yellow form or pink MOST form) --  "MOST" Form in Place? --         IV Access:   Peripheral IV   Procedures and diagnostic studies:   CT Angio Chest PE W and/or Wo Contrast  Result Date: 09/07/2021 CLINICAL DATA:  Chest/back pain, positive D-dimer. History of breast cancer. EXAM: CT ANGIOGRAPHY CHEST WITH CONTRAST TECHNIQUE: Multidetector CT imaging of the chest was performed using the standard protocol during bolus administration of intravenous contrast. Multiplanar CT image reconstructions and MIPs were obtained to evaluate the vascular anatomy. RADIATION DOSE REDUCTION: This exam was performed according to the departmental dose-optimization program which includes automated exposure control, adjustment of the mA and/or kV according to patient size and/or use of iterative reconstruction technique. CONTRAST:  71m OMNIPAQUE IOHEXOL 350 MG/ML SOLN COMPARISON:  Chest radiograph dated 09/07/2021 FINDINGS: Cardiovascular: Satisfactory opacification of the bilateral pulmonary arteries to the lobar level. Evaluation of the lower lobes is mildly constrained by superimposed pulmonary opacities. Within that constraint, there is no evidence of pulmonary embolism. Although not tailored for evaluation of the thoracic aorta, there is no evidence thoracic aortic aneurysm or dissection. Mild atherosclerotic calcifications of the arch. The heart is normal in size.  No pericardial effusion. Mediastinum/Nodes: No suspicious mediastinal lymphadenopathy. Visualized thyroid is unremarkable. Lungs/Pleura: Patchy bilateral lower lobe opacities, favoring atelectasis, although superimposed mild pneumonia is possible. Mild lingular atelectasis/pneumonia. Additional mild dependent atelectasis in the posterior upper lobes. No suspicious pulmonary  nodules. No pleural effusion or  pneumothorax. Upper Abdomen: 11 mm fluid density lesion in the posterior right hepatic lobe (series 4/image 83), favoring a benign cyst. Splenosis in the left upper abdomen. Musculoskeletal: Degenerative changes of the visualized thoracolumbar spine. Review of the MIP images confirms the above findings. IMPRESSION: No evidence of pulmonary embolism. Patchy lingular and bilateral lower lobe opacities, favoring atelectasis. Superimposed mild pneumonia is possible, although not favored. Aortic Atherosclerosis (ICD10-I70.0). Electronically Signed   By: Julian Hy M.D.   On: 09/07/2021 02:17   DG Chest Port 1 View  Result Date: 09/07/2021 CLINICAL DATA:  Back pain. EXAM: PORTABLE CHEST 1 VIEW COMPARISON:  None Available. FINDINGS: Diffuse chronic interstitial coarsening. There are bibasilar atelectasis or scarring. No focal consolidation, pleural effusion, pneumothorax. Top-normal cardiac silhouette. Osteopenia with degenerative changes of the spine. No acute osseous pathology. IMPRESSION: No active disease. Electronically Signed   By: Anner Crete M.D.   On: 09/07/2021 00:35     Medical Consultants:   None.   Subjective:    Carolyn Mckinney she relates her breathing is unchanged compared to yesterday.  Objective:    Vitals:   09/07/21 1345 09/07/21 1755 09/07/21 2115 09/08/21 0149  BP: 117/68 118/66 125/63 110/67  Pulse: 84 87 86 83  Resp: '18 16 14   '$ Temp: 98.4 F (36.9 C) 98.8 F (37.1 C) 99 F (37.2 C) 99.1 F (37.3 C)  TempSrc: Oral Oral Oral Oral  SpO2: 97% 92% 93% 96%  Weight:      Height:       SpO2: 96 % O2 Flow Rate (L/min): 3 L/min   Intake/Output Summary (Last 24 hours) at 09/08/2021 0750 Last data filed at 09/08/2021 0559 Gross per 24 hour  Intake 1700.3 ml  Output --  Net 1700.3 ml   Filed Weights   09/06/21 1912  Weight: 63.5 kg    Exam: General exam: In no acute distress. Respiratory system: Good air movement and crackles at bases mainly on the  right Cardiovascular system: S1 & S2 heard, RRR. No JVD. Gastrointestinal system: Abdomen is nondistended, soft and nontender.  Extremities: No pedal edema. Skin: No rashes, lesions or ulcers Psychiatry: Judgement and insight appear normal. Mood & affect appropriate.    Data Reviewed:    Labs: Basic Metabolic Panel: Recent Labs  Lab 09/06/21 2300 09/08/21 0525  NA 131* 135  K 4.1 3.8  CL 98 105  CO2 26 24  GLUCOSE 105* 112*  BUN 12 15  CREATININE 0.63 0.64  CALCIUM 8.7* 8.1*   GFR Estimated Creatinine Clearance: 51.1 mL/min (by C-G formula based on SCr of 0.64 mg/dL). Liver Function Tests: Recent Labs  Lab 09/06/21 2300  AST 27  ALT 19  ALKPHOS 69  BILITOT 1.0  PROT 7.0  ALBUMIN 3.4*   Recent Labs  Lab 09/06/21 2300  LIPASE 23   No results for input(s): AMMONIA in the last 168 hours. Coagulation profile No results for input(s): INR, PROTIME in the last 168 hours. COVID-19 Labs  Recent Labs    09/07/21 0020  DDIMER 2.00*    Lab Results  Component Value Date   SARSCOV2NAA NEGATIVE 09/07/2021   [place] Not Detected 09/27/2020    CBC: Recent Labs  Lab 09/06/21 2300 09/08/21 0525  WBC 10.8* 9.0  NEUTROABS 7.7  --   HGB 12.8 10.8*  HCT 36.8 33.0*  MCV 96.6 100.6*  PLT 278 248   Cardiac Enzymes: No results for input(s): CKTOTAL, CKMB, CKMBINDEX, TROPONINI in the last  168 hours. BNP (last 3 results) No results for input(s): PROBNP in the last 8760 hours. CBG: No results for input(s): GLUCAP in the last 168 hours. D-Dimer: Recent Labs    09/07/21 0020  DDIMER 2.00*   Hgb A1c: No results for input(s): HGBA1C in the last 72 hours. Lipid Profile: No results for input(s): CHOL, HDL, LDLCALC, TRIG, CHOLHDL, LDLDIRECT in the last 72 hours. Thyroid function studies: No results for input(s): TSH, T4TOTAL, T3FREE, THYROIDAB in the last 72 hours.  Invalid input(s): FREET3 Anemia work up: No results for input(s): VITAMINB12, FOLATE,  FERRITIN, TIBC, IRON, RETICCTPCT in the last 72 hours. Sepsis Labs: Recent Labs  Lab 09/06/21 2300 09/08/21 0525  WBC 10.8* 9.0   Microbiology Recent Results (from the past 240 hour(s))  Resp Panel by RT-PCR (Flu A&B, Covid) Nasopharyngeal Swab     Status: None   Collection Time: 09/07/21 12:21 AM   Specimen: Nasopharyngeal Swab; Nasopharyngeal(NP) swabs in vial transport medium  Result Value Ref Range Status   SARS Coronavirus 2 by RT PCR NEGATIVE NEGATIVE Final    Comment: (NOTE) SARS-CoV-2 target nucleic acids are NOT DETECTED.  The SARS-CoV-2 RNA is generally detectable in upper respiratory specimens during the acute phase of infection. The lowest concentration of SARS-CoV-2 viral copies this assay can detect is 138 copies/mL. A negative result does not preclude SARS-Cov-2 infection and should not be used as the sole basis for treatment or other patient management decisions. A negative result may occur with  improper specimen collection/handling, submission of specimen other than nasopharyngeal swab, presence of viral mutation(s) within the areas targeted by this assay, and inadequate number of viral copies(<138 copies/mL). A negative result must be combined with clinical observations, patient history, and epidemiological information. The expected result is Negative.  Fact Sheet for Patients:  EntrepreneurPulse.com.au  Fact Sheet for Healthcare Providers:  IncredibleEmployment.be  This test is no t yet approved or cleared by the [place] FDA and  has been authorized for detection and/or diagnosis of SARS-CoV-2 by FDA under an Emergency Use Authorization (EUA). This EUA will remain  in effect (meaning this test can be used) for the duration of the COVID-19 declaration under Section 564(b)(1) of the Act, 21 U.S.C.section 360bbb-3(b)(1), unless the authorization is terminated  or revoked sooner.       Influenza A by PCR NEGATIVE  NEGATIVE Final   Influenza B by PCR NEGATIVE NEGATIVE Final    Comment: (NOTE) The Xpert Xpress SARS-CoV-2/FLU/RSV plus assay is intended as an aid in the diagnosis of influenza from Nasopharyngeal swab specimens and should not be used as a sole basis for treatment. Nasal washings and aspirates are unacceptable for Xpert Xpress SARS-CoV-2/FLU/RSV testing.  Fact Sheet for Patients: EntrepreneurPulse.com.au  Fact Sheet for Healthcare Providers: IncredibleEmployment.be  This test is not yet approved or cleared by the [place] FDA and has been authorized for detection and/or diagnosis of SARS-CoV-2 by FDA under an Emergency Use Authorization (EUA). This EUA will remain in effect (meaning this test can be used) for the duration of the COVID-19 declaration under Section 564(b)(1) of the Act, 21 U.S.C. section 360bbb-3(b)(1), unless the authorization is terminated or revoked.  Performed at [facility], [address] [place], [place] [address]      Medications:    amLODipine  5 mg Oral Daily   DULoxetine  60 mg Oral QHS   enoxaparin (LOVENOX) injection  40 mg Subcutaneous Q24H   ISOtretinoin  40 mg Oral BID  losartan  100 mg Oral Daily   pregabalin  150 mg Oral BID   traZODone  50-100 mg Oral QHS   valACYclovir  500 mg Oral Daily   zinc sulfate  220 mg Oral Daily   Continuous Infusions:  sodium chloride 100 mL/hr at 09/08/21 0559   azithromycin 250 mL/hr at 09/08/21 0559   cefTRIAXone (ROCEPHIN)  IV        LOS: 1 day   Charlynne Cousins  [facility]  09/08/2021, 7:50 AM

## 2021-09-09 ENCOUNTER — Inpatient Hospital Stay (HOSPITAL_COMMUNITY): Payer: Medicare Other

## 2021-09-09 DIAGNOSIS — I10 Essential (primary) hypertension: Secondary | ICD-10-CM | POA: Diagnosis not present

## 2021-09-09 DIAGNOSIS — J189 Pneumonia, unspecified organism: Secondary | ICD-10-CM | POA: Diagnosis not present

## 2021-09-09 DIAGNOSIS — J9601 Acute respiratory failure with hypoxia: Secondary | ICD-10-CM | POA: Diagnosis not present

## 2021-09-09 LAB — VITAMIN B12: Vitamin B-12: 344 pg/mL (ref 180–914)

## 2021-09-09 IMAGING — MR MR THORACIC SPINE W/O CM
6 of 7 series · 37 of 48 positions shown · non-contrast
Comparison: CT chest dated [DATE].

CLINICAL DATA: Progressive mid back pain. History of breast cancer.

EXAM:
MRI THORACIC SPINE WITHOUT CONTRAST
TECHNIQUE: Multiplanar, multisequence MR imaging of the thoracic spine was
performed. No intravenous contrast was administered.

[Series 17: T1 · sagittal · 4.0mm · 1.72mm/px · 1 of 5 slices shown (1 of 2)]
[im 1/5]
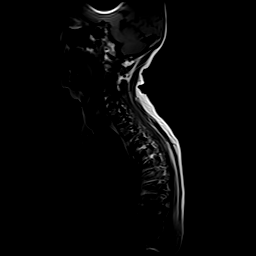

[Series 18: STIR · sagittal · 3.0mm · 1.00mm/px · 5 of 15 slices shown]
[im 1/15]
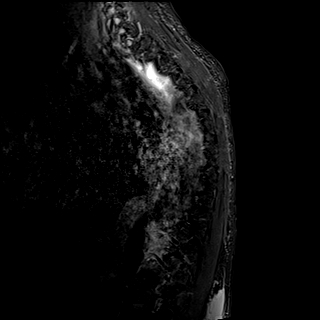
[im 4/15]
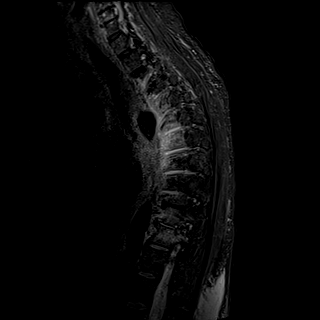
[im 8/15]
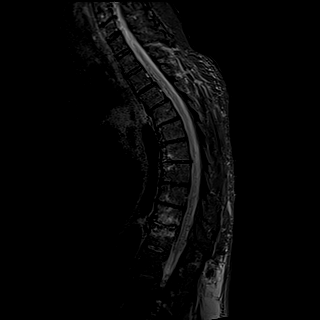
[im 11/15]
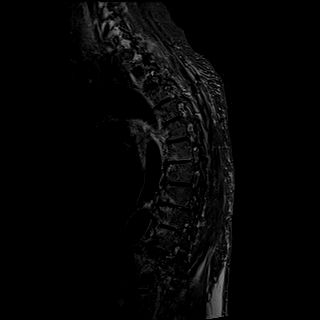
[im 15/15]
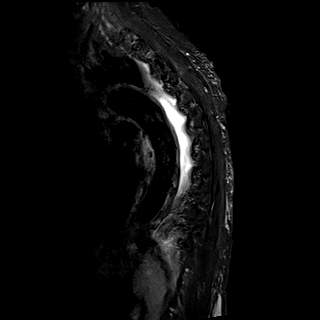

[Series 19: T1 · sagittal · 3.0mm · 1.00mm/px · 5 of 15 slices shown (2 of 2)]
[im 1/15]
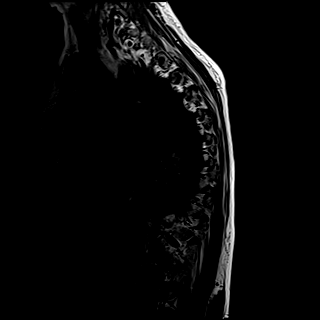
[im 4/15]
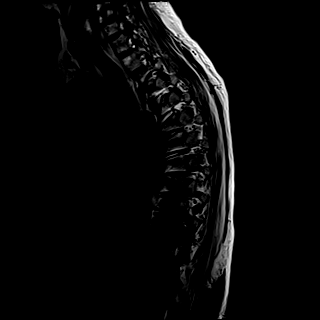
[im 8/15]
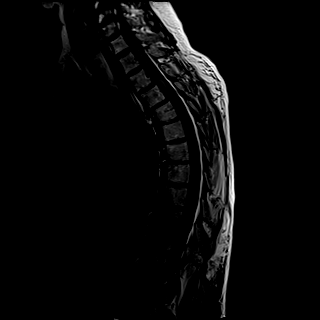
[im 11/15]
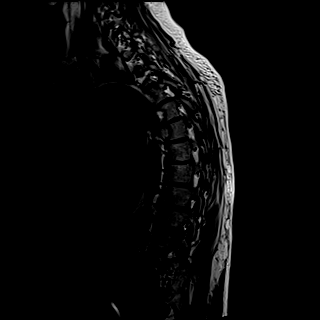
[im 15/15]
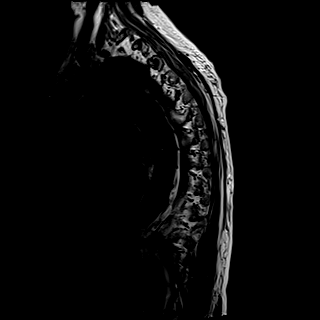

[Series 20: T2 · sagittal · 3.0mm · 0.83mm/px · 5 of 15 slices shown (1 of 2)]
[im 1/15]
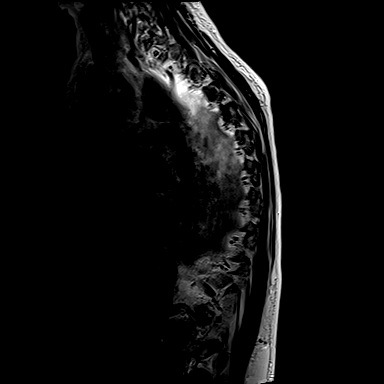
[im 4/15]
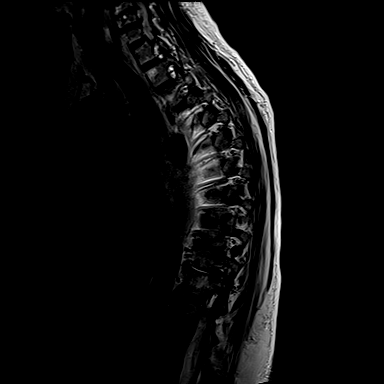
[im 8/15]
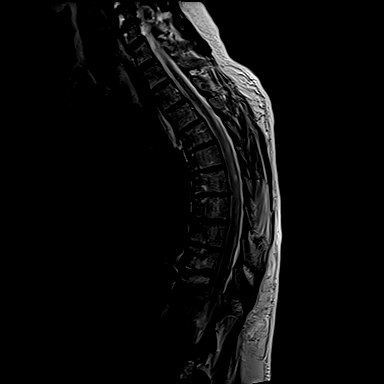
[im 11/15]
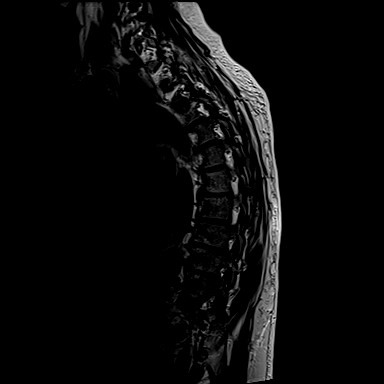
[im 15/15]
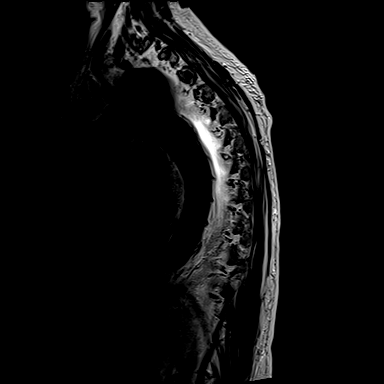

[Series 21: T2 · axial · 4.0mm · 0.78mm/px · z∈[-277,-98]mm · 13 of 46 slices shown (2 of 2)]
[im 1/46]
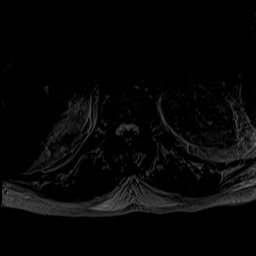
[im 4/46]
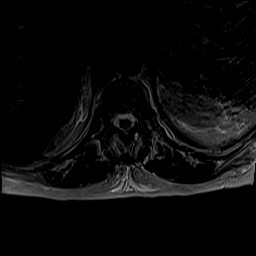
[im 7/46]
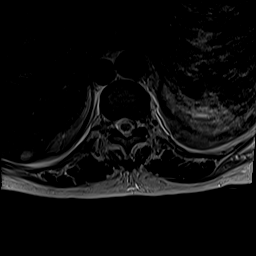
[im 11/46]
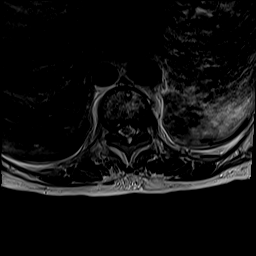
[im 14/46]
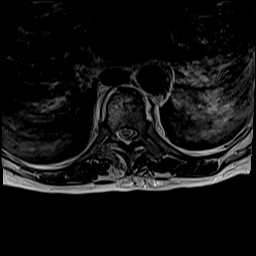
[im 18/46]
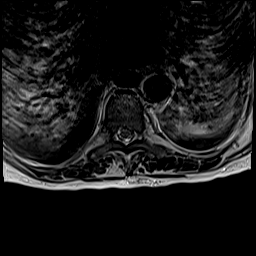
[im 21/46]
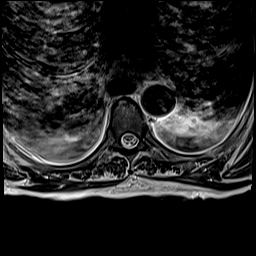
[im 25/46]
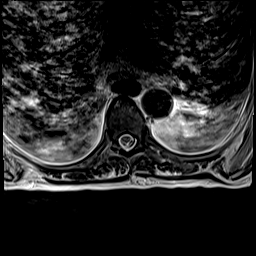
[im 28/46]
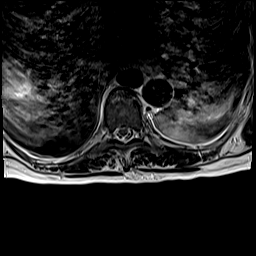
[im 32/46]
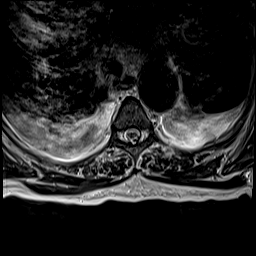
[im 35/46]
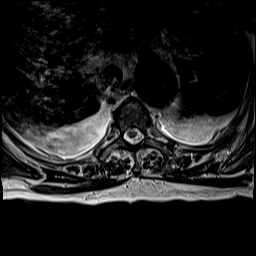
[im 39/46]
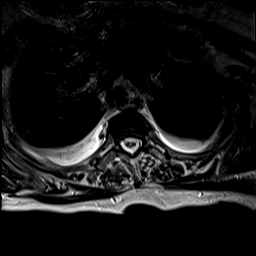
[im 46/46]
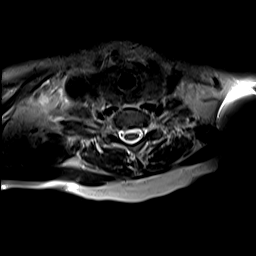

[Series 22: t2_me2d_tra · axial · 4.0mm · 0.39mm/px · z∈[-277,-98]mm · 8 of 46 slices shown]
[im 1/46]
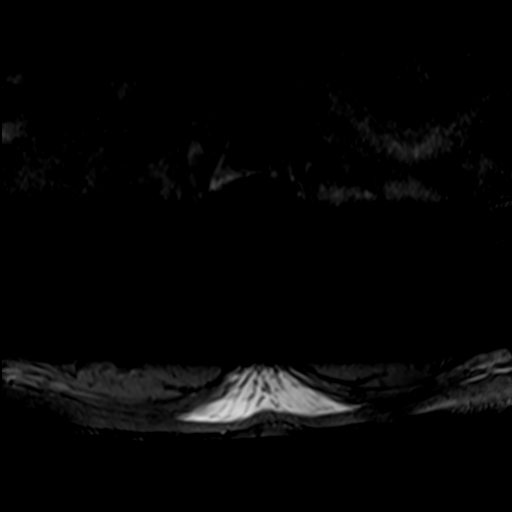
[im 7/46]
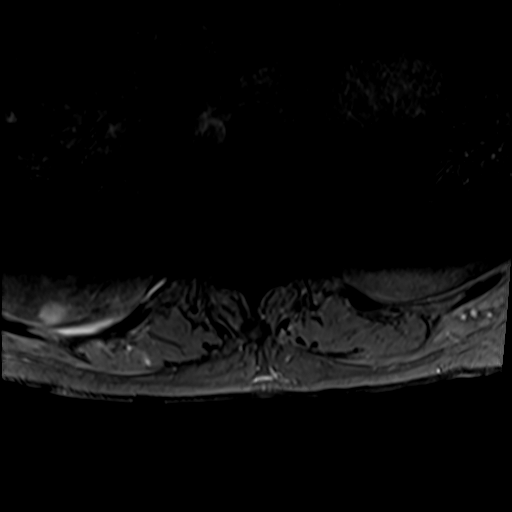
[im 14/46]
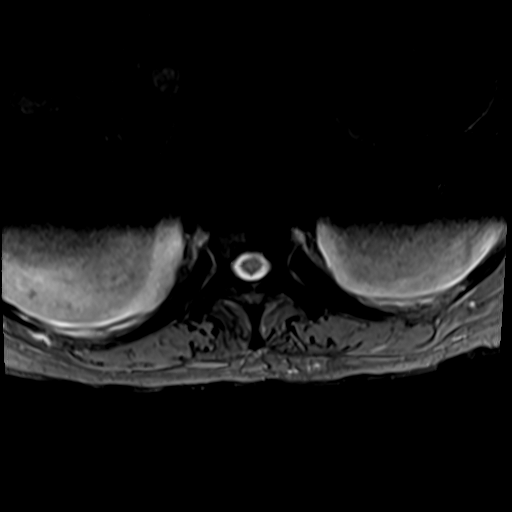
[im 21/46]
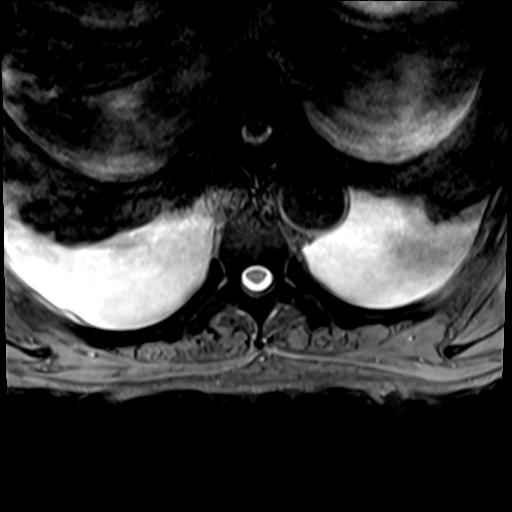
[im 25/46]
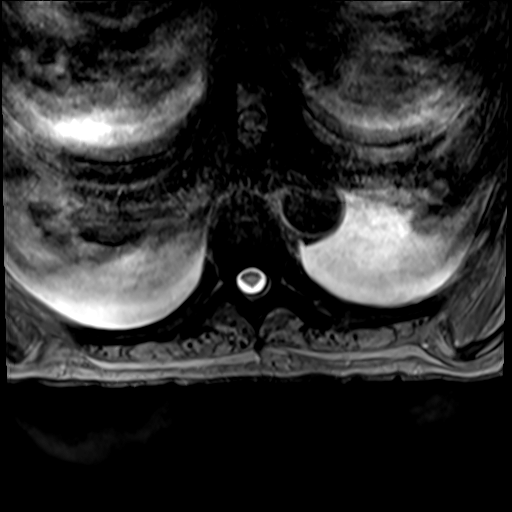
[im 32/46]
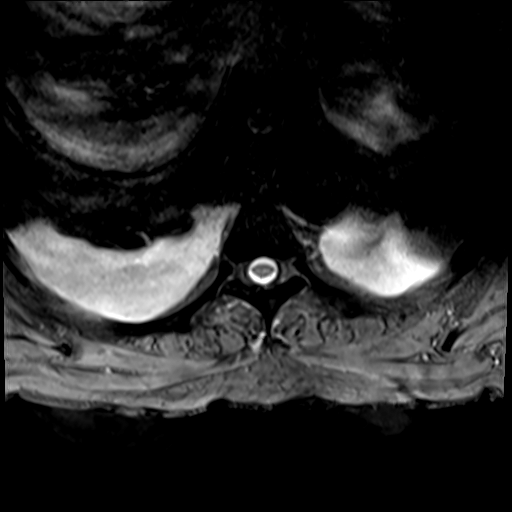
[im 39/46]
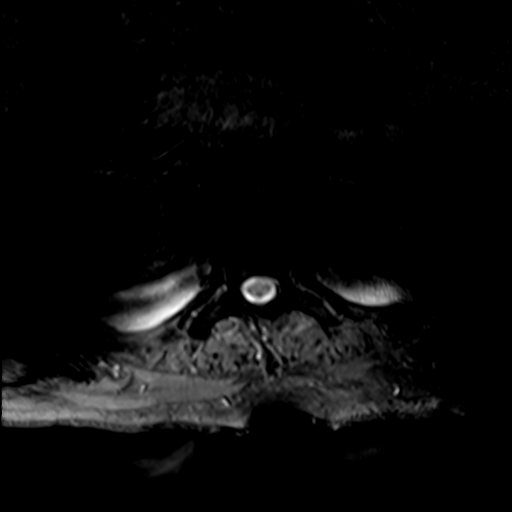
[im 46/46]
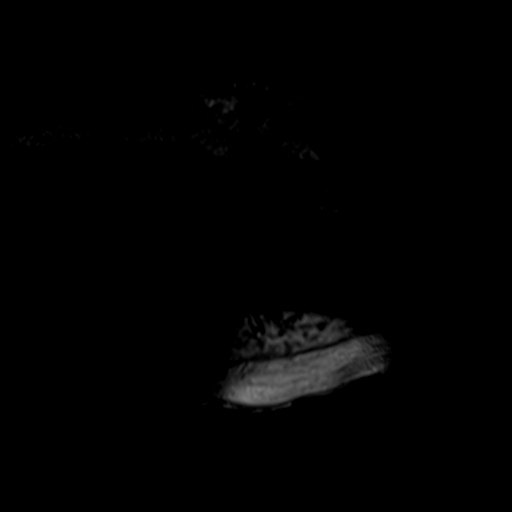

[37 of 48 positions shown; findings below may reference images not displayed]

FINDINGS: Alignment:  Physiologic.

Vertebrae: No fracture, evidence of discitis, or bone lesion.
Prominent asymmetric right-sided degenerative endplate marrow edema
at T10-T11. Small amount of degenerative fluid within the T10-T11
disc space. Milder degenerative endplate marrow edema anteriorly at
T7-T8 and T9-T10.

Cord:  Normal signal and morphology.

Paraspinal and other soft tissues: Increased now moderate bilateral
pleural effusions with continued significant bilateral lower lobe
atelectasis.

Disc levels:

Small circumferential disc osteophyte complex asymmetric to the
right at T10-T11 with mild spinal canal and bilateral neuroforaminal
stenosis. Mild degenerative disc disease and facet arthropathy from
T3-T4 through T9-T10 without spinal canal or neuroforaminal
stenosis.
IMPRESSION: 1. No evidence of metastatic disease.  No fracture.
2. Severe degenerative disc disease at T10-T11 with mild stenosis.
3. Increased now moderate bilateral pleural effusions with continued
significant bilateral lower lobe atelectasis.

## 2021-09-09 IMAGING — MR MR LUMBAR SPINE W/O CM
5 of 6 series · 29 of 48 positions shown · non-contrast
Comparison: X-ray [DATE]

CLINICAL DATA: Low back pain.  History of breast cancer

EXAM:
MRI LUMBAR SPINE WITHOUT CONTRAST
TECHNIQUE: Multiplanar, multisequence MR imaging of the lumbar spine was
performed. No intravenous contrast was administered.

[Series 22: T1 · sagittal · 4.0mm · 0.81mm/px · 5 of 14 slices shown (1 of 2)]
[im 1/14]
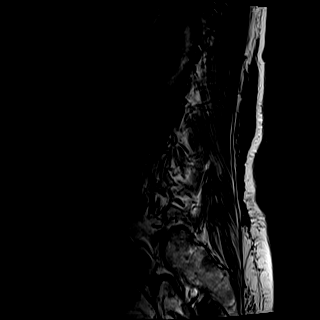
[im 4/14]
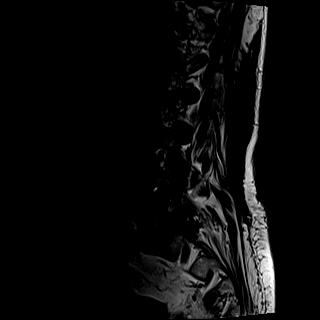
[im 7/14]
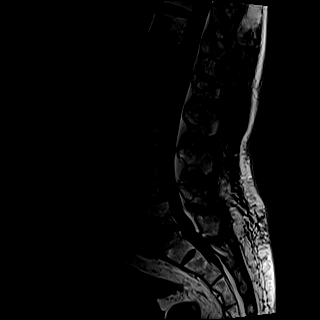
[im 10/14]
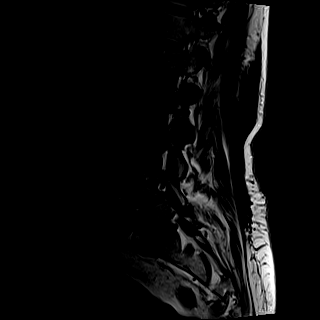
[im 14/14]
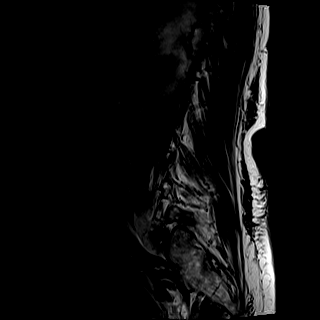

[Series 23: T2 · sagittal · 4.0mm · 0.81mm/px · 5 of 14 slices shown (1 of 2)]
[im 1/14]
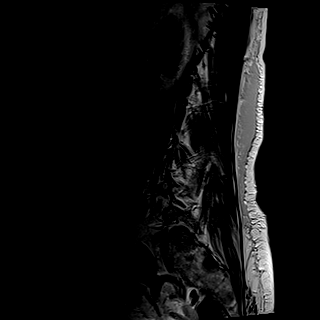
[im 4/14]
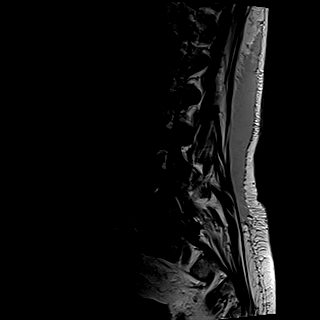
[im 7/14]
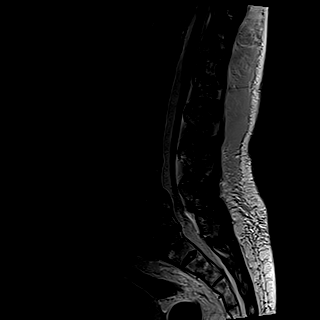
[im 10/14]
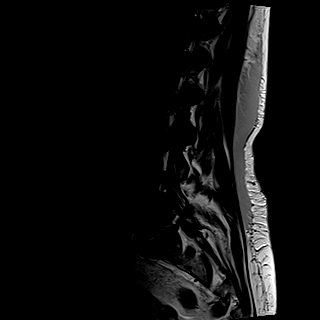
[im 14/14]
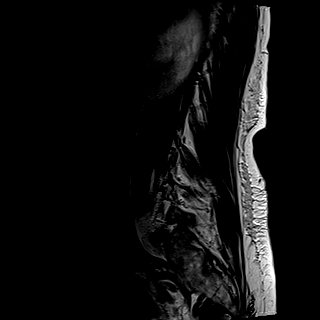

[Series 24: STIR · sagittal · 4.0mm · 0.51mm/px · 3 of 14 slices shown]
[im 1/14]
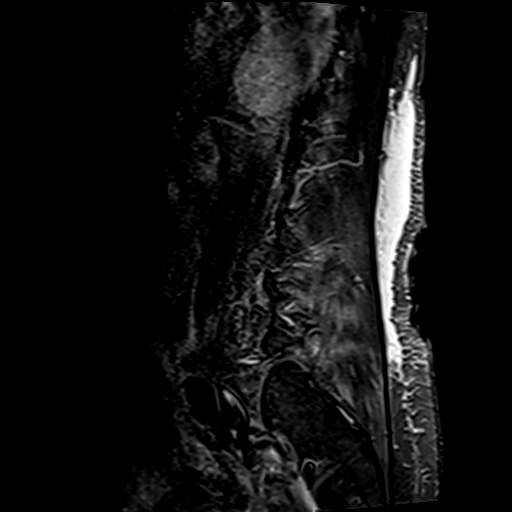
[im 4/14]
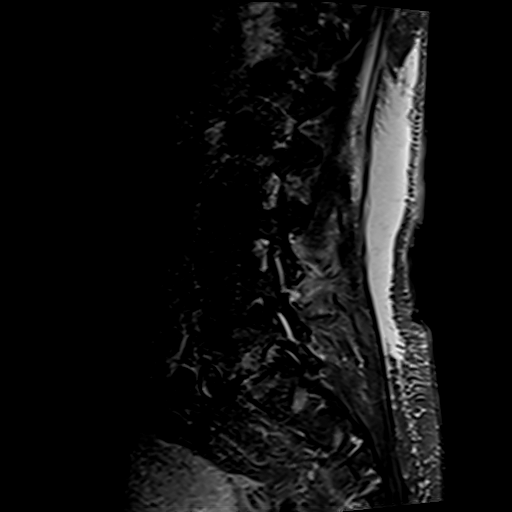
[im 7/14]
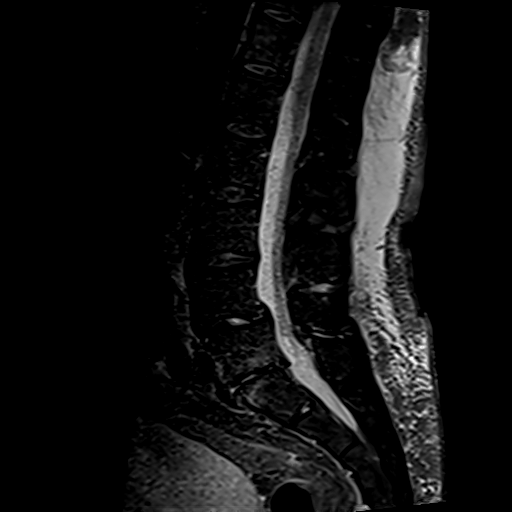

[Series 25: T2 · axial · 4.0mm · 0.62mm/px · z∈[-487,-274]mm · 8 of 40 slices shown (2 of 2)]
[im 1/40]
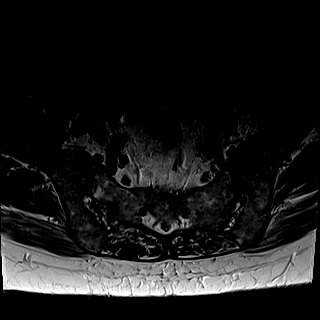
[im 7/40]
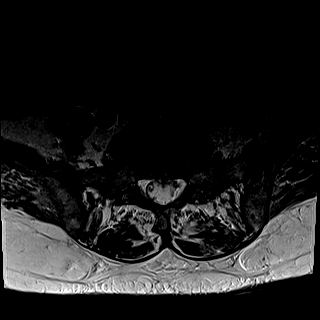
[im 13/40]
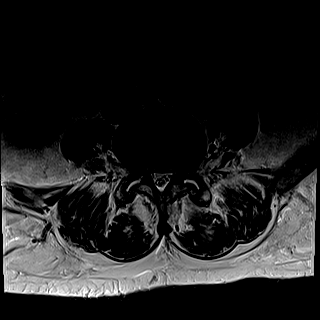
[im 19/40]
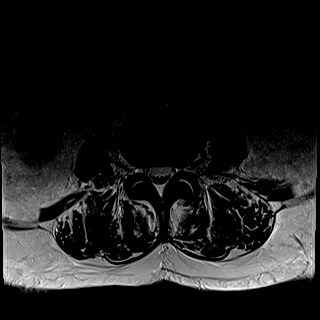
[im 22/40]
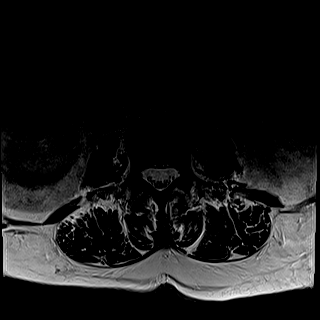
[im 28/40]
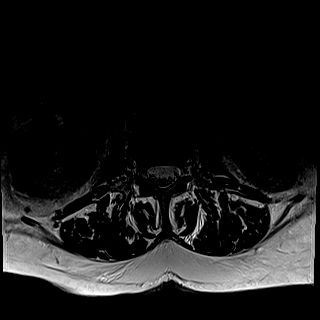
[im 34/40]
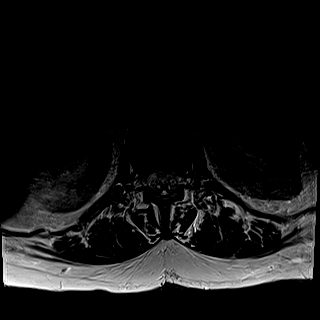
[im 40/40]
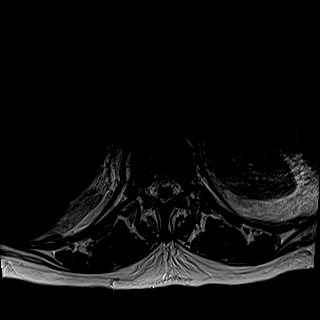

[Series 26: T1 · axial · 4.0mm · 0.39mm/px · z∈[-487,-274]mm · 8 of 40 slices shown (2 of 2)]
[im 1/40]
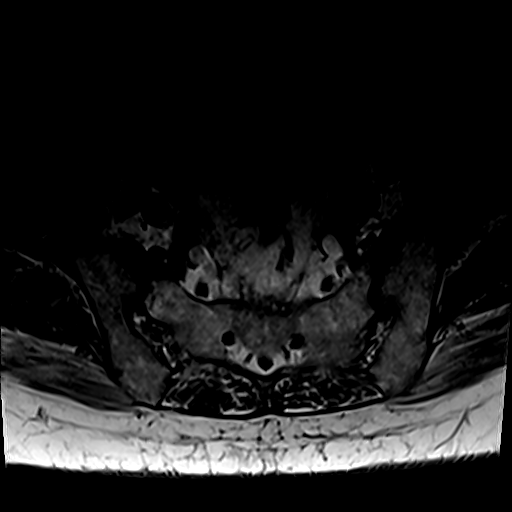
[im 7/40]
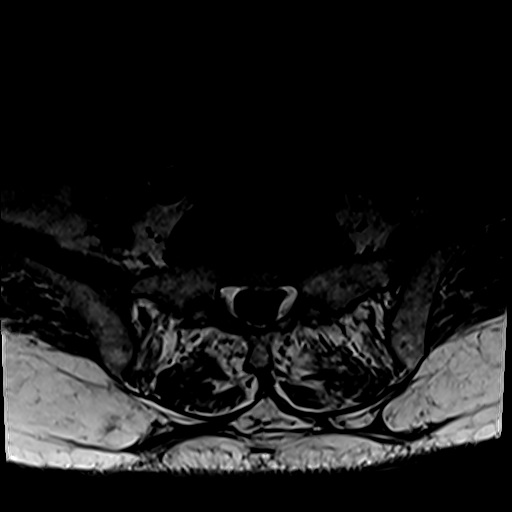
[im 13/40]
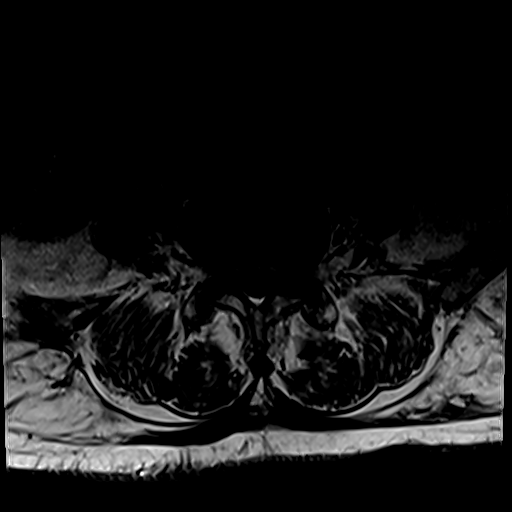
[im 19/40]
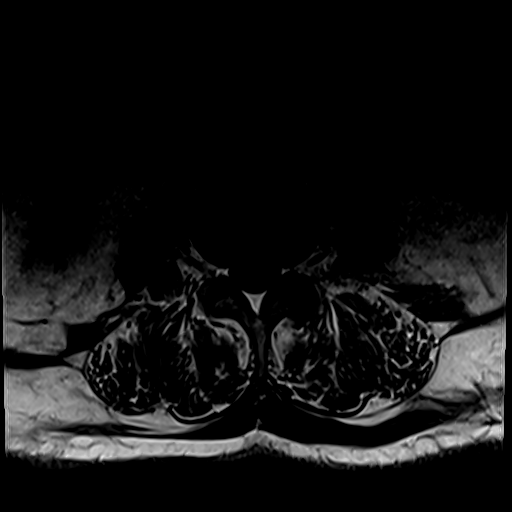
[im 22/40]
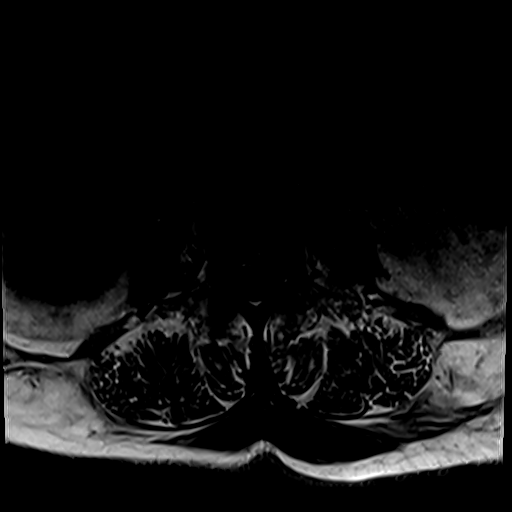
[im 28/40]
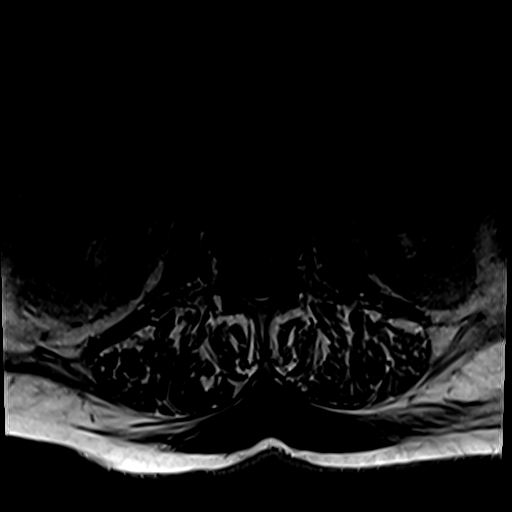
[im 34/40]
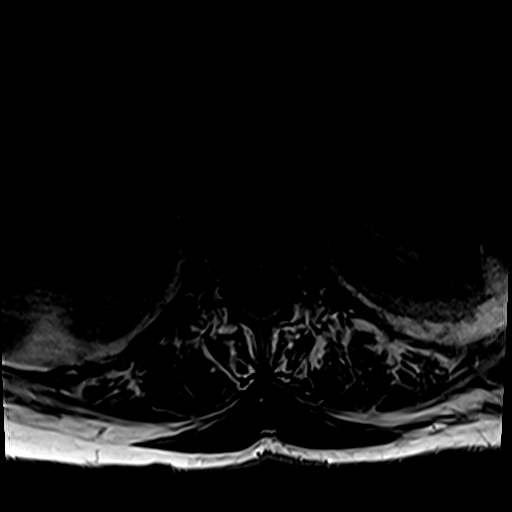
[im 40/40]
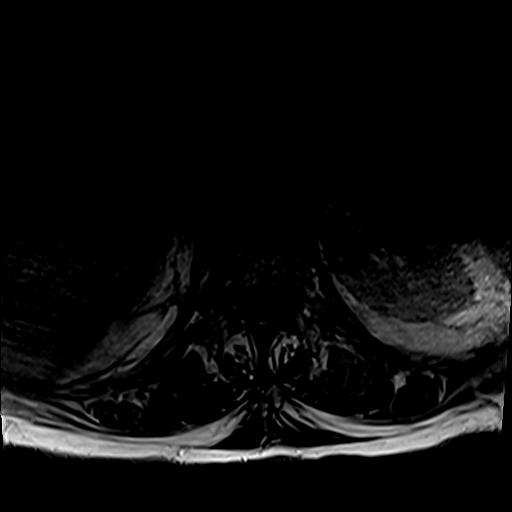

[29 of 48 positions shown; findings below may reference images not displayed]

FINDINGS: Segmentation:  Standard.

Alignment:  5 mm grade 1 anterolisthesis L4 on L5.

Vertebrae: No fracture, evidence of discitis, or bone lesion.
Discogenic endplate marrow changes are most pronounced at L5-S1.
Reactive subchondral marrow signal changes associated with the right
L5-S1 facet joint.

Conus medullaris and cauda equina: Conus extends to the L1 level.
Conus and cauda equina appear normal.

Paraspinal and other soft tissues: Azygos continuation of the IVC.
Simple appearing fluid layering within the dependent subcutaneous
soft tissues of the low back. Mild posterior paraspinal
intramuscular edema. No intramuscular fluid collection.

Disc levels:

T12-L1: Unremarkable.

L1-L2: Unremarkable.

L2-L3: No disc protrusion. Mild bilateral facet arthropathy. No
foraminal or canal stenosis.

L3-L4: Minimal annular disc bulge. Mild bilateral facet arthropathy.
No foraminal or canal stenosis.

L4-L5: Grade 1 anterolisthesis with disc uncovering and mild diffuse
disc bulge. Moderate bilateral facet arthropathy. Moderate bilateral
subarticular recess stenosis. Severe left and moderate right
foraminal stenosis. No canal stenosis.

L5-S1: Advanced disc height loss with diffuse disc bulge and
endplate osteophytic ridging. Mild-to-moderate bilateral facet
arthropathy. Severe bilateral foraminal stenosis. No canal stenosis.
IMPRESSION: 1. No acute osseous abnormality of the lumbar spine. No evidence of
osseous metastatic disease.
2. Lower lumbar spondylosis with severe left and moderate right
foraminal stenosis at L4-L5 and severe bilateral foraminal stenosis
at L5-S1. No canal stenosis at any level.
3. Simple appearing fluid layering within the dependent subcutaneous
soft tissues of the low back, likely pooling of extracellular fluid.
No organized fluid collection.
4. Mild posterior paraspinal intramuscular edema, which may be
reactive. No intramuscular fluid collection.

## 2021-09-09 MED ORDER — LORAZEPAM 2 MG/ML IJ SOLN
0.5000 mg | INTRAMUSCULAR | Status: DC | PRN
  Administered 2021-09-09: 0.5 mg via INTRAVENOUS
  Filled 2021-09-09: qty 1

## 2021-09-09 MED ORDER — IBUPROFEN 800 MG PO TABS: 800.0000 mg | ORAL_TABLET | Freq: Three times a day (TID) | ORAL | Status: DC

## 2021-09-09 MED ORDER — HYDROMORPHONE HCL 1 MG/ML IJ SOLN
1.0000 mg | INTRAMUSCULAR | Status: AC | PRN
  Administered 2021-09-09 (×2): 1 mg via INTRAVENOUS
  Filled 2021-09-09 (×2): qty 1

## 2021-09-09 MED ORDER — PANTOPRAZOLE SODIUM 40 MG PO TBEC
40.0000 mg | DELAYED_RELEASE_TABLET | Freq: Two times a day (BID) | ORAL | Status: DC
Start: 2021-09-09 — End: 2021-09-09

## 2021-09-09 MED ORDER — TRAMADOL HCL 50 MG PO TABS: 50.0000 mg | ORAL_TABLET | Freq: Four times a day (QID) | ORAL | Status: DC | PRN

## 2021-09-09 MED ORDER — CEFDINIR 300 MG PO CAPS
300.0000 mg | ORAL_CAPSULE | Freq: Two times a day (BID) | ORAL | Status: DC
  Administered 2021-09-09 – 2021-09-10 (×3): 300 mg via ORAL
  Filled 2021-09-09 (×3): qty 1

## 2021-09-09 MED ORDER — AZITHROMYCIN 250 MG PO TABS
500.0000 mg | ORAL_TABLET | Freq: Every day | ORAL | Status: DC
Start: 2021-09-10 — End: 2021-09-10
  Administered 2021-09-10: 500 mg via ORAL
  Filled 2021-09-09: qty 2

## 2021-09-09 MED ORDER — OXYCODONE HCL 5 MG PO TABS
10.0000 mg | ORAL_TABLET | ORAL | Status: DC | PRN
  Administered 2021-09-09: 10 mg via ORAL
  Filled 2021-09-09: qty 2

## 2021-09-09 NOTE — Progress Notes (Signed)
[facility] PROGRESS NOTE    Progress Note  Carolyn Mckinney  JJK:[id number] DOB: 03-Aug-1945 DOA: 09/06/2021 PCP: Shelda Pal, DO     Brief Narrative:   Carolyn Mckinney is an 76 y.o. female past medical history significant for seasonal allergies, breast cancer cataracts right knee fracture Mnire's disease and lumbar radiculopathy comes into the emergency room due to right-sided back pain was found to be hypoxic, she also endorses feeling weak and dyspneic.  She was started on supplemental oxygen now requiring 3 L, mild leukocytosis, Chest x-ray showed no acute disease, CT angio PE but patchy lingular and bilateral lower lobe opacity.    Assessment/Plan:   Acute respiratory failure with hypoxia 2/2  CAP (community acquired pneumonia): She still requiring 3 L of oxygen to keep saturations greater than 92%. Anything above 88% on her is good. Tmax of 99.2, leukocytosis is resolved. Continue IV Rocephin and azithromycin. She relates she is slightly nauseated. Out of bed to chair, encourage incentive spirometry. Consult physical therapy evaluated the patient, she will need to go to skilled nursing facility.  Essential hypertension Continue current home regimen.  Thoracic and lumbar radiculopathy with pain exacerbation Continue oxycodone and hydromorphone for breakthrough. Continue duloxetine and Lyrica.   She relate she continues to have pain unbearable has been using narcotics frequently. Get a thoracic and lumbar spine MRI.  Hypovolemic hyponatremia: Resolved with fluid resuscitation.  Chronic SI joint pain: Physical therapy has evaluated the patient. Continue Lyrica.  Macrocytic anemia: Check  RBC folate and B12.   DVT prophylaxis: lovenox Family Communication:none Status is: Inpatient Remains inpatient appropriate because: Acute respiratory failure with hypoxia secondary to pneumonia    Code Status:     Code Status Orders  (From admission,  onward)           Start     Ordered   09/07/21 1610  Full code  Continuous        09/07/21 1609           Code Status History     This patient has a current code status but no historical code status.      Advance Directive Documentation    Flowsheet Row Most Recent Value  Type of Advance Directive Healthcare Power of Attorney, Living will  Pre-existing out of facility DNR order (yellow form or pink MOST form) --  "MOST" Form in Place? --         IV Access:   Peripheral IV   Procedures and diagnostic studies:   No results found.   Medical Consultants:   None.   Subjective:    Carolyn Mckinney relates her back pain is not controlled.  Objective:    Vitals:   09/08/21 0149 09/08/21 1327 09/08/21 1918 09/09/21 0314  BP: 110/67 123/68 122/73 109/64  Pulse: 83 82 82 73  Resp:  '16 15 15  '$ Temp: 99.1 F (37.3 C) 98.4 F (36.9 C) 99.2 F (37.3 C) 98.1 F (36.7 C)  TempSrc: Oral Oral Oral Oral  SpO2: 96% 98% 98% 92%  Weight:      Height:       SpO2: 92 % O2 Flow Rate (L/min): 3 L/min   Intake/Output Summary (Last 24 hours) at 09/09/2021 0856 Last data filed at 09/09/2021 0631 Gross per 24 hour  Intake 360 ml  Output 950 ml  Net -590 ml    Filed Weights   09/06/21 1912  Weight: 63.5 kg    Exam: General exam: In no acute distress. Respiratory  system: Good air movement and clear to auscultation. Cardiovascular system: S1 & S2 heard, RRR. No JVD. Gastrointestinal system: Abdomen is nondistended, soft and nontender.  Extremities: No pedal edema. Skin: No rashes, lesions or ulcers Psychiatry: Judgement and insight appear normal. Mood & affect appropriate.   Data Reviewed:    Labs: Basic Metabolic Panel: Recent Labs  Lab 09/06/21 2300 09/08/21 0525  NA 131* 135  K 4.1 3.8  CL 98 105  CO2 26 24  GLUCOSE 105* 112*  BUN 12 15  CREATININE 0.63 0.64  CALCIUM 8.7* 8.1*    GFR Estimated Creatinine Clearance: 51.1 mL/min (by C-G  formula based on SCr of 0.64 mg/dL). Liver Function Tests: Recent Labs  Lab 09/06/21 2300  AST 27  ALT 19  ALKPHOS 69  BILITOT 1.0  PROT 7.0  ALBUMIN 3.4*    Recent Labs  Lab 09/06/21 2300  LIPASE 23    No results for input(s): AMMONIA in the last 168 hours. Coagulation profile No results for input(s): INR, PROTIME in the last 168 hours. COVID-19 Labs  Recent Labs    09/07/21 0020  DDIMER 2.00*     Lab Results  Component Value Date   SARSCOV2NAA NEGATIVE 09/07/2021   [place] Not Detected 09/27/2020    CBC: Recent Labs  Lab 09/06/21 2300 09/08/21 0525  WBC 10.8* 9.0  NEUTROABS 7.7  --   HGB 12.8 10.8*  HCT 36.8 33.0*  MCV 96.6 100.6*  PLT 278 248    Cardiac Enzymes: No results for input(s): CKTOTAL, CKMB, CKMBINDEX, TROPONINI in the last 168 hours. BNP (last 3 results) No results for input(s): PROBNP in the last 8760 hours. CBG: No results for input(s): GLUCAP in the last 168 hours. D-Dimer: Recent Labs    09/07/21 0020  DDIMER 2.00*    Hgb A1c: No results for input(s): HGBA1C in the last 72 hours. Lipid Profile: No results for input(s): CHOL, HDL, LDLCALC, TRIG, CHOLHDL, LDLDIRECT in the last 72 hours. Thyroid function studies: No results for input(s): TSH, T4TOTAL, T3FREE, THYROIDAB in the last 72 hours.  Invalid input(s): FREET3 Anemia work up: No results for input(s): VITAMINB12, FOLATE, FERRITIN, TIBC, IRON, RETICCTPCT in the last 72 hours. Sepsis Labs: Recent Labs  Lab 09/06/21 2300 09/08/21 0525  WBC 10.8* 9.0    Microbiology Recent Results (from the past 240 hour(s))  Resp Panel by RT-PCR (Flu A&B, Covid) Nasopharyngeal Swab     Status: None   Collection Time: 09/07/21 12:21 AM   Specimen: Nasopharyngeal Swab; Nasopharyngeal(NP) swabs in vial transport medium  Result Value Ref Range Status   SARS Coronavirus 2 by RT PCR NEGATIVE NEGATIVE Final    Comment: (NOTE) SARS-CoV-2 target nucleic acids are NOT DETECTED.  The  SARS-CoV-2 RNA is generally detectable in upper respiratory specimens during the acute phase of infection. The lowest concentration of SARS-CoV-2 viral copies this assay can detect is 138 copies/mL. A negative result does not preclude SARS-Cov-2 infection and should not be used as the sole basis for treatment or other patient management decisions. A negative result may occur with  improper specimen collection/handling, submission of specimen other than nasopharyngeal swab, presence of viral mutation(s) within the areas targeted by this assay, and inadequate number of viral copies(<138 copies/mL). A negative result must be combined with clinical observations, patient history, and epidemiological information. The expected result is Negative.  Fact Sheet for Patients:  EntrepreneurPulse.com.au  Fact Sheet for Healthcare Providers:  IncredibleEmployment.be  This test is no t yet approved or cleared  by the [place] and  has been authorized for detection and/or diagnosis of SARS-CoV-2 by FDA under an Emergency Use Authorization (EUA). This EUA will remain  in effect (meaning this test can be used) for the duration of the COVID-19 declaration under Section 564(b)(1) of the Act, 21 U.S.C.section 360bbb-3(b)(1), unless the authorization is terminated  or revoked sooner.       Influenza A by PCR NEGATIVE NEGATIVE Final   Influenza B by PCR NEGATIVE NEGATIVE Final    Comment: (NOTE) The Xpert Xpress SARS-CoV-2/FLU/RSV plus assay is intended as an aid in the diagnosis of influenza from Nasopharyngeal swab specimens and should not be used as a sole basis for treatment. Nasal washings and aspirates are unacceptable for Xpert Xpress SARS-CoV-2/FLU/RSV testing.  Fact Sheet for Patients: EntrepreneurPulse.com.au  Fact Sheet for Healthcare Providers: IncredibleEmployment.be  This test is not yet approved or  cleared by the [place] FDA and has been authorized for detection and/or diagnosis of SARS-CoV-2 by FDA under an Emergency Use Authorization (EUA). This EUA will remain in effect (meaning this test can be used) for the duration of the COVID-19 declaration under Section 564(b)(1) of the Act, 21 U.S.C. section 360bbb-3(b)(1), unless the authorization is terminated or revoked.  Performed at [facility], [address] [place], [place] [address]      Medications:    amLODipine  5 mg Oral Daily   DULoxetine  60 mg Oral QHS   enoxaparin (LOVENOX) injection  40 mg Subcutaneous Q24H   ISOtretinoin  40 mg Oral BID   losartan  100 mg Oral Daily   pregabalin  150 mg Oral BID   traZODone  50-100 mg Oral QHS   valACYclovir  500 mg Oral Daily   zinc sulfate  220 mg Oral Daily   Continuous Infusions:  sodium chloride 100 mL/hr at 09/08/21 1611   azithromycin 500 mg (09/09/21 0537)   cefTRIAXone (ROCEPHIN)  IV 2 g (09/08/21 1759)      LOS: 2 days   Charlynne Cousins  [facility]  09/09/2021, 8:56 AM

## 2021-09-09 NOTE — Progress Notes (Signed)
OT Cancellation Note  Patient Details Name: Geovana Gebel MRN: [id number] DOB: 1945-06-12   Cancelled Treatment:    Reason Eval/Treat Not Completed: Other (comment). Awaiting MRI results prior to OT evaluation.   Yara Tomkinson L Phinehas Grounds 09/09/2021, 11:22 AM

## 2021-09-09 NOTE — NC FL2 (Signed)
[place] MEDICAID FL2 LEVEL OF CARE SCREENING TOOL     IDENTIFICATION  Patient Name: Carolyn Mckinney Birthdate: 1945/07/10 Sex: female Admission Date (Current Location): 09/06/2021  [facility] and [place] Number:  Herbalist and Address:  [facility] [place], [place] [address] [place], [place] [address]      Provider Number: [id number]  Attending Physician Name and Address:  Charlynne Cousins, MD  Relative Name and Phone Number:  Greer Ee (Daughter)   [phone] [facility])    Current Level of Care: Hospital Recommended Level of Care: [place] Prior Approval Number:    Date Approved/Denied:   PASRR Number: [id number]  Discharge Plan: SNF    Current Diagnoses: Patient Active Problem List   Diagnosis Date Noted   CAP (community acquired pneumonia) 09/07/2021   Acute respiratory failure with hypoxia ([place]) 09/07/2021   Sacroiliac joint dysfunction of left side 07/25/2021   Trigger ring finger of left hand 06/27/2021   Chronic right SI joint pain 06/27/2021   Facet hypertrophy of lumbar region 06/17/2020   Lumbar radiculopathy 06/10/2020   Primary osteoarthritis of left knee 05/12/2020   Strain of left hamstring 05/12/2020   Stress fracture of left foot 01/26/2020   Weakness of both legs 07/01/2019   Closed fracture of right tibial plateau 06/11/2019   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   Acne 06/03/2018   Insomnia 06/03/2018   Neuropathic pain 05/02/2018   Essential hypertension 05/02/2018   Meniere disease, 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    Orientation RESPIRATION BLADDER Height & Weight     Self, Time, Situation, Place  O2 (3L 02) External catheter Weight: 139 lb 15.9 oz (63.5 kg) Height:  '5\' 1"'$  (154.9 cm)  BEHAVIORAL SYMPTOMS/MOOD NEUROLOGICAL BOWEL NUTRITION STATUS      Continent Diet (See d/c summary)  AMBULATORY STATUS COMMUNICATION OF NEEDS Skin   Extensive Assist Verbally Normal                       Personal Care  Assistance Level of Assistance  Bathing, Feeding, Dressing Bathing Assistance: Limited assistance Feeding assistance: Independent Dressing Assistance: Limited assistance     Functional Limitations Info  Sight, Hearing, Speech Sight Info: Adequate Hearing Info: Adequate Speech Info: Adequate    SPECIAL CARE FACTORS FREQUENCY  PT (By licensed PT), OT (By licensed OT)     PT Frequency: 5x/week OT Frequency: 5x/week            Contractures Contractures Info: Not present    Additional Factors Info  Code Status, Allergies Code Status Info: Full code Allergies Info: no known allergies           Current Medications (09/09/2021):  This is the current hospital active medication list Current Facility-Administered Medications  Medication Dose Route Frequency Provider Last Rate Last Admin   0.9 %  sodium chloride infusion   Intravenous Continuous Charlynne Cousins, MD 100 mL/hr at 09/08/21 1611 New Bag at 09/08/21 1611   acetaminophen (TYLENOL) tablet 650 mg  650 mg Oral Q6H PRN Reubin Milan, MD       Or   acetaminophen (TYLENOL) suppository 650 mg  650 mg Rectal Q6H PRN Reubin Milan, MD       amLODipine (NORVASC) tablet 5 mg  5 mg Oral Daily Reubin Milan, MD   5 mg at 09/08/21 1027   [START ON 09/10/2021] azithromycin (ZITHROMAX) tablet 500 mg  500 mg Oral Daily Charlynne Cousins, MD  cefdinir (OMNICEF) capsule 300 mg  300 mg Oral Q12H Charlynne Cousins, MD       DULoxetine (CYMBALTA) DR capsule 60 mg  60 mg Oral QHS Reubin Milan, MD   60 mg at 09/08/21 2206   enoxaparin (LOVENOX) injection 40 mg  40 mg Subcutaneous Q24H Reubin Milan, MD   40 mg at 09/08/21 1758   HYDROmorphone (DILAUDID) injection 1 mg  1 mg Intravenous Q4H PRN Charlynne Cousins, MD       ipratropium-albuterol (DUONEB) 0.5-2.5 (3) MG/3ML nebulizer solution 3 mL  3 mL Nebulization Q6H PRN Reubin Milan, MD       ISOtretinoin (ACCUTANE) capsule 40 mg  40 mg  Oral BID Reubin Milan, MD   40 mg at 09/08/21 2211   lip balm (CARMEX) ointment   Topical PRN Reubin Milan, MD       LORazepam (ATIVAN) injection 0.5 mg  0.5 mg Intravenous Q4H PRN Charlynne Cousins, MD       losartan (COZAAR) tablet 100 mg  100 mg Oral Daily Reubin Milan, MD   100 mg at 09/08/21 1027   ondansetron (ZOFRAN) injection 4 mg  4 mg Intravenous Q8H PRN Reubin Milan, MD   4 mg at 09/07/21 1036   oxyCODONE (Oxy IR/ROXICODONE) immediate release tablet 10 mg  10 mg Oral Q4H PRN Charlynne Cousins, MD       pregabalin (LYRICA) capsule 150 mg  150 mg Oral BID Reubin Milan, MD   150 mg at 09/08/21 2206   sodium chloride (OCEAN) 0.65 % nasal spray 1 spray  1 spray Each Nare PRN Foust, Katy L, NP       traMADol (ULTRAM) tablet 50 mg  50 mg Oral Q6H PRN Charlynne Cousins, MD       traZODone (DESYREL) tablet 50-100 mg  50-100 mg Oral QHS Reubin Milan, MD   100 mg at 09/08/21 2206   valACYclovir (VALTREX) tablet 500 mg  500 mg Oral Daily Reubin Milan, MD   500 mg at 09/08/21 1027   zinc sulfate capsule 220 mg  220 mg Oral Daily Reubin Milan, MD   220 mg at 09/08/21 1027     Discharge Medications: Please see discharge summary for a list of discharge medications.  Relevant Imaging Results:  Relevant Lab Results:   Additional Information SSN [id number] Pt is covid vax x4  [employer], LCSW

## 2021-09-09 NOTE — TOC Initial Note (Addendum)
Transition of Care (TOC) - Initial/Assessment Note    Patient Details  Name: Carolyn Mckinney MRN: [id number] Date of Birth: 05/06/1945  Transition of Care (TOC) CM/SW Contact:    Cyrus P Kolar, LCSW Phone Number: 09/09/2021, 10:09 AM  Clinical Narrative:                  CSW met with pt to discuss disposition. Pt lives at home in a single story condo in [place]. Reports she has a daughter who lives in [place] and works in [place]. Pt has a walker from a previous knee injury. CSW explains PT recommendation for short term rehab at snf. Pt is agreeable to SNF workup; she prefers somewhere close to [place] where her daughter lives or where her daughter works in [place]. Pt consents to CSW updating daughter. She reports she is covid vaccinated x4. CSW completed fl2 and faxed bed requests in hub.   1200: Called pt daughter and provided update. Daughter agreeable to SNF. Daughter reports she visit's pt at least once a week. Notice pt dragging left leg on 09/03/21 and then a few days letter pt was struggling with mobility much more. Daughter states that pt has been independent and able to "roll around in the grass" with her new puppy, attend church, and function independently. She reports that she noticed pt taking longer to do things such as putting leash on dog starting about 2 months ago around the same time as pt starting Acutane. She spoke with pt about this put pt wanted to continue the Acutane. CSW will follow up regarding snf offers.   1515: Met with pt and called pt daughter on phone. Discussed bed offers and need for choice. They choose [facility] and are aware of 41$/day private room fee.   Confirmed with Whitney at [facility] that they can accept pt over weekend. Weekend TOC to contact Whitney  day of DC at [phone].  Pt will go to room 106. Rn to call report to [phone]  Expected Discharge Plan: Skilled Nursing Facility Barriers to Discharge: Continued Medical Work up, SNF  Pending bed offer   Patient Goals and CMS Choice Patient states their goals for this hospitalization and ongoing recovery are:: Agreeable to rehab; prefers somewhere close to daughters home in [place] or work in [place]      Expected Discharge Plan and Services Expected Discharge Plan: Skilled Nursing Facility       Living arrangements for the past 2 months: Single Family Home                                      Prior Living Arrangements/Services Living arrangements for the past 2 months: Single Family Home Lives with:: Self Patient language and need for interpreter reviewed:: Yes        Need for Family Participation in Patient Care: No (Comment) Care giver support system in place?: No (comment)   Criminal Activity/Legal Involvement Pertinent to Current Situation/Hospitalization: No - Comment as needed  Activities of Daily Living Home Assistive Devices/Equipment: None ADL Screening (condition at time of admission) Patient's cognitive ability adequate to safely complete daily activities?: No Is the patient deaf or have difficulty hearing?: No Does the patient have difficulty seeing, even when wearing glasses/contacts?: No Does the patient have difficulty concentrating, remembering, or making decisions?: No Patient able to express need for assistance with ADLs?: Yes Does the patient have difficulty   dressing or bathing?: Yes Independently performs ADLs?: No Communication: Independent Dressing (OT): Needs assistance Is this a change from baseline?: Change from baseline, expected to last <3days Grooming: Independent Feeding: Independent Bathing: Dependent Is this a change from baseline?: Change from baseline, expected to last <3 days Toileting: Needs assistance Is this a change from baseline?: Change from baseline, expected to last <3 days In/Out Bed: Dependent Is this a change from baseline?: Change from baseline, expected to last <3 days Walks in Home:  Independent Does the patient have difficulty walking or climbing stairs?: Yes Weakness of Legs: Both Weakness of Arms/Hands: Both  Permission Sought/Granted   Permission granted to share information with : Yes, Verbal Permission Granted  Share Information with NAME: Snead, Laura (Daughter)   [phone] (Mobile)           Emotional Assessment Appearance:: Appears stated age Attitude/Demeanor/Rapport: Engaged Affect (typically observed): Anxious Orientation: : Oriented to Self, Oriented to Place, Oriented to  Time, Oriented to Situation Alcohol / Substance Use: Not Applicable Psych Involvement: No (comment)  Admission diagnosis:  Hypoxia [R09.02] CAP (community acquired pneumonia) [J18.9] Pain of multiple sites [R52] Patient Active Problem List   Diagnosis Date Noted   CAP (community acquired pneumonia) 09/07/2021   Acute respiratory failure with hypoxia ([facility]) 09/07/2021   Sacroiliac joint dysfunction of left side 07/25/2021   Trigger ring finger of left hand 06/27/2021   Chronic right SI joint pain 06/27/2021   Facet hypertrophy of lumbar region 06/17/2020   Lumbar radiculopathy 06/10/2020   Primary osteoarthritis of left knee 05/12/2020   Strain of left hamstring 05/12/2020   Stress fracture of left foot 01/26/2020   Weakness of both legs 07/01/2019   Closed fracture of right tibial plateau 06/11/2019   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 [place] [address] Phone: [phone] Fax: [phone]     Social Determinants of Health (SDOH) Interventions    Readmission Risk Interventions     View : No data to display.           

## 2021-09-10 DIAGNOSIS — M533 Sacrococcygeal disorders, not elsewhere classified: Secondary | ICD-10-CM | POA: Diagnosis not present

## 2021-09-10 DIAGNOSIS — D649 Anemia, unspecified: Secondary | ICD-10-CM | POA: Diagnosis not present

## 2021-09-10 DIAGNOSIS — G8929 Other chronic pain: Secondary | ICD-10-CM | POA: Diagnosis not present

## 2021-09-10 DIAGNOSIS — L820 Inflamed seborrheic keratosis: Secondary | ICD-10-CM | POA: Diagnosis not present

## 2021-09-10 DIAGNOSIS — H8103 Meniere's disease, bilateral: Secondary | ICD-10-CM | POA: Diagnosis not present

## 2021-09-10 DIAGNOSIS — M545 Low back pain, unspecified: Secondary | ICD-10-CM | POA: Diagnosis not present

## 2021-09-10 DIAGNOSIS — J9601 Acute respiratory failure with hypoxia: Secondary | ICD-10-CM | POA: Diagnosis not present

## 2021-09-10 DIAGNOSIS — I10 Essential (primary) hypertension: Secondary | ICD-10-CM | POA: Diagnosis not present

## 2021-09-10 DIAGNOSIS — J189 Pneumonia, unspecified organism: Secondary | ICD-10-CM | POA: Diagnosis not present

## 2021-09-10 DIAGNOSIS — Z79899 Other long term (current) drug therapy: Secondary | ICD-10-CM | POA: Diagnosis not present

## 2021-09-10 DIAGNOSIS — G4700 Insomnia, unspecified: Secondary | ICD-10-CM | POA: Diagnosis not present

## 2021-09-10 DIAGNOSIS — Z853 Personal history of malignant neoplasm of breast: Secondary | ICD-10-CM | POA: Diagnosis not present

## 2021-09-10 DIAGNOSIS — F339 Major depressive disorder, recurrent, unspecified: Secondary | ICD-10-CM | POA: Diagnosis not present

## 2021-09-10 DIAGNOSIS — Z7401 Bed confinement status: Secondary | ICD-10-CM | POA: Diagnosis not present

## 2021-09-10 DIAGNOSIS — M2550 Pain in unspecified joint: Secondary | ICD-10-CM | POA: Diagnosis not present

## 2021-09-10 DIAGNOSIS — H269 Unspecified cataract: Secondary | ICD-10-CM | POA: Diagnosis not present

## 2021-09-10 DIAGNOSIS — D559 Anemia due to enzyme disorder, unspecified: Secondary | ICD-10-CM | POA: Diagnosis not present

## 2021-09-10 DIAGNOSIS — K130 Diseases of lips: Secondary | ICD-10-CM | POA: Diagnosis not present

## 2021-09-10 DIAGNOSIS — I781 Nevus, non-neoplastic: Secondary | ICD-10-CM | POA: Diagnosis not present

## 2021-09-10 DIAGNOSIS — M5416 Radiculopathy, lumbar region: Secondary | ICD-10-CM | POA: Diagnosis not present

## 2021-09-10 DIAGNOSIS — Z9981 Dependence on supplemental oxygen: Secondary | ICD-10-CM | POA: Diagnosis not present

## 2021-09-10 DIAGNOSIS — Z7409 Other reduced mobility: Secondary | ICD-10-CM | POA: Diagnosis not present

## 2021-09-10 DIAGNOSIS — L700 Acne vulgaris: Secondary | ICD-10-CM | POA: Diagnosis not present

## 2021-09-10 DIAGNOSIS — M5459 Other low back pain: Secondary | ICD-10-CM | POA: Diagnosis not present

## 2021-09-10 MED ORDER — CEFDINIR 300 MG PO CAPS: 300.0000 mg | ORAL_CAPSULE | Freq: Two times a day (BID) | ORAL | 0 refills | Status: DC

## 2021-09-10 MED ORDER — AZITHROMYCIN 250 MG PO TABS: ORAL_TABLET | ORAL | 0 refills | Status: DC

## 2021-09-10 MED ORDER — HYDROCODONE-ACETAMINOPHEN 5-325 MG PO TABS: 1.0000 | ORAL_TABLET | Freq: Four times a day (QID) | ORAL | 0 refills | Status: DC | PRN

## 2021-09-10 NOTE — Progress Notes (Signed)
EMS in the unit.  Patient discharged to [facility].

## 2021-09-10 NOTE — Progress Notes (Signed)
Report called to [facility].  Report given to [employer].

## 2021-09-10 NOTE — Progress Notes (Signed)
OT Cancellation Note  Patient Details Name: Carolyn Mckinney MRN: [id number] DOB: 1946/01/08   Cancelled Treatment:    Reason Eval/Treat Not Completed: Other (comment). Patient discharging today. Will defer OT evaluation to facility.  Annah Jasko L Ernisha Sorn 09/10/2021, 1:02 PM

## 2021-09-10 NOTE — Discharge Summary (Signed)
Physician Discharge Summary  Carolyn Mckinney TIR:[id number] DOB: 03-16-1946 DOA: 09/06/2021  PCP: Shelda Pal, DO  Admit date: 09/06/2021 Discharge date: 09/10/2021  Admitted From: Home Disposition:  SNF  Recommendations for Outpatient Follow-up:  Follow up with Ortho in 1-2 weeks Please obtain BMP/CBC in one week   Home Health:no Equipment/Devices:None  Discharge Condition:Stable CODE STATUS:Full Diet recommendation: Heart Healthy  Brief/Interim Summary: 76 y.o. female past medical history significant for seasonal allergies, breast cancer cataracts right knee fracture Mnire's disease and lumbar radiculopathy comes into the emergency room due to right-sided back pain was found to be hypoxic, she also endorses feeling weak and dyspneic.  She was started on supplemental oxygen now requiring 3 L, mild leukocytosis, Chest x-ray showed no acute disease, CT angio PE but patchy lingular and bilateral lower lobe opacity.  Discharge Diagnoses:  Principal Problem:   CAP (community acquired pneumonia) Active Problems:   Essential hypertension   Lumbar radiculopathy   Acute respiratory failure with hypoxia (HCC)  Acute respiratory failure with hypoxia secondary to community-acquired pneumonia: Required 2 L of supplemental oxygen she will have to go to skilled on oxygen. She was started empirically on IV Rocephin and azithromycin transition to oral she defervesced she will continue her antibiotic regimen as an outpatient. Wean to room air as an outpatient.  Essential hypertension: No change made to her medication.  Thoracic and lumbar radiculopathy: She was continued on her regimen of duloxetine and Lyrica and narcotics. She continued to complain of pain and MRI was done that showed no acute findings just chronic changes.  Hypovolemic hyponatremia: Resolved fluid retention.  Chronic SI joint pain: Physical therapy evaluated the patient recommended skilled nursing  facility continue Lyrica.  Macrocytic anemia: Noted B12 was 312 she will follow-up with her PCP as an outpatient to repeat test in 3 to 6 months.   Discharge Instructions  Discharge Instructions     Diet - low sodium heart healthy   Complete by: As directed    Increase activity slowly   Complete by: As directed       Allergies as of 09/10/2021   No Known Allergies      Medication List     TAKE these medications    amLODipine 5 MG tablet Commonly known as: NORVASC Take 1 tablet by mouth once daily   azithromycin 250 MG tablet Commonly known as: ZITHROMAX 1 tab daily   CALCIUM + D3 PO Take 1 tablet by mouth daily.   cefdinir 300 MG capsule Commonly known as: OMNICEF Take 1 capsule (300 mg total) by mouth every 12 (twelve) hours.   DULoxetine 60 MG capsule Commonly known as: Cymbalta Take 1 capsule (60 mg total) by mouth daily. What changed: when to take this   HYDROcodone-acetaminophen 5-325 MG tablet Commonly known as: NORCO/VICODIN Take 1 tablet by mouth every 6 (six) hours as needed for moderate pain.   IRON PO Take 1 tablet by mouth daily.   ISOtretinoin 40 MG capsule Commonly known as: ACCUTANE Take 40 mg by mouth 2 (two) times daily.   losartan 100 MG tablet Commonly known as: COZAAR Take 1 tablet (100 mg total) by mouth daily.   Menthol (Topical Analgesic) 16 % Liqd Apply 1 application. topically daily as needed (pain).   MULTIVITAMIN ADULT PO Take 1 tablet by mouth daily.   ondansetron 4 MG disintegrating tablet Commonly known as: ZOFRAN-ODT Take 1 tablet (4 mg total) by mouth every 8 (eight) hours as needed.   pregabalin 150 MG capsule  Commonly known as: LYRICA Take 1 capsule by mouth twice daily   PRESCRIPTION MEDICATION Place 1 drop into both eyes daily as needed (dry eyes). Patient not sure about name.   PSEUDOEPHEDRINE-DM PO Take 1 tablet by mouth daily.   Shingrix injection Generic drug: Zoster Vaccine Adjuvanted Inject  into the muscle.   traZODone 50 MG tablet Commonly known as: DESYREL TAKE 1 TO 2 TABLETS BY MOUTH AT BEDTIME AS NEEDED FOR SLEEP What changed:  when to take this additional instructions   valACYclovir 500 MG tablet Commonly known as: VALTREX TAKE 4 TABLETS BY MOUTH AT ONSET OF BREAKOUT AND REPEAT IN 12 HOURS. OTHERWISE, TAKE 1 TABLET BY MOUTH ONCE DAILY What changed: See the new instructions.   VITAMIN K PO Take 1 tablet by mouth daily.   zinc gluconate 50 MG tablet Take 50 mg by mouth daily.        Follow-up Information     Schedule an appointment as soon as possible for a visit  with Shelda Pal, DO.   Specialty: Family Medicine Contact information: [place] [address] [phone]         Schedule an appointment as soon as possible for a visit  with Rosemarie Ax, MD.   Specialties: Family Medicine, Emergency Medicine, Sports Medicine Contact information: [address] [place] [address] [phone]                No Known Allergies  Consultations: None   Procedures/Studies: CT Angio Chest PE W and/or Wo Contrast  Result Date: 09/07/2021 CLINICAL DATA:  Chest/back pain, positive D-dimer. History of breast cancer. EXAM: CT ANGIOGRAPHY CHEST WITH CONTRAST TECHNIQUE: Multidetector CT imaging of the chest was performed using the standard protocol during bolus administration of intravenous contrast. Multiplanar CT image reconstructions and MIPs were obtained to evaluate the vascular anatomy. RADIATION DOSE REDUCTION: This exam was performed according to the departmental dose-optimization program which includes automated exposure control, adjustment of the mA and/or kV according to patient size and/or use of iterative reconstruction technique. CONTRAST:  17m OMNIPAQUE IOHEXOL 350 MG/ML SOLN COMPARISON:  Chest radiograph dated 09/07/2021 FINDINGS: Cardiovascular: Satisfactory opacification of  the bilateral pulmonary arteries to the lobar level. Evaluation of the lower lobes is mildly constrained by superimposed pulmonary opacities. Within that constraint, there is no evidence of pulmonary embolism. Although not tailored for evaluation of the thoracic aorta, there is no evidence thoracic aortic aneurysm or dissection. Mild atherosclerotic calcifications of the arch. The heart is normal in size.  No pericardial effusion. Mediastinum/Nodes: No suspicious mediastinal lymphadenopathy. Visualized thyroid is unremarkable. Lungs/Pleura: Patchy bilateral lower lobe opacities, favoring atelectasis, although superimposed mild pneumonia is possible. Mild lingular atelectasis/pneumonia. Additional mild dependent atelectasis in the posterior upper lobes. No suspicious pulmonary nodules. No pleural effusion or pneumothorax. Upper Abdomen: 11 mm fluid density lesion in the posterior right hepatic lobe (series 4/image 83), favoring a benign cyst. Splenosis in the left upper abdomen. Musculoskeletal: Degenerative changes of the visualized thoracolumbar spine. Review of the MIP images confirms the above findings. IMPRESSION: No evidence of pulmonary embolism. Patchy lingular and bilateral lower lobe opacities, favoring atelectasis. Superimposed mild pneumonia is possible, although not favored. Aortic Atherosclerosis (ICD10-I70.0). Electronically Signed   By: SJulian HyM.D.   On: 09/07/2021 02:17   MR LUMBAR SPINE WO CONTRAST  Result Date: 09/09/2021 CLINICAL DATA:  Low back pain.  History of breast cancer EXAM: MRI LUMBAR SPINE WITHOUT CONTRAST  TECHNIQUE: Multiplanar, multisequence MR imaging of the lumbar spine was performed. No intravenous contrast was administered. COMPARISON:  X-ray 06/10/2020 FINDINGS: Segmentation:  Standard. Alignment:  5 mm grade 1 anterolisthesis L4 on L5. Vertebrae: No fracture, evidence of discitis, or bone lesion. Discogenic endplate marrow changes are most pronounced at L5-S1.  Reactive subchondral marrow signal changes associated with the right L5-S1 facet joint. Conus medullaris and cauda equina: Conus extends to the L1 level. Conus and cauda equina appear normal. Paraspinal and other soft tissues: Azygos continuation of the IVC. Simple appearing fluid layering within the dependent subcutaneous soft tissues of the low back. Mild posterior paraspinal intramuscular edema. No intramuscular fluid collection. Disc levels: T12-L1: Unremarkable. L1-L2: Unremarkable. L2-L3: No disc protrusion. Mild bilateral facet arthropathy. No foraminal or canal stenosis. L3-L4: Minimal annular disc bulge. Mild bilateral facet arthropathy. No foraminal or canal stenosis. L4-L5: Grade 1 anterolisthesis with disc uncovering and mild diffuse disc bulge. Moderate bilateral facet arthropathy. Moderate bilateral subarticular recess stenosis. Severe left and moderate right foraminal stenosis. No canal stenosis. L5-S1: Advanced disc height loss with diffuse disc bulge and endplate osteophytic ridging. Mild-to-moderate bilateral facet arthropathy. Severe bilateral foraminal stenosis. No canal stenosis. IMPRESSION: 1. No acute osseous abnormality of the lumbar spine. No evidence of osseous metastatic disease. 2. Lower lumbar spondylosis with severe left and moderate right foraminal stenosis at L4-L5 and severe bilateral foraminal stenosis at L5-S1. No canal stenosis at any level. 3. Simple appearing fluid layering within the dependent subcutaneous soft tissues of the low back, likely pooling of extracellular fluid. No organized fluid collection. 4. Mild posterior paraspinal intramuscular edema, which may be reactive. No intramuscular fluid collection. Electronically Signed   By: Davina Poke D.O.   On: 09/09/2021 15:16   DG Chest Port 1 View  Result Date: 09/07/2021 CLINICAL DATA:  Back pain. EXAM: PORTABLE CHEST 1 VIEW COMPARISON:  None Available. FINDINGS: Diffuse chronic interstitial coarsening. There are  bibasilar atelectasis or scarring. No focal consolidation, pleural effusion, pneumothorax. Top-normal cardiac silhouette. Osteopenia with degenerative changes of the spine. No acute osseous pathology. IMPRESSION: No active disease. Electronically Signed   By: Anner Crete M.D.   On: 09/07/2021 00:35   (Echo, Carotid, EGD, Colonoscopy, ERCP)    Subjective: No complaints  Discharge Exam: Vitals:   09/09/21 1950 09/10/21 0601  BP: 123/71 123/81  Pulse: 73 69  Resp: 18 18  Temp: 97.7 F (36.5 C) 97.7 F (36.5 C)  SpO2: 95% 95%   Vitals:   09/09/21 0314 09/09/21 1513 09/09/21 1950 09/10/21 0601  BP: 109/64 109/74 123/71 123/81  Pulse: 73 76 73 69  Resp: '15 20 18 18  '$ Temp: 98.1 F (36.7 C) 97.7 F (36.5 C) 97.7 F (36.5 C) 97.7 F (36.5 C)  TempSrc: Oral Oral Oral Oral  SpO2: 92% (!) 89% 95% 95%  Weight:      Height:        General: Pt is alert, awake, not in acute distress Cardiovascular: RRR, S1/S2 +, no rubs, no gallops Respiratory: CTA bilaterally, no wheezing, no rhonchi Abdominal: Soft, NT, ND, bowel sounds + Extremities: no edema, no cyanosis    The results of significant diagnostics from this hospitalization (including imaging, microbiology, ancillary and laboratory) are listed below for reference.     Microbiology: Recent Results (from the past 240 hour(s))  Resp Panel by RT-PCR (Flu A&B, Covid) Nasopharyngeal Swab     Status: None   Collection Time: 09/07/21 12:21 AM   Specimen: Nasopharyngeal Swab; Nasopharyngeal(NP) swabs in  vial transport medium  Result Value Ref Range Status   SARS Coronavirus 2 by RT PCR NEGATIVE NEGATIVE Final    Comment: (NOTE) SARS-CoV-2 target nucleic acids are NOT DETECTED.  The SARS-CoV-2 RNA is generally detectable in upper respiratory specimens during the acute phase of infection. The lowest concentration of SARS-CoV-2 viral copies this assay can detect is 138 copies/mL. A negative result does not preclude  SARS-Cov-2 infection and should not be used as the sole basis for treatment or other patient management decisions. A negative result may occur with  improper specimen collection/handling, submission of specimen other than nasopharyngeal swab, presence of viral mutation(s) within the areas targeted by this assay, and inadequate number of viral copies(<138 copies/mL). A negative result must be combined with clinical observations, patient history, and epidemiological information. The expected result is Negative.  Fact Sheet for Patients:  EntrepreneurPulse.com.au  Fact Sheet for Healthcare Providers:  IncredibleEmployment.be  This test is no t yet approved or cleared by the [place] FDA and  has been authorized for detection and/or diagnosis of SARS-CoV-2 by FDA under an Emergency Use Authorization (EUA). This EUA will remain  in effect (meaning this test can be used) for the duration of the COVID-19 declaration under Section 564(b)(1) of the Act, 21 U.S.C.section 360bbb-3(b)(1), unless the authorization is terminated  or revoked sooner.       Influenza A by PCR NEGATIVE NEGATIVE Final   Influenza B by PCR NEGATIVE NEGATIVE Final    Comment: (NOTE) The Xpert Xpress SARS-CoV-2/FLU/RSV plus assay is intended as an aid in the diagnosis of influenza from Nasopharyngeal swab specimens and should not be used as a sole basis for treatment. Nasal washings and aspirates are unacceptable for Xpert Xpress SARS-CoV-2/FLU/RSV testing.  Fact Sheet for Patients: EntrepreneurPulse.com.au  Fact Sheet for Healthcare Providers: IncredibleEmployment.be  This test is not yet approved or cleared by the [place] FDA and has been authorized for detection and/or diagnosis of SARS-CoV-2 by FDA under an Emergency Use Authorization (EUA). This EUA will remain in effect (meaning this test can be used) for the duration of  the COVID-19 declaration under Section 564(b)(1) of the Act, 21 U.S.C. section 360bbb-3(b)(1), unless the authorization is terminated or revoked.  Performed at [facility], [address] [place], [place] [address]      Labs: BNP (last 3 results) No results for input(s): BNP in the last 8760 hours. Basic Metabolic Panel: Recent Labs  Lab 09/06/21 2300 09/08/21 0525  NA 131* 135  K 4.1 3.8  CL 98 105  CO2 26 24  GLUCOSE 105* 112*  BUN 12 15  CREATININE 0.63 0.64  CALCIUM 8.7* 8.1*   Liver Function Tests: Recent Labs  Lab 09/06/21 2300  AST 27  ALT 19  ALKPHOS 69  BILITOT 1.0  PROT 7.0  ALBUMIN 3.4*   Recent Labs  Lab 09/06/21 2300  LIPASE 23   No results for input(s): AMMONIA in the last 168 hours. CBC: Recent Labs  Lab 09/06/21 2300 09/08/21 0525  WBC 10.8* 9.0  NEUTROABS 7.7  --   HGB 12.8 10.8*  HCT 36.8 33.0*  MCV 96.6 100.6*  PLT 278 248   Cardiac Enzymes: No results for input(s): CKTOTAL, CKMB, CKMBINDEX, TROPONINI in the last 168 hours. BNP: Invalid input(s): POCBNP CBG: No results for input(s): GLUCAP in the last 168 hours. D-Dimer No results for input(s): DDIMER in the last 72 hours. Hgb A1c No results for input(s): HGBA1C in the last 72 hours.  Lipid Profile No results for input(s): CHOL, HDL, LDLCALC, TRIG, CHOLHDL, LDLDIRECT in the last 72 hours. Thyroid function studies No results for input(s): TSH, T4TOTAL, T3FREE, THYROIDAB in the last 72 hours.  Invalid input(s): FREET3 Anemia work up Recent Labs    09/09/21 0911  VITAMINB12 344   Urinalysis    Component Value Date/Time   COLORURINE YELLOW 09/07/2021 Justin 09/07/2021 0055   LABSPEC 1.015 09/07/2021 0055   PHURINE 7.0 09/07/2021 0055   GLUCOSEU NEGATIVE 09/07/2021 0055   HGBUR NEGATIVE 09/07/2021 0055   BILIRUBINUR NEGATIVE 09/07/2021 0055   KETONESUR 15 (A) 09/07/2021 0055   PROTEINUR NEGATIVE 09/07/2021 0055   NITRITE NEGATIVE  09/07/2021 0055   LEUKOCYTESUR TRACE (A) 09/07/2021 0055   Sepsis Labs Invalid input(s): PROCALCITONIN,  WBC,  LACTICIDVEN Microbiology Recent Results (from the past 240 hour(s))  Resp Panel by RT-PCR (Flu A&B, Covid) Nasopharyngeal Swab     Status: None   Collection Time: 09/07/21 12:21 AM   Specimen: Nasopharyngeal Swab; Nasopharyngeal(NP) swabs in vial transport medium  Result Value Ref Range Status   SARS Coronavirus 2 by RT PCR NEGATIVE NEGATIVE Final    Comment: (NOTE) SARS-CoV-2 target nucleic acids are NOT DETECTED.  The SARS-CoV-2 RNA is generally detectable in upper respiratory specimens during the acute phase of infection. The lowest concentration of SARS-CoV-2 viral copies this assay can detect is 138 copies/mL. A negative result does not preclude SARS-Cov-2 infection and should not be used as the sole basis for treatment or other patient management decisions. A negative result may occur with  improper specimen collection/handling, submission of specimen other than nasopharyngeal swab, presence of viral mutation(s) within the areas targeted by this assay, and inadequate number of viral copies(<138 copies/mL). A negative result must be combined with clinical observations, patient history, and epidemiological information. The expected result is Negative.  Fact Sheet for Patients:  EntrepreneurPulse.com.au  Fact Sheet for Healthcare Providers:  IncredibleEmployment.be  This test is no t yet approved or cleared by the [place] FDA and  has been authorized for detection and/or diagnosis of SARS-CoV-2 by FDA under an Emergency Use Authorization (EUA). This EUA will remain  in effect (meaning this test can be used) for the duration of the COVID-19 declaration under Section 564(b)(1) of the Act, 21 U.S.C.section 360bbb-3(b)(1), unless the authorization is terminated  or revoked sooner.       Influenza A by PCR NEGATIVE NEGATIVE  Final   Influenza B by PCR NEGATIVE NEGATIVE Final    Comment: (NOTE) The Xpert Xpress SARS-CoV-2/FLU/RSV plus assay is intended as an aid in the diagnosis of influenza from Nasopharyngeal swab specimens and should not be used as a sole basis for treatment. Nasal washings and aspirates are unacceptable for Xpert Xpress SARS-CoV-2/FLU/RSV testing.  Fact Sheet for Patients: EntrepreneurPulse.com.au  Fact Sheet for Healthcare Providers: IncredibleEmployment.be  This test is not yet approved or cleared by the [place] FDA and has been authorized for detection and/or diagnosis of SARS-CoV-2 by FDA under an Emergency Use Authorization (EUA). This EUA will remain in effect (meaning this test can be used) for the duration of the COVID-19 declaration under Section 564(b)(1) of the Act, 21 U.S.C. section 360bbb-3(b)(1), unless the authorization is terminated or revoked.  Performed at [facility], [address], [place], [place] [address]       SIGNED:   Charlynne Cousins, MD  [facility] 09/10/2021, 8:08 AM Pager   If 7PM-7AM, please contact night-coverage [email or web address]  Password [email or web address]

## 2021-09-10 NOTE — TOC Transition Note (Signed)
Transition of Care [facility]) - CM/SW Discharge Note   Patient Details  Name: Carolyn Mckinney MRN: [id number] Date of Birth: 08/05/1945  Transition of Care [facility]) CM/SW Contact:  Ross Ludwig, LCSW Phone Number: 09/10/2021, 12:04 PM   Clinical Narrative:     CSW spoke to [place] at [place], she confirmed patient can discharge today.  Patient to be d/c'ed today to [facility] room 106. Patient and family agreeable to plans will transport via ems RN to call report [phone].  Patient's daughter Mickel Baas was made aware that patient is discharging today.     Final next level of care: Skilled Nursing Facility Barriers to Discharge: Continued Medical Work up, SNF Pending bed offer   Patient Goals and CMS Choice Patient states their goals for this hospitalization and ongoing recovery are:: Agreeable to rehab; prefers somewhere close to daughters home in [place] or work in [employer].gov Compare Post Acute Care list provided to:: Patient Represenative (must comment) Choice offered to / list presented to : Adult Children  Discharge Placement PASRR number recieved: 09/09/21            Patient chooses bed at: [facility] Patient to be transferred to facility by: [facility] Name of family member notified: Patient's daughter Mickel Baas Patient and family notified of of transfer: 09/10/21  Discharge Plan and Services                                     Social Determinants of Health (SDOH) Interventions     Readmission Risk Interventions     View : No data to display.

## 2021-09-11 LAB — FOLATE RBC
Folate, Hemolysate: 415 ng/mL
Folate, RBC: 1305 ng/mL (ref >498)
Hematocrit: 31.8 % — ABNORMAL LOW (ref 34.0–46.6)

## 2021-09-12 ENCOUNTER — Telehealth: Payer: Self-pay | Admitting: Family Medicine

## 2021-09-12 DIAGNOSIS — M5459 Other low back pain: Secondary | ICD-10-CM | POA: Diagnosis not present

## 2021-09-12 DIAGNOSIS — J9601 Acute respiratory failure with hypoxia: Secondary | ICD-10-CM | POA: Diagnosis not present

## 2021-09-12 DIAGNOSIS — I10 Essential (primary) hypertension: Secondary | ICD-10-CM | POA: Diagnosis not present

## 2021-09-12 DIAGNOSIS — Z7409 Other reduced mobility: Secondary | ICD-10-CM | POA: Diagnosis not present

## 2021-09-12 DIAGNOSIS — G8929 Other chronic pain: Secondary | ICD-10-CM | POA: Diagnosis not present

## 2021-09-12 DIAGNOSIS — J189 Pneumonia, unspecified organism: Secondary | ICD-10-CM | POA: Diagnosis not present

## 2021-09-12 DIAGNOSIS — F339 Major depressive disorder, recurrent, unspecified: Secondary | ICD-10-CM | POA: Diagnosis not present

## 2021-09-12 NOTE — Telephone Encounter (Signed)
Received call from [facility] assisted living to schedule [place] f/u appt w/pt's PCP (advised her we are Sports Med/ Ortho for patient not (Primary Care Provider) who needs to f/u w/pt due to Pneumonia dx).  Just an FYI note.  -glh

## 2021-09-14 ENCOUNTER — Telehealth: Payer: Self-pay | Admitting: Family Medicine

## 2021-09-14 NOTE — Telephone Encounter (Signed)
Patient called for Appt,says temporarily in Asst. Living fore rehab.. But says Chronic severe Hip pain --scheduled pt for appt.  --FYI --glh

## 2021-09-15 ENCOUNTER — Ambulatory Visit (INDEPENDENT_AMBULATORY_CARE_PROVIDER_SITE_OTHER): Payer: Medicare Other | Admitting: Family Medicine

## 2021-09-15 ENCOUNTER — Ambulatory Visit: Payer: Self-pay

## 2021-09-15 VITALS — BP 120/64 | Ht 61.0 in | Wt 140.0 lb

## 2021-09-15 DIAGNOSIS — M533 Sacrococcygeal disorders, not elsewhere classified: Secondary | ICD-10-CM | POA: Diagnosis not present

## 2021-09-15 DIAGNOSIS — G8929 Other chronic pain: Secondary | ICD-10-CM

## 2021-09-15 MED ORDER — TRIAMCINOLONE ACETONIDE 40 MG/ML IJ SUSP
40.0000 mg | Freq: Once | INTRAMUSCULAR | Status: AC
  Administered 2021-09-15: 40 mg via INTRA_ARTICULAR

## 2021-09-15 NOTE — Patient Instructions (Signed)
Good to see you Please continue with physical therapy  Please send me a message in MyChart with any questions or updates.  Please see me back in 4 weeks.   --Dr. Raeford Razor

## 2021-09-15 NOTE — Assessment & Plan Note (Signed)
Acutely occurring.  Having pain more in the SI joint.  Likely has component of lumbar strain as well. -Counseled on home exercise therapy and supportive care. -SI joint injection. -Continue physical therapy.

## 2021-09-15 NOTE — Progress Notes (Signed)
  Carolyn Mckinney - 76 y.o. female MRN [id number]  Date of birth: 10/05/1945  SUBJECTIVE:  Including CC & ROS.  No chief complaint on file.   Carolyn Mckinney is a 76 y.o. female that is presenting with acute worsening of her SI joint pain.  She was recently admitted for pneumonia.  She has been transition to a nursing facility.  She has been in physical therapy and using a walker to help with the pain.   Review of Systems See HPI   HISTORY: Past Medical, Surgical, Social, and Family History Reviewed & Updated per EMR.   Pertinent Historical Findings include:  Past Medical History:  Diagnosis Date   Allergy    Cancer ([place])    Breast   Cataract    cataract repair bilaterally   Closed patellar sleeve fracture of right knee 05/2019   Hypertension    Meniere's disease    Personal history of radiation therapy 2001   Left Breast Cancer    Past Surgical History:  Procedure Laterality Date   BREAST BIOPSY Left 2001   BREAST LUMPECTOMY Left 01/25/2000   COLONOSCOPY  2006   VAGINAL HYSTERECTOMY  1991   Done for bleeding. Benign pathology     PHYSICAL EXAM:  VS: BP 120/64   Ht '5\' 1"'$  (1.549 m)   Wt 140 lb (63.5 kg)   BMI 26.45 kg/m  Physical Exam Gen: NAD, alert, cooperative with exam, well-appearing MSK:  Neurovascularly intact     Aspiration/Injection Procedure Note Carolyn Mckinney 1945/11/28  Procedure: Injection Indications: Right SI joint pain  Procedure Details Consent: Risks of procedure as well as the alternatives and risks of each were explained to the (patient/caregiver).  Consent for procedure obtained. Time Out: Verified patient identification, verified procedure, site/side was marked, verified correct patient position, special equipment/implants available, medications/allergies/relevent history reviewed, required imaging and test results available.  Performed.  The area was cleaned with iodine and alcohol swabs.    The right SI joint was injected using 3 cc  of 1% lidocaine on a 22-gauge 3-1/2 inch needle.  The syringe was switched and a mixture containing 1 cc's of 40 mg Kenalog and 4 cc's of 0.5% bupivacaine was injected.  The ultrasound was needed to visualize the needle endpoint within the joint.   Ultrasound was used. Images were obtained in long views showing the injection.     A sterile dressing was applied.  Patient did tolerate procedure well.     ASSESSMENT & PLAN:   Chronic right SI joint pain Acutely occurring.  Having pain more in the SI joint.  Likely has component of lumbar strain as well. -Counseled on home exercise therapy and supportive care. -SI joint injection. -Continue physical therapy.

## 2021-09-22 DIAGNOSIS — Z79899 Other long term (current) drug therapy: Secondary | ICD-10-CM | POA: Diagnosis not present

## 2021-09-22 DIAGNOSIS — K130 Diseases of lips: Secondary | ICD-10-CM | POA: Diagnosis not present

## 2021-09-22 DIAGNOSIS — L700 Acne vulgaris: Secondary | ICD-10-CM | POA: Diagnosis not present

## 2021-09-22 DIAGNOSIS — L820 Inflamed seborrheic keratosis: Secondary | ICD-10-CM | POA: Diagnosis not present

## 2021-09-22 DIAGNOSIS — I781 Nevus, non-neoplastic: Secondary | ICD-10-CM | POA: Diagnosis not present

## 2021-09-26 ENCOUNTER — Telehealth: Payer: Self-pay | Admitting: Family Medicine

## 2021-09-26 NOTE — Telephone Encounter (Signed)
Yes she needs a followup

## 2021-09-26 NOTE — Telephone Encounter (Signed)
Ronalee Belts [facility] called wanting to know if the pt is exiting a skilled nursing facility, does the pt need a follow up appt with her PCP to update them with her condition.

## 2021-09-26 NOTE — Telephone Encounter (Signed)
Ronalee Belts [facility]) was called to advise him of her needing a follow up appt. He asked if we could reach out to the pt to schedule that appt.

## 2021-10-02 ENCOUNTER — Other Ambulatory Visit: Payer: Self-pay | Admitting: Neurology

## 2021-10-03 ENCOUNTER — Other Ambulatory Visit: Payer: Self-pay

## 2021-10-03 DIAGNOSIS — H8103 Meniere's disease, bilateral: Secondary | ICD-10-CM

## 2021-10-03 DIAGNOSIS — R202 Paresthesia of skin: Secondary | ICD-10-CM

## 2021-10-03 DIAGNOSIS — I10 Essential (primary) hypertension: Secondary | ICD-10-CM

## 2021-10-03 MED ORDER — DULOXETINE HCL 60 MG PO CPEP: 60.0000 mg | ORAL_CAPSULE | Freq: Every day | ORAL | 1 refills | Status: DC

## 2021-10-04 ENCOUNTER — Ambulatory Visit (INDEPENDENT_AMBULATORY_CARE_PROVIDER_SITE_OTHER): Payer: Medicare Other | Admitting: Family Medicine

## 2021-10-04 ENCOUNTER — Encounter: Payer: Self-pay | Admitting: Family Medicine

## 2021-10-04 VITALS — BP 110/76 | HR 60 | Temp 97.9°F | Ht 61.0 in | Wt 139.1 lb

## 2021-10-04 DIAGNOSIS — M545 Low back pain, unspecified: Secondary | ICD-10-CM

## 2021-10-04 DIAGNOSIS — T50905A Adverse effect of unspecified drugs, medicaments and biological substances, initial encounter: Secondary | ICD-10-CM

## 2021-10-04 MED ORDER — HYDROCODONE-ACETAMINOPHEN 5-325 MG PO TABS: 1.0000 | ORAL_TABLET | Freq: Four times a day (QID) | ORAL | 0 refills | Status: DC | PRN

## 2021-10-04 NOTE — Patient Instructions (Addendum)
Do not drink alcohol, do any illicit/street drugs, drive or do anything that requires alertness while on this medicine.   I would hold the Accutane until you hear from your dermatologist.   Continue the home exercise plan from PT.   Let me know if you need a refill.   Consider Voltaren gel.   Let us know if you need anything.

## 2021-10-04 NOTE — Progress Notes (Signed)
Chief Complaint  Patient presents with   Hospitalization Follow-up    Subjective: Patient is a 76 y.o. female here for hosp f/u.  She is here with her daughter.  Patient was admitted to [facility] on 5/16 and discharged on 5/20 to SNF.  She was treated for a lower lobe pneumonia.  She defervesced on Rocephin and azithromycin and was transitioned to oral medication upon discharge to SNF.  At SNF, she worked with physical therapy to help with SI joint pain and lower back pain.  It slightly helped but she continues to struggle with low back pain.  She is weaning down on hydrocodone and she does not want to get addicted to this.  She continues to take Accutane daily and has reached out to her dermatologist to see if this could play a role in her pain.  The pain started without any injury or change in activity and was around 2 months after starting the Accutane.  Past Medical History:  Diagnosis Date   Allergy    Cancer [facility])    Breast   Cataract    cataract repair bilaterally   Closed patellar sleeve fracture of right knee 05/2019   Hypertension    Meniere's disease    Personal history of radiation therapy 2001   Left Breast Cancer    Objective: BP 110/76   Pulse 60   Temp 97.9 F (36.6 C) (Oral)   Ht '5\' 1"'$  (1.549 m)   Wt 139 lb 2 oz (63.1 kg)   SpO2 94%   BMI 26.29 kg/m  General: Awake, appears stated age Heart: RRR, no LE edema Lungs: CTAB, no rales, wheezes or rhonchi. No accessory muscle use MSK: TTP over the SI joints bilaterally and lumbar paraspinal musculature Neuro: Gait is antalgic, DTRs equal and symmetric in the lower extremities without clonus Psych: Age appropriate judgment and insight, normal affect and mood  Assessment and Plan: Adverse effect of drug, initial encounter  Bilateral low back pain without sciatica, unspecified chronicity  Adverse effect of chronic medication.  She has reached out to her dermatologist regarding this possible side effect of  Accutane.  We looked up side effects for the medication and it does report within 1 week and 3 months, muscle pain being a more severe side effects.  She took her morning dose of Accutane today, I will have her hold this medication until she is able to hear from her dermatologist.  I will continue intermittent use of hydrocodone until this resolves.  Continue the home exercise program.  Ice, heat, Tylenol. Follow-up as originally scheduled. The patient voiced understanding and agreement to the plan.  Maurice, DO 10/04/21  3:32 PM

## 2021-10-05 ENCOUNTER — Telehealth: Payer: Self-pay | Admitting: Family Medicine

## 2021-10-05 NOTE — Telephone Encounter (Signed)
HH wanted to make pcp aware pt has refused services and states she is independent and does not need the help.

## 2021-10-14 ENCOUNTER — Telehealth: Payer: Self-pay | Admitting: Family Medicine

## 2021-10-14 DIAGNOSIS — M792 Neuralgia and neuritis, unspecified: Secondary | ICD-10-CM

## 2021-10-14 NOTE — Telephone Encounter (Signed)
PDMP okay, Rx sent 

## 2021-10-14 NOTE — Telephone Encounter (Signed)
Requesting: Lyrica 150mg   Contract: None UDS: None Last Visit: 07/05/21 Next Visit: None Last Refill: 06/21/21 #180 and 0RF  Please Advise

## 2021-10-19 ENCOUNTER — Ambulatory Visit (INDEPENDENT_AMBULATORY_CARE_PROVIDER_SITE_OTHER): Payer: Medicare Other | Admitting: Family Medicine

## 2021-10-19 ENCOUNTER — Encounter: Payer: Self-pay | Admitting: Family Medicine

## 2021-10-19 ENCOUNTER — Ambulatory Visit: Payer: Self-pay

## 2021-10-19 VITALS — BP 124/76 | Ht 61.0 in | Wt 135.0 lb

## 2021-10-19 DIAGNOSIS — M533 Sacrococcygeal disorders, not elsewhere classified: Secondary | ICD-10-CM | POA: Diagnosis not present

## 2021-10-19 DIAGNOSIS — G8929 Other chronic pain: Secondary | ICD-10-CM | POA: Diagnosis not present

## 2021-10-19 DIAGNOSIS — M1712 Unilateral primary osteoarthritis, left knee: Secondary | ICD-10-CM | POA: Diagnosis not present

## 2021-10-19 MED ORDER — TRIAMCINOLONE ACETONIDE 32 MG IX SRER
32.0000 mg | Freq: Once | INTRA_ARTICULAR | Status: AC
  Administered 2021-10-19: 32 mg via INTRA_ARTICULAR

## 2021-10-19 MED ORDER — TRIAMCINOLONE ACETONIDE 40 MG/ML IJ SUSP
40.0000 mg | Freq: Once | INTRAMUSCULAR | Status: AC
  Administered 2021-10-19: 40 mg via INTRA_ARTICULAR

## 2021-10-19 MED ORDER — KETOROLAC TROMETHAMINE 30 MG/ML IJ SOLN
30.0000 mg | Freq: Once | INTRAMUSCULAR | Status: AC
  Administered 2021-10-19: 30 mg via INTRAMUSCULAR

## 2021-10-19 NOTE — Progress Notes (Unsigned)
Kenton Kingfisher - 76 y.o. female MRN [id number]  Date of birth: Dec 23, 1945  SUBJECTIVE:  Including CC & ROS.  No chief complaint on file.   Nikkie Liming is a 76 y.o. female that is presenting with acute on chronic bilateral SI joint pain as well as left knee pain..    Review of Systems See HPI   HISTORY: Past Medical, Surgical, Social, and Family History Reviewed & Updated per EMR.   Pertinent Historical Findings include:  Past Medical History:  Diagnosis Date   Allergy    Cancer ([place])    Breast   Cataract    cataract repair bilaterally   Closed patellar sleeve fracture of right knee 05/2019   Hypertension    Meniere's disease    Personal history of radiation therapy 2001   Left Breast Cancer    Past Surgical History:  Procedure Laterality Date   BREAST BIOPSY Left 2001   BREAST LUMPECTOMY Left 01/25/2000   COLONOSCOPY  2006   VAGINAL HYSTERECTOMY  1991   Done for bleeding. Benign pathology     PHYSICAL EXAM:  VS: BP 124/76 (BP Location: Left Arm, Patient Position: Sitting, Cuff Size: Normal)   Ht '5\' 1"'$  (1.549 m)   Wt 135 lb (61.2 kg)   BMI 25.51 kg/m  Physical Exam Gen: NAD, alert, cooperative with exam, well-appearing MSK:  Neurovascularly intact     Aspiration/Injection Procedure Note Kimetha Trulson 17-Oct-1945  Procedure: Injection Indications: Left knee pain  Procedure Details Consent: Risks of procedure as well as the alternatives and risks of each were explained to the (patient/caregiver).  Consent for procedure obtained. Time Out: Verified patient identification, verified procedure, site/side was marked, verified correct patient position, special equipment/implants available, medications/allergies/relevent history reviewed, required imaging and test results available.  Performed.  The area was cleaned with iodine and alcohol swabs.    The left knee superior lateral suprapatellar pouch was injected using 3 cc of 1% lidocaine on a 22-gauge  1-1/2 inch needle.  The syringe was switched and a mixture containing 1 cc's of 40 mg Kenalog and 4 cc's of 0.25% bupivacaine was injected.  Ultrasound was used. Images were obtained in long views showing the injection.     A sterile dressing was applied.  Patient did tolerate procedure well.   Aspiration/Injection Procedure Note Xela Oregel 1945/08/09  Procedure: Injection Indications: Left SI joint pain  Procedure Details Consent: Risks of procedure as well as the alternatives and risks of each were explained to the (patient/caregiver).  Consent for procedure obtained. Time Out: Verified patient identification, verified procedure, site/side was marked, verified correct patient position, special equipment/implants available, medications/allergies/relevent history reviewed, required imaging and test results available.  Performed.  The area was cleaned with iodine and alcohol swabs.    The left SI joint  injected using 3 cc of 1% lidocaine on a 22-gauge 3-1/2 inch needle..  The syringe was switched and a mixture containing 5 cc's of 32 mg Zilretta and 4 cc's of 0.25% bupivacaine was injected.  Ultrasound was needed for visualization of the needle at the endpoint in the joint.  Ultrasound was used. Images were obtained in long views showing the injection.    A sterile dressing was applied.  Patient did tolerate procedure well.   Aspiration/Injection Procedure Note Abbeygail Igoe [birth year]  Procedure: Injection Indications: Right SI joint pain  Procedure Details Consent: Risks of procedure as well as the alternatives and risks of each were explained to the (patient/caregiver).  Consent for procedure  obtained. Time Out: Verified patient identification, verified procedure, site/side was marked, verified correct patient position, special equipment/implants available, medications/allergies/relevent history reviewed, required imaging and test results available.  Performed.  The area  was cleaned with iodine and alcohol swabs.    The right SI joint was injected using 3 cc of 1% lidocaine on a 22-gauge 3-1/2 inch needle.  The syringe was switched to mixture containing 1 cc's of 30 mg Toradol and 4 cc's of 0.25% bupivacaine was injected.  Ultrasound was needed for visualization of the endpoint of the needle and joint.  Ultrasound was used. Images were obtained in long views showing the injection.     A sterile dressing was applied.  Patient did tolerate procedure well.        ASSESSMENT & PLAN:   Primary osteoarthritis of left knee Acute on chronic in nature.  Mild effusion on exam today. -Counseled on home exercise therapy and supportive care. -Injection. -Could consider PRP.  Sacroiliac joint dysfunction of left side Acute on chronic in nature.  Having worse pain with rotation. -Counseled on home exercise therapy and supportive care. -Zilretta joint injection.  Chronic right SI joint pain Acute on chronic in nature.  Has exacerbated here recently.  Has tried physical therapy in the past.  Has done well with previous injections. -Counseled on home exercise therapy and supportive care. -Toradol joint injection.

## 2021-10-19 NOTE — Patient Instructions (Signed)
Good to see you Please use ice as needed   Please send me a message in MyChart with any questions or updates.  Please see me back in 6-8 weeks.   --Dr. Coby Antrobus  

## 2021-10-19 NOTE — Assessment & Plan Note (Signed)
Acute on chronic in nature.  Mild effusion on exam today. -Counseled on home exercise therapy and supportive care. -Injection. -Could consider PRP.

## 2021-10-19 NOTE — Assessment & Plan Note (Addendum)
Acute on chronic in nature.  Having worse pain with rotation. -Counseled on home exercise therapy and supportive care. -Zilretta joint injection.

## 2021-10-19 NOTE — Assessment & Plan Note (Signed)
Acute on chronic in nature.  Has exacerbated here recently.  Has tried physical therapy in the past.  Has done well with previous injections. -Counseled on home exercise therapy and supportive care. -Toradol joint injection.

## 2021-10-20 ENCOUNTER — Other Ambulatory Visit: Payer: Self-pay | Admitting: Family Medicine

## 2021-12-06 ENCOUNTER — Ambulatory Visit (INDEPENDENT_AMBULATORY_CARE_PROVIDER_SITE_OTHER): Payer: Medicare Other | Admitting: Family Medicine

## 2021-12-06 ENCOUNTER — Encounter: Payer: Self-pay | Admitting: Family Medicine

## 2021-12-06 ENCOUNTER — Ambulatory Visit (HOSPITAL_BASED_OUTPATIENT_CLINIC_OR_DEPARTMENT_OTHER)
Admission: RE | Admit: 2021-12-06 | Discharge: 2021-12-06 | Disposition: A | Discharge status: Home / Self Care | Payer: Medicare Other | Source: Ambulatory Visit | Attending: Family Medicine | Admitting: Family Medicine

## 2021-12-06 VITALS — Ht 61.0 in | Wt 135.0 lb

## 2021-12-06 DIAGNOSIS — Z79899 Other long term (current) drug therapy: Secondary | ICD-10-CM | POA: Diagnosis not present

## 2021-12-06 DIAGNOSIS — L700 Acne vulgaris: Secondary | ICD-10-CM | POA: Diagnosis not present

## 2021-12-06 DIAGNOSIS — M47816 Spondylosis without myelopathy or radiculopathy, lumbar region: Secondary | ICD-10-CM | POA: Diagnosis not present

## 2021-12-06 DIAGNOSIS — M4316 Spondylolisthesis, lumbar region: Secondary | ICD-10-CM | POA: Diagnosis not present

## 2021-12-06 DIAGNOSIS — M545 Low back pain, unspecified: Secondary | ICD-10-CM | POA: Diagnosis not present

## 2021-12-06 MED ORDER — MELOXICAM 7.5 MG PO TABS: 7.5000 mg | ORAL_TABLET | Freq: Every day | ORAL | 3 refills | Status: DC | PRN

## 2021-12-06 MED ORDER — KETOROLAC TROMETHAMINE 30 MG/ML IJ SOLN
30.0000 mg | Freq: Once | INTRAMUSCULAR | Status: AC
  Administered 2021-12-06: 30 mg via INTRAMUSCULAR

## 2021-12-06 NOTE — Progress Notes (Signed)
  Carolyn Mckinney - 76 y.o. female MRN [id number]  Date of birth: [birth year]  SUBJECTIVE:  Including CC & ROS.  No chief complaint on file.   Carolyn Mckinney is a 76 y.o. female that is presenting with low back pain following 2 different falls while she was on her trip.  She had a fall forward as well as a fall backwards it was mechanical in nature.  Now having acute on chronic low back pain with no significant radicular pain at this time.    Review of Systems See HPI   HISTORY: Past Medical, Surgical, Social, and Family History Reviewed & Updated per EMR.   Pertinent Historical Findings include:  Past Medical History:  Diagnosis Date   Allergy    Cancer ([place])    Breast   Cataract    cataract repair bilaterally   Closed patellar sleeve fracture of right knee 05/2019   Hypertension    Meniere's disease    Personal history of radiation therapy 2001   Left Breast Cancer    Past Surgical History:  Procedure Laterality Date   BREAST BIOPSY Left 2001   BREAST LUMPECTOMY Left 01/25/2000   COLONOSCOPY  2006   VAGINAL HYSTERECTOMY  1991   Done for bleeding. Benign pathology     PHYSICAL EXAM:  VS: Ht '5\' 1"'$  (1.549 m)   Wt 135 lb (61.2 kg)   BMI 25.51 kg/m  Physical Exam Gen: NAD, alert, cooperative with exam, well-appearing MSK:  Neurovascularly intact       ASSESSMENT & PLAN:   Facet hypertrophy of lumbar region Acute on chronic in nature.  She does have degenerative changes of the facet joints that are likely contributing to the current pain that she is experiencing. -Counseled on home exercise therapy and supportive care. -IM Toradol. -Facet injections at right and left L4-L5 and L5-S1 .-Meloxicam. -Counseled on taking a holiday from Accutane to see if that improves her arthralgia. -Could consider epidural. -X-ray

## 2021-12-06 NOTE — Patient Instructions (Signed)
Good to see you Please use heat  I will call with the results.  I have sent the order for facet injection.  The accuaten can cause joint pain so it may be worth taking a holiday from it before your injections.  Please send me a message in MyChart with any questions or updates.  Please give [place] a call a week after your injections.   --Dr. Raeford Razor

## 2021-12-06 NOTE — Assessment & Plan Note (Signed)
Acute on chronic in nature.  She does have degenerative changes of the facet joints that are likely contributing to the current pain that she is experiencing. -Counseled on home exercise therapy and supportive care. -IM Toradol. -Facet injections at right and left L4-L5 and L5-S1 .-Meloxicam. -Counseled on taking a holiday from Accutane to see if that improves her arthralgia. -Could consider epidural. -X-ray

## 2021-12-09 ENCOUNTER — Telehealth: Payer: Self-pay | Admitting: Family Medicine

## 2021-12-09 NOTE — Telephone Encounter (Signed)
Informed of results.   Rosemarie Ax, MD [facility] 12/09/2021, 12:03 PM

## 2021-12-16 ENCOUNTER — Other Ambulatory Visit: Payer: Self-pay | Admitting: Family Medicine

## 2021-12-16 DIAGNOSIS — M47816 Spondylosis without myelopathy or radiculopathy, lumbar region: Secondary | ICD-10-CM

## 2021-12-16 NOTE — Addendum Note (Signed)
Addended by: Cresenciano Lick on: 12/16/2021 10:17 AM   Modules accepted: Orders

## 2021-12-20 DIAGNOSIS — Z23 Encounter for immunization: Secondary | ICD-10-CM | POA: Diagnosis not present

## 2021-12-22 ENCOUNTER — Other Ambulatory Visit: Payer: Self-pay | Admitting: Family Medicine

## 2021-12-27 ENCOUNTER — Ambulatory Visit
Admission: RE | Admit: 2021-12-27 | Discharge: 2021-12-27 | Disposition: A | Discharge status: Home / Self Care | Payer: Medicare Other | Source: Ambulatory Visit | Attending: Family Medicine | Admitting: Family Medicine

## 2021-12-27 DIAGNOSIS — M47816 Spondylosis without myelopathy or radiculopathy, lumbar region: Secondary | ICD-10-CM

## 2021-12-27 DIAGNOSIS — M4696 Unspecified inflammatory spondylopathy, lumbar region: Secondary | ICD-10-CM | POA: Diagnosis not present

## 2021-12-27 MED ORDER — IOPAMIDOL (ISOVUE-M 200) INJECTION 41%
1.0000 mL | Freq: Once | INTRAMUSCULAR | Status: AC
  Administered 2021-12-27: 1 mL via INTRA_ARTICULAR

## 2021-12-27 MED ORDER — METHYLPREDNISOLONE ACETATE 40 MG/ML INJ SUSP (RADIOLOG
80.0000 mg | Freq: Once | INTRAMUSCULAR | Status: AC
  Administered 2021-12-27: 80 mg via INTRA_ARTICULAR

## 2021-12-27 NOTE — Discharge Instructions (Signed)

## 2022-01-09 DIAGNOSIS — L700 Acne vulgaris: Secondary | ICD-10-CM | POA: Diagnosis not present

## 2022-01-09 DIAGNOSIS — Z79899 Other long term (current) drug therapy: Secondary | ICD-10-CM | POA: Diagnosis not present

## 2022-01-09 DIAGNOSIS — M6740 Ganglion, unspecified site: Secondary | ICD-10-CM | POA: Diagnosis not present

## 2022-01-23 ENCOUNTER — Other Ambulatory Visit: Payer: Self-pay | Admitting: Internal Medicine

## 2022-01-23 DIAGNOSIS — M792 Neuralgia and neuritis, unspecified: Secondary | ICD-10-CM

## 2022-01-24 NOTE — Telephone Encounter (Signed)
Requesting: Lyrica '150mg'$   Contract: None UDS: None Last Visit: 10/04/21 Next Visit: None Last Refill: 10/14/21 #180 and 0RF  Please Advise

## 2022-02-10 ENCOUNTER — Encounter: Payer: Self-pay | Admitting: Family Medicine

## 2022-02-20 ENCOUNTER — Other Ambulatory Visit (HOSPITAL_BASED_OUTPATIENT_CLINIC_OR_DEPARTMENT_OTHER): Payer: Self-pay | Admitting: Family Medicine

## 2022-02-20 DIAGNOSIS — Z1231 Encounter for screening mammogram for malignant neoplasm of breast: Secondary | ICD-10-CM

## 2022-02-23 NOTE — Progress Notes (Signed)
Follow-up Visit   Date: 02/24/22   Carolyn Mckinney MRN: [id number] DOB: [birth year]   Interim History: Carolyn Mckinney is a 76 y.o. left-handed Caucasian female returning to the clinic for follow-up of bilateral feet pain.  The patient was accompanied to the clinic by self.  IMPRESSION/PLAN: Bilateral feet paresthesias and pain. No evidence of neuropathy on EMG  - Previously tried;  gabapentin, nortriptlyine, venlafaxie  - Continue Cymbalta '60mg'$  daily  - Continue Lyrica '150mg'$  BID as prescribed by PCP.    - Due to refractory pain, pain management referral was offered. She will contact us, if she chooses to proceed with this.   Myalgias and polyarthralgias  - Follow-up with PCP  Return to clinic in 1 year  --------------------------------------------------------- UPDATE 08/26/2020:  She is here requesting refills for Cymbalta as she will be traveling to [place] this summer for three months.  She is leaving in June.  She has been out of her Cymbalta for the past 3-4 weeks.  She has noticed increased cold sensation and pain.  She is also taking Lyrica '150mg'$  BID which is prescribed by her PCP. Pain seems to be controlled on this regimen.    Her NCS/EMG was normal. She has tried gabapentin, nortriptyline, and venlafaxine with no benefit.   UPDATE 02/24/2022:  She is here for follow-up visit.  She continues to take Cymbalta '60mg'$  daily and Lyrica '150mg'$  (prescribed by PCP).  She still has painful sensation in the feet.  She feels that Cymbalta has helped but also describes feeling woosy at times. She complains of generalized muscle and joint pain.    Medications:  Current Outpatient Medications on File Prior to Visit  Medication Sig Dispense Refill   amLODipine (NORVASC) 5 MG tablet Take 1 tablet by mouth once daily 90 tablet 0   Calcium Carb-Cholecalciferol (CALCIUM + D3 PO) Take 1 tablet by mouth daily.     Ferrous Sulfate (IRON PO) Take 1 tablet by mouth daily.      HYDROcodone-acetaminophen (NORCO/VICODIN) 5-325 MG tablet Take 1 tablet by mouth every 6 (six) hours as needed for moderate pain. 30 tablet 0   losartan (COZAAR) 100 MG tablet Take 1 tablet (100 mg total) by mouth daily. 90 tablet 0   meloxicam (MOBIC) 7.5 MG tablet Take 1 tablet (7.5 mg total) by mouth daily as needed for pain. 30 tablet 3   Multiple Vitamin (MULTIVITAMIN ADULT PO) Take 1 tablet by mouth daily.     pregabalin (LYRICA) 150 MG capsule Take 1 capsule by mouth twice daily 180 capsule 1   PSEUDOEPHEDRINE-DM PO Take 1 tablet by mouth daily.     traZODone (DESYREL) 50 MG tablet TAKE 1 TO 2 TABLETS BY MOUTH AT BEDTIME AS NEEDED FOR SLEEP (Patient taking differently: Take 50-100 mg by mouth at bedtime.) 180 tablet 2   valACYclovir (VALTREX) 500 MG tablet TAKE 4 TABLETS BY MOUTH AT ONSET OF BREAKOUT AND REPEAT IN 12 HOURS. OTHERWISE, TAKE 1 TABLET BY MOUTH ONCE DAILY (Patient taking differently: Take 500 mg by mouth daily. ALSO TAKE 4 TABLETS BY MOUTH AT ONSET OF BREAKOUT AND REPEAT IN 12 HOURS. OTHERWISE, TAKE 1 TABLET BY MOUTH ONCE DAILY.) 90 tablet 0   VITAMIN K PO Take 1 tablet by mouth daily.     zinc gluconate 50 MG tablet Take 50 mg by mouth daily.     Zoster Vaccine Adjuvanted [facility]) injection Inject into the muscle. 0.5 mL 1   ISOtretinoin (ACCUTANE) 40 MG capsule Take 40 mg by  mouth 2 (two) times daily. (Patient not taking: Reported on 02/24/2022)     No current facility-administered medications on file prior to visit.    Allergies: No Known Allergies  Vital Signs:  BP 131/77   Pulse 67   Ht '5\' 1"'$  (1.549 m)   Wt 147 lb (66.7 kg)   SpO2 97%   BMI 27.78 kg/m   Neurological Exam: MENTAL STATUS including orientation to time, place, person is normal.  Speech is slow and there is delayed throught process and hesitant speech. No dysarthria  CRANIAL NERVES:  Pupils equal round and reactive to light.  Normal conjugate, extra-ocular eye movements in all directions of gaze.   No ptosis.    MOTOR:  Motor strength is 5/5 in all extremities, including distally in the feet.  No atrophy, fasciculations or abnormal movements.  No pronator drift.  Tone is normal.    MSRs:  Reflexes are 2+/4 throughout.  SENSORY:  Intact to vibration throughout.  COORDINATION/GAIT:  Gait is unassisted, nonphysiologic   Data: NCS/EMG bilateral legs 06/08/2018:  Normal    Thank you for allowing me to participate in patient's care.  If I can answer any additional questions, I would be pleased to do so.    Sincerely,    Isidro Monks K. Posey Pronto, DO

## 2022-02-24 ENCOUNTER — Encounter: Payer: Self-pay | Admitting: Neurology

## 2022-02-24 ENCOUNTER — Ambulatory Visit (INDEPENDENT_AMBULATORY_CARE_PROVIDER_SITE_OTHER): Payer: Medicare Other | Admitting: Neurology

## 2022-02-24 DIAGNOSIS — R202 Paresthesia of skin: Secondary | ICD-10-CM | POA: Diagnosis not present

## 2022-02-24 DIAGNOSIS — H8103 Meniere's disease, bilateral: Secondary | ICD-10-CM

## 2022-02-24 DIAGNOSIS — I10 Essential (primary) hypertension: Secondary | ICD-10-CM | POA: Diagnosis not present

## 2022-02-24 MED ORDER — DULOXETINE HCL 60 MG PO CPEP: 60.0000 mg | ORAL_CAPSULE | Freq: Every day | ORAL | 3 refills | Status: DC

## 2022-02-24 NOTE — Patient Instructions (Signed)
Return to [place] in 1 year

## 2022-02-27 ENCOUNTER — Other Ambulatory Visit: Payer: Self-pay | Admitting: Family Medicine

## 2022-02-28 DIAGNOSIS — K120 Recurrent oral aphthae: Secondary | ICD-10-CM | POA: Diagnosis not present

## 2022-02-28 DIAGNOSIS — L700 Acne vulgaris: Secondary | ICD-10-CM | POA: Diagnosis not present

## 2022-02-28 DIAGNOSIS — Z79899 Other long term (current) drug therapy: Secondary | ICD-10-CM | POA: Diagnosis not present

## 2022-02-28 DIAGNOSIS — E663 Overweight: Secondary | ICD-10-CM | POA: Diagnosis not present

## 2022-02-28 DIAGNOSIS — B009 Herpesviral infection, unspecified: Secondary | ICD-10-CM | POA: Diagnosis not present

## 2022-03-07 ENCOUNTER — Other Ambulatory Visit (HOSPITAL_BASED_OUTPATIENT_CLINIC_OR_DEPARTMENT_OTHER): Payer: Self-pay

## 2022-03-07 ENCOUNTER — Ambulatory Visit (HOSPITAL_BASED_OUTPATIENT_CLINIC_OR_DEPARTMENT_OTHER)
Admission: RE | Admit: 2022-03-07 | Discharge: 2022-03-07 | Disposition: A | Discharge status: Home / Self Care | Payer: Medicare Other | Source: Ambulatory Visit | Attending: Family Medicine | Admitting: Family Medicine

## 2022-03-07 ENCOUNTER — Encounter (HOSPITAL_BASED_OUTPATIENT_CLINIC_OR_DEPARTMENT_OTHER): Payer: Self-pay

## 2022-03-07 DIAGNOSIS — Z1231 Encounter for screening mammogram for malignant neoplasm of breast: Secondary | ICD-10-CM | POA: Diagnosis not present

## 2022-03-07 DIAGNOSIS — Z23 Encounter for immunization: Secondary | ICD-10-CM | POA: Diagnosis not present

## 2022-03-07 MED ORDER — COMIRNATY 30 MCG/0.3ML IM SUSY
PREFILLED_SYRINGE | INTRAMUSCULAR | 0 refills | Status: DC
  Filled 2022-03-07: qty 0.3, 1d supply, fill #0

## 2022-03-21 ENCOUNTER — Encounter: Payer: Self-pay | Admitting: Family Medicine

## 2022-03-21 ENCOUNTER — Telehealth: Payer: Self-pay | Admitting: Family Medicine

## 2022-03-21 ENCOUNTER — Telehealth (INDEPENDENT_AMBULATORY_CARE_PROVIDER_SITE_OTHER): Payer: Medicare Other | Admitting: Family Medicine

## 2022-03-21 VITALS — Ht 61.0 in | Wt 146.0 lb

## 2022-03-21 DIAGNOSIS — Z0000 Encounter for general adult medical examination without abnormal findings: Secondary | ICD-10-CM | POA: Diagnosis not present

## 2022-03-21 NOTE — Patient Instructions (Addendum)
I really enjoyed getting to talk with you today! I am available on Tuesdays and Thursdays for virtual visits if you have any questions or concerns, or if I can be of any further assistance.   CHECKLIST FROM ANNUAL WELLNESS VISIT:  -Follow up:  -Inperson visit with your Primary Doctor office in next few days to address leg issues and medication concerns -yearly for annual wellness visit with primary care office  Here is a list of your preventive care/health maintenance measures and the plan for each:  Health Maintenance  Topic Date Due   COVID-19 Vaccine (7 - 2023-24 season) 05/02/2022   Medicare Annual Wellness (AWV)  03/22/2023   Pneumonia Vaccine 45+ Years old  Completed   INFLUENZA VACCINE  Completed   DEXA SCAN  You had mild bone weakness/loss on your last report and you are due for another. Please discuss with Dr. Nani Ravens as discussed. Calcium, vitamin D, weight bearing exercise, a healthy diet and some medications can help.   Hepatitis C Screening  Completed   Zoster Vaccines- Shingrix  Completed   HPV VACCINES  Aged Out   COLONOSCOPY (Pts 45-55yr Insurance coverage will need to be confirmed)  Discontinued    -See a dentist at least yearly  -Get your eyes checked per your eye specialist's recommendations  -Other issues addressed today: -healthy whole food plant heavy diet (see below for details) -exercise guidelines for adults (see below) -balance exercises to reduce falls/injury - see below -alcohol recommendations for women: no more than 1 drink per day, no more than 7 drinks per week.    -I have included below further information regarding a healthy whole foods based diet, physical activity guidlelines, stress management and opportunities for social connections. I hope you find this information useful.      FOOD - THE FUEL FOR A HAPPY HEALTHY LIFE: -eat real food: lots of colorful vegetables (half the plate) -consume on a regular basis: whole grains (make sure first ingredient on label contains the word "whole"), fresh fruits, fish, nuts, seeds, healthy oils (such as olive oil, avocado oil, grape seed oil) -may eat small amounts of dairy and lean meat on occasion, but avoid processed meats such as ham, bacon, lunch meat, etc. -drink water -try to avoid fast food and pre-packaged foods, processed meat -try to avoid foods that contain any ingredients with names you do not recognize  -try to avoid sugar/sweets (except for the natural sugar that occurs in fresh fruit) -try to avoid sweet drinks -try to avoid white rice, white bread, pasta (unless whole grain), white or yellow potatoes  MOVE - the key to keeping your body moving and working best: -gradually increase intentional physical activity -move and stretch your body, legs, feet and arms when sitting for long periods -try to get at least 150 minutes per week os sweaty exercise once approved by your doctor, 20-30 minutes of sustained activity or two 10 minute episodes of sustained activity every day.  -recommend strength training or resistance training 2 days per week if approved by your doctor -recommend balance exercises 3+days per week:   Stand somewhere where you have something sturdy to hold onto if you lose balance.    1) lift up on toes, start with 5x per day and work up to 20x   2) stand and lift on leg straight out to the side so that foot is a few inches of the floor, start with 5x each side and work up to 20x each side  3) stand on one foot, start with 5 seconds each side and work up  to 20 seconds on each side  -Silver sneakers https://tools.silversneakers.com  -Walk with a Doc: http://stephens-thompson.biz/  -try to include resistance (weight lifting/strength building) and balance exercises twice per week: or the following link for ideas: ChessContest.fr  UpdateClothing.com.cy  STRESS MANAGEMENT - so important for health and well being -try meditating, or just sitting quietly with deep breathing while intentionally relaxing all parts of your body for 5 minutes daily  SOCIAL CONNECTIONS: -options in [place] if you wish to engage in more social and exercise related activities:  -Silver sneakers https://tools.silversneakers.com  -Walk with a Doc: http://stephens-thompson.biz/  -Check out the [place] 50+ section on the [place] [employer] (hiking clubs, book clubs, cards and games, chess, exercise classes, aquatic classes and much more) - see the website for details: https://www.[place].gov/departments/parks-recreation/active-adults50  -YouTube has lots of exercise videos for different ages and abilities as well  -[place] (a variety of indoor and outdoor inperson activities for adults). 810-021-5175. [address]  -Virtual Online Classes (a variety of topics): see seniorplanet.org or call [phone]  -consider volunteering at a school, hospice center, church, senior center or elsewhere

## 2022-03-21 NOTE — Telephone Encounter (Signed)
FYI   Patient had a vv with Dr. Maudie Mercury today. Anselmo Pickler was working with Dr. Maudie Mercury, who recommended pt follow up with PCP in 3-5 days. I called to get patient scheduled with PCP on 12/01, their next available day. LVM to have pt callback to schedule.

## 2022-03-21 NOTE — Progress Notes (Signed)
PATIENT CHECK-IN and HEALTH RISK ASSESSMENT QUESTIONNAIRE:  -completed by phone/video for upcoming Medicare Preventive Visit  Pre-Visit Check-in: 1)Vitals (height, wt, BP, etc) - record in vitals section for visit on day of visit 2)Review and Update Medications, Allergies PMH, Surgeries, Social history in Epic 3)Hospitalizations in the last year with date/reason? 09/06/2021 for Hypoxia/CAP - had rehab after  4)Review and Update Care Team (patient's specialists) in Epic 5) Complete PHQ9 in Epic  6) Complete Fall Screening in Epic 7)Review all Health Maintenance Due and order under PCP if not done.  8)Medicare Wellness Questionnaire: Answer theses question about your habits: Do you drink alcohol? Yes If yes, how many drinks do you have a day? Glass of white wine at bedtime. Sometimes a glass and a half.Helps her sleep. Reports sleep is great. Have you ever smoked? No Quit date if applicable?  N/A How many packs a day do/did you smoke? N/A Do you use smokeless tobacco? N/A Do you use an illicit drugs? N/A Do you exercises? Yes IF so, what type and how many days/minutes per week? Pt has a puppy and takes for walks. 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 Does a exercise routine as well with 5 lb weights, mat, and trampoline. Has had PT in the past. But has been doing less but plans to build exercise back up.  Are you sexually active?  No Number of partners? 0 Typical breakfast: Oatmeal or toast and egg, yogurt and fruit. Typical lunch: No lunch Typical dinner: Balanced meals, meat, vegetables and salad Typical snacks: Lime sherbet  Beverages: Ice tea, lime water, wine  Answer theses question about you: Can you perform most household chores?Yes, but once every other month has a cleaning service come in. Do you find it hard to follow a conversation in a noisy room? No Do you often ask people to speak up or repeat themselves? No Do you feel that you have a problem with memory? No Do you  balance your checkbook and or bank acounts? Yes Do you feel safe at home? Yes Last dentist visit? 01/2022 Do you need assistance with any of the following: Please note if so   Driving? No  Feeding yourself? No  Getting from bed to chair? Very stiff when she wakes up.  Getting to the toilet? No  Bathing or showering? No   Dressing yourself? No  Managing money? No  Climbing a flight of stairs? Yes, stiff and uses hand rail.  Preparing meals? No  Do you have Advanced Directives in place (Living Will, Healthcare Power or Attorney)? Yes   Last eye Exam and location? 2021, pt has it in her book somewhere.   Do you currently use prescribed or non-prescribed narcotic or opioid pain medications? No  Do you have a history or close family history of breast, ovarian, tubal or peritoneal cancer or a family member with BRCA (breast cancer susceptibility 1 and 2) gene mutations? Pt has history of Breast CA.  Nurse/Assistant Credentials/time stamp: 11/28/202 at 11: I acted as a Education administrator for Dr. Colin Benton Anselmo Pickler, LPN    ----------------------------------------------------------------------------------------------------------------------------------------------------------------------------------------------------------------------   [place]: (Welcome to [facility], initial annual wellness or annual wellness exam)  Virtual Visit via Video Note  I connected with Carolyn Mckinney  on 03/21/22 by a video enabled telemedicine application and verified that I am speaking with the correct person using two identifiers. She tried to get video working but I was not able to see her. Changed to audio visit.  Location patient: home Location provider:work or home office Persons participating in the virtual visit: patient, provider  Concerns and/or follow up today: Loves to exercise, has done less due to knee and back OA over the last year, but is planning to get back to  it. Has had PT. Has some stiffness with her arthritis - sees ortho.  Reports some swelling of one leg recently, from knee down, she thought was related to arthritis. Some soreness. She also doesn't feel like her Lyrica has been helpful and does not like the way it makes her feel.   See HM section in Epic for other details of completed HM.  ROS: negative for report of fevers, unintentional weight loss, vision changes, vision loss, hearing loss or change, chest pain, sob, hemoptysis, melena, hematochezia, hematuria, genital discharge or lesions, falls (see below + for falls remotely in airport), bleeding or bruising, loc, thoughts of suicide or self harm, memory loss  Patient-completed extensive health risk assessment - reviewed and discussed with the patient: See Health Risk Assessment completed with patient prior to the visit either above or in recent phone note. This was reviewed in detailed with the patient today and appropriate recommendations, orders and referrals were placed as needed per Summary below and patient instructions.   Review of Medical History: -PMH, [place], Family History and current specialty and care providers reviewed and updated and listed below   Patient Care Team: Shelda Pal, DO as PCP - General (Family Medicine) Alda Berthold, DO as Consulting Physician (Neurology)   Past Medical History:  Diagnosis Date   Allergy    Cancer [facility])    Breast   Cataract    cataract repair bilaterally   Closed patellar sleeve fracture of right knee 05/2019   Hypertension    Meniere's disease    Personal history of radiation therapy 2001   Left Breast Cancer    Past Surgical History:  Procedure Laterality Date   BREAST BIOPSY Left 2001   BREAST LUMPECTOMY Left 01/25/2000   COLONOSCOPY  2006   VAGINAL HYSTERECTOMY  1991   Done for bleeding. Benign pathology    Social History   Socioeconomic History   Marital status: Widowed    Spouse name: Not on file    Number of children: Not on file   Years of education: Not on file   Highest education level: Not on file  Occupational History   Not on file  Tobacco Use   Smoking status: Never   Smokeless tobacco: Never  Vaping Use   Vaping Use: Never used  Substance and Sexual Activity   Alcohol use: Yes    Comment: just wine/glass of wine in the evening   Drug use: Never   Sexual activity: Not Currently  Other Topics Concern   Not on file  Social History Narrative   Left Handed   One Story Home   Drinks Caffeine    Social Determinants of Health   Financial Resource Strain: Low Risk  (03/10/2021)   Overall Financial Resource Strain (CARDIA)    Difficulty of Paying Living Expenses: Not hard at all  Food Insecurity: No Food Insecurity (10/15/2019)   Hunger Vital Sign    Worried About Running Out of Food in the Last Year: Never true    [place] in the Last Year: Never true  Transportation Needs: No Transportation Needs (03/10/2021)   PRAPARE - Hydrologist (Medical): No    Lack of Transportation (Non-Medical):  No  Physical Activity: Sufficiently Active (03/10/2021)   Exercise Vital Sign    Days of Exercise per Week: 3 days    Minutes of Exercise per Session: 60 min  Stress: No Stress Concern Present (03/10/2021)   [place]    Feeling of Stress : Not at all  Social Connections: Moderately Integrated (03/10/2021)   Social Connection and Isolation Panel [NHANES]    Frequency of Communication with Friends and Family: More than three times a week    Frequency of Social Gatherings with Friends and Family: More than three times a week    Attends Religious Services: More than 4 times per year    Active Member of [employer] or Organizations: Yes    Attends Archivist Meetings: More than 4 times per year    Marital Status: Widowed  Intimate Partner Violence: Not At Risk (03/10/2021)    Humiliation, Afraid, Rape, and Kick questionnaire    Fear of Current or Ex-Partner: No    Emotionally Abused: No    Physically Abused: No    Sexually Abused: No    Family History  Problem Relation Age of Onset   Arthritis Mother    Heart disease Father    Colon cancer Neg Hx    Colon polyps Neg Hx    Esophageal cancer Neg Hx    Rectal cancer Neg Hx    Stomach cancer Neg Hx     Current Outpatient Medications on File Prior to Visit  Medication Sig Dispense Refill   amLODipine (NORVASC) 5 MG tablet Take 1 tablet by mouth once daily 90 tablet 0   Calcium Carb-Cholecalciferol (CALCIUM + D3 PO) Take 1 tablet by mouth daily.     DULoxetine (CYMBALTA) 60 MG capsule Take 1 capsule (60 mg total) by mouth at bedtime. 90 capsule 3   Ferrous Sulfate (IRON PO) Take 1 tablet by mouth daily.     fluocinonide gel (LIDEX) 8.46 % Apply 1 Application topically 2 (two) times daily.     losartan (COZAAR) 100 MG tablet Take 1 tablet by mouth once daily 90 tablet 0   meloxicam (MOBIC) 7.5 MG tablet Take 1 tablet (7.5 mg total) by mouth daily as needed for pain. 30 tablet 3   Multiple Vitamin (MULTIVITAMIN ADULT PO) Take 1 tablet by mouth daily.     pregabalin (LYRICA) 150 MG capsule Take 1 capsule by mouth twice daily (Patient taking differently: Take 150 mg by mouth daily.) 180 capsule 1   PSEUDOEPHEDRINE-DM PO Take 1 tablet by mouth daily.     traZODone (DESYREL) 50 MG tablet TAKE 1 TO 2 TABLETS BY MOUTH AT BEDTIME AS NEEDED FOR SLEEP (Patient taking differently: Take 50-100 mg by mouth at bedtime.) 180 tablet 2   tretinoin (RETIN-A) 0.05 % cream Apply topically at bedtime.     valACYclovir (VALTREX) 1000 MG tablet Take 1,000 mg by mouth daily.     VITAMIN K PO Take 1 tablet by mouth daily.     zinc gluconate 50 MG tablet Take 50 mg by mouth daily.     No current facility-administered medications on file prior to visit.    No Known Allergies     Physical Exam There were no vitals filed for this  visit. Estimated body mass index is 27.59 kg/m as calculated from the following:   Height as of this encounter: _0  (1.549 m).   Weight as of this encounter: 146 lb (66.2 kg).  EKG (  optional): deferred due to virtual visit  GENERAL: alert, oriented, no audible sounds of distress, vision exam deferred due to pandemic and/or virtual encounter  PSYCH/NEURO: pleasant and cooperative, no obvious depression or anxiety, speech and thought processing grossly intact, Cognitive function grossly intact        03/21/2022   11:33 AM 03/10/2021    1:16 PM 09/22/2020   11:23 AM 10/15/2019    2:43 PM  Depression screen PHQ 2/9  Decreased Interest 0 0 0 0  Down, Depressed, Hopeless 0 0 0 0  PHQ - 2 Score 0 0 0 0       09/09/2021    9:45 PM 09/10/2021   10:00 AM 02/24/2022    1:26 PM 03/20/2022    2:29 PM 03/21/2022   11:33 AM  Fall Risk  Falls in the past year?   _0 Was there an injury with Fall?   _1 Fall Risk Category Calculator   _2 Fall Risk Category   Moderate High High  Patient Fall Risk Level High fall risk High fall risk High fall risk    Patient at Risk for Falls Due to     Other (Comment);History of fall(s)  Patient at Risk for Falls Due to - Comments     Pt fell in an airport twice, tripped on the rugs  Fall risk Follow up   Falls evaluation completed  Falls evaluation completed     SUMMARY AND PLAN:  Medicare annual wellness visit, subsequent  Discussed applicable health maintenance/preventive health measures and advised and referred or ordered per patient preferences:  Health Maintenance  Topic Date Due   Medicare Annual Wellness (AWV)  03/10/2022   COVID-19 Vaccine (7 - 2023-24 season) 05/02/2022   Pneumonia Vaccine 35+ Years old  Completed   INFLUENZA VACCINE  Completed   DEXA SCAN  Last done 10/21/19, reviewed with pt and advised repeat dexa, vit D, calcium, wt bearing exercise, balance exercises 3+ days per week. She plans to contact PCP to order per  her preference.   Hepatitis C Screening  Completed   Zoster Vaccines- Shingrix  Completed   HPV VACCINES  Aged Out   COLONOSCOPY (Pts 45-53yr Insurance coverage will need to be confirmed)  Discontinued     Education and counseling on the following was provided based on the above review of health and a plan/checklist for the patient, along with additional information discussed, was provided for the patient in the patient instructions :  -Discussed potential etiologies of current acute concerns, evaluation and advised inperson visit with PCP office promptly for the leg swelling and medication concerns. Advised assistant to schedule. Pt agreeable.   -Provided counseling and plan for increased risk of falling if applicable per above screening. Declined Referral for PT as has done in the past. I reviewed balance exercises and how to perform safely and let her know of recs to do 3+ days per week. -Advised and counseled on maintaining healthy weight and healthy lifestyle - including the importance of a health diet, regular physical activity, social connections and stress management. -Advised and counseled on a whole foods based healthy diet and regular exercise: discussed a heart healthy whole foods based diet at length. A summary of a healthy diet was provided in the Patient Instructions. - Recommended regular exercise and discussed options within the community. Supported, counseled on current state and goals.   -Advised yearly dental visits at minimum and regular eye exams -Advised  and counseled on alcohol use. Advised cutting back to no more than 1 drink per day/7 per week.   Follow up: see patient instructions     Patient Instructions  I really enjoyed getting to talk with you today! I am available on Tuesdays and Thursdays for virtual visits if you have any questions or concerns, or if I can be of any further assistance.   CHECKLIST FROM ANNUAL WELLNESS VISIT:  -Follow up:  -Inperson  visit with your Primary Doctor office in next few days to address leg issues and medication concerns -yearly for annual wellness visit with primary care office  Here is a list of your preventive care/health maintenance measures and the plan for each:  Health Maintenance  Topic Date Due   COVID-19 Vaccine (7 - 2023-24 season) 05/02/2022   Medicare Annual Wellness (AWV)  03/22/2023   Pneumonia Vaccine 72+ Years old  Completed   INFLUENZA VACCINE  Completed   DEXA SCAN  You had mild bone weakness/loss on your last report and you are due for another. Please discuss with Dr. Nani Ravens as discussed. Calcium, vitamin D, weight bearing exercise, a healthy diet and some medications can help.   Hepatitis C Screening  Completed   Zoster Vaccines- Shingrix  Completed   HPV VACCINES  Aged Out   COLONOSCOPY (Pts 45-55yr Insurance coverage will need to be confirmed)  Discontinued    -See a dentist at least yearly  -Get your eyes checked per your eye specialist's recommendations  -Other issues addressed today: -healthy whole food plant heavy diet (see below for details) -exercise guidelines for adults (see below) -balance exercises to reduce falls/injury - see below -alcohol recommendations for women: no more than 1 drink per day, no more than 7 drinks per week.    -I have included below further information regarding a healthy whole foods based diet, physical activity guidlelines, stress management and opportunities for social connections. I hope you find this information useful.   -----------------------------------------------------------------------------------------------------------------------------------------------------------------------------------------------------------------------------------------------------------  FOOD - THE FUEL FOR A HAPPY HEALTHY LIFE: -eat real food: lots of colorful vegetables (half the plate) -consume on a regular basis: whole grains (make sure first  ingredient on label contains the word "whole"), fresh fruits, fish, nuts, seeds, healthy oils (such as olive oil, avocado oil, grape seed oil) -may eat small amounts of dairy and lean meat on occasion, but avoid processed meats such as ham, bacon, lunch meat, etc. -drink water -try to avoid fast food and pre-packaged foods, processed meat -try to avoid foods that contain any ingredients with names you do not recognize  -try to avoid sugar/sweets (except for the natural sugar that occurs in fresh fruit) -try to avoid sweet drinks -try to avoid white rice, white bread, pasta (unless whole grain), white or yellow potatoes  MOVE - the key to keeping your body moving and working best: -gradually increase intentional physical activity -move and stretch your body, legs, feet and arms when sitting for long periods -try to get at least 150 minutes per week os sweaty exercise once approved by your doctor, 20-30 minutes of sustained activity or two 10 minute episodes of sustained activity every day.  -recommend strength training or resistance training 2 days per week if approved by your doctor -recommend balance exercises 3+days per week:   Stand somewhere where you have something sturdy to hold onto if you lose balance.    1) lift up on toes, start with 5x per day and work up to 20x   2) stand  and lift on leg straight out to the side so that foot is a few inches of the floor, start with 5x each side and work up to 20x each side   3) stand on one foot, start with 5 seconds each side and work up to 20 seconds on each side  -Silver sneakers https://tools.silversneakers.com  -Walk with a Doc: http://stephens-thompson.biz/  -try to include resistance (weight lifting/strength building) and balance exercises twice per week: or the following link for ideas: ChessContest.fr  UpdateClothing.com.cy  STRESS MANAGEMENT -  so important for health and well being -try meditating, or just sitting quietly with deep breathing while intentionally relaxing all parts of your body for 5 minutes daily  SOCIAL CONNECTIONS: -options in [place] if you wish to engage in more social and exercise related activities:  -Silver sneakers https://tools.silversneakers.com  -Walk with a Doc: http://stephens-thompson.biz/  -Check out the [place] 50+ section on the [place] [employer] [place] (hiking clubs, book clubs, cards and games, chess, exercise classes, aquatic classes and much more) - see the website for details: https://www.[place].gov/departments/parks-recreation/active-adults50  -YouTube has lots of exercise videos for different ages and abilities as well  -[place] (a variety of indoor and outdoor inperson activities for adults). [phone]. [address]  -Virtual Online Classes (a variety of topics): see seniorplanet.org or call 2677282241  -consider volunteering at a school, hospice center, church, senior center or elsewhere         Lucretia Kern, DO

## 2022-03-23 ENCOUNTER — Encounter: Payer: Self-pay | Admitting: Family Medicine

## 2022-03-23 NOTE — Telephone Encounter (Signed)
LVM for patient to call back and schedule appt

## 2022-03-24 ENCOUNTER — Encounter: Payer: Self-pay | Admitting: Family Medicine

## 2022-03-24 ENCOUNTER — Ambulatory Visit (INDEPENDENT_AMBULATORY_CARE_PROVIDER_SITE_OTHER): Payer: Medicare Other | Admitting: Family Medicine

## 2022-03-24 VITALS — BP 121/80 | HR 73 | Temp 97.7°F | Ht 61.0 in | Wt 152.0 lb

## 2022-03-24 DIAGNOSIS — W19XXXA Unspecified fall, initial encounter: Secondary | ICD-10-CM | POA: Diagnosis not present

## 2022-03-24 DIAGNOSIS — R202 Paresthesia of skin: Secondary | ICD-10-CM

## 2022-03-24 MED ORDER — DULOXETINE HCL 60 MG PO CPEP: 60.0000 mg | ORAL_CAPSULE | Freq: Two times a day (BID) | ORAL | 2 refills | Status: DC

## 2022-03-24 MED ORDER — PREGABALIN 100 MG PO CAPS: 100.0000 mg | ORAL_CAPSULE | Freq: Two times a day (BID) | ORAL | 0 refills | Status: DC

## 2022-03-24 MED ORDER — PREGABALIN 75 MG PO CAPS: 75.0000 mg | ORAL_CAPSULE | Freq: Two times a day (BID) | ORAL | 0 refills | Status: DC

## 2022-03-24 NOTE — Patient Instructions (Signed)
Let me know if you want to set up with physical therapy to strengthen things to prevent falls.   Take the Cymbalta in the morning and evening now.   Let us know if you need anything.

## 2022-03-24 NOTE — Progress Notes (Signed)
Chief Complaint  Patient presents with   Leg Problem    Has fallen 3 times recently Decrease neuropathy medication Swelling in left leg since Sunday.       Subjective: Patient is a 76 y.o. female here for follow up.  Patient is swelling 3 times recently which she attributes to not lifting her left leg high enough.  Each time related to tripping on something.  She has not lost consciousness or hit her head.  She denies any palpitations or lightheaded sensation.  She has a history of paresthesias.  She was seen neurology who told her that they cannot do anything further.  She was placed on duloxetine 60 mg daily which she reports compliance with and no adverse effects.  Was helping a little bit with the paresthesias but her neurologist noted she cannot go higher.  Past Medical History:  Diagnosis Date   Allergy    Cancer [facility])    Breast   Cataract    cataract repair bilaterally   Closed patellar sleeve fracture of right knee 05/2019   Hypertension    Meniere's disease    Personal history of radiation therapy 2001   Left Breast Cancer    Objective: BP 121/80 (BP Location: Left Arm, Patient Position: Sitting, Cuff Size: Normal)   Pulse 73   Temp 97.7 F (36.5 C) (Oral)   Ht '5\' 1"'$  (1.549 m)   Wt 152 lb (68.9 kg)   SpO2 96%   BMI 28.72 kg/m  General: Awake, appears stated age Heart: RRR, 2+ pitting bilateral LE edema tapering at the proximal third of the tibia Lungs: CTAB, no rales, wheezes or rhonchi. No accessory muscle use Psych: Age appropriate judgment and insight, normal affect and mood  Assessment and Plan: Paresthesia of both feet - Plan: DULoxetine (CYMBALTA) 60 MG capsule  Fall, initial encounter  Chronic, unstable. Increase Cymbalta from 60 mg/d to 60 mg bid. Will wean off of Lyrica 100 mg bid for 2 weeks, then 75 mg bid for 2 weeks, then 50 mg bid for 2 weeks and then 25 mg bid for 2 weeks. Can stop afterwards. F/u in 1 mo.  Offered referral to PT. Pt  prefers to try HEP first as she has been to PT in past. She will message if she changes her mind.  The patient voiced understanding and agreement to the plan.  [place], DO 03/24/22  2:52 PM

## 2022-03-27 ENCOUNTER — Ambulatory Visit: Payer: Medicare Other | Admitting: Family Medicine

## 2022-03-28 ENCOUNTER — Ambulatory Visit (HOSPITAL_BASED_OUTPATIENT_CLINIC_OR_DEPARTMENT_OTHER)
Admission: RE | Admit: 2022-03-28 | Discharge: 2022-03-28 | Disposition: A | Discharge status: Home / Self Care | Payer: Medicare Other | Source: Ambulatory Visit | Attending: Family Medicine | Admitting: Family Medicine

## 2022-03-28 ENCOUNTER — Encounter: Payer: Self-pay | Admitting: Family Medicine

## 2022-03-28 ENCOUNTER — Ambulatory Visit (INDEPENDENT_AMBULATORY_CARE_PROVIDER_SITE_OTHER): Payer: Medicare Other | Admitting: Family Medicine

## 2022-03-28 ENCOUNTER — Ambulatory Visit: Payer: Self-pay

## 2022-03-28 VITALS — BP 120/72 | Ht 61.0 in | Wt 146.0 lb

## 2022-03-28 DIAGNOSIS — M79662 Pain in left lower leg: Secondary | ICD-10-CM | POA: Diagnosis not present

## 2022-03-28 DIAGNOSIS — M25572 Pain in left ankle and joints of left foot: Secondary | ICD-10-CM | POA: Diagnosis not present

## 2022-03-28 DIAGNOSIS — M7989 Other specified soft tissue disorders: Secondary | ICD-10-CM | POA: Diagnosis not present

## 2022-03-28 DIAGNOSIS — M60862 Other myositis, left lower leg: Secondary | ICD-10-CM | POA: Diagnosis not present

## 2022-03-28 DIAGNOSIS — R600 Localized edema: Secondary | ICD-10-CM | POA: Diagnosis not present

## 2022-03-28 DIAGNOSIS — M609 Myositis, unspecified: Secondary | ICD-10-CM | POA: Insufficient documentation

## 2022-03-28 DIAGNOSIS — T79A29A Traumatic compartment syndrome of unspecified lower extremity, initial encounter: Secondary | ICD-10-CM | POA: Insufficient documentation

## 2022-03-28 MED ORDER — HYDROCODONE-ACETAMINOPHEN 5-325 MG PO TABS: 1.0000 | ORAL_TABLET | Freq: Three times a day (TID) | ORAL | 0 refills | Status: DC | PRN

## 2022-03-28 NOTE — Assessment & Plan Note (Signed)
Acutely occurring.  Having significant pain on exam with unilateral leg swelling.  Ongoing for a few weeks with exceptional pain.  Nonspecific hyperemia within the deep posterior compartment of the lower leg.  Possible for myositis given these ultrasound findings and less likely for infection.  May have component of compartment syndrome given the exquisite pain. -Counseled on home exercise therapy and supportive care. -Norco. -CBC, CMP, ANA, sed rate, CRP, CK, aldolase, LDH -X-ray of the tib and fib and ankle -Lower venous duplex of the left lower extremity. -May need to consider compartment testing

## 2022-03-28 NOTE — Progress Notes (Signed)
  Carolyn Mckinney - 76 y.o. female MRN [id number]  Date of birth: 1945/11/14  SUBJECTIVE:  Including CC & ROS.  No chief complaint on file.   Carolyn Mckinney is a 76 y.o. female that is presenting with a severe left leg pain.  The pain has been ongoing for at least 3 weeks.  Having tenderness on exam as well as redness and swelling.  The left leg is comparatively is more swollen than the right.  No fevers or chills.  Has had at least 1 fall about a week ago.    Review of Systems See HPI   HISTORY: Past Medical, Surgical, Social, and Family History Reviewed & Updated per EMR.   Pertinent Historical Findings include:  Past Medical History:  Diagnosis Date   Allergy    Cancer ([place])    Breast   Cataract    cataract repair bilaterally   Closed patellar sleeve fracture of right knee 05/2019   Hypertension    Meniere's disease    Personal history of radiation therapy 2001   Left Breast Cancer    Past Surgical History:  Procedure Laterality Date   BREAST BIOPSY Left 2001   BREAST LUMPECTOMY Left 01/25/2000   COLONOSCOPY  2006   VAGINAL HYSTERECTOMY  1991   Done for bleeding. Benign pathology     PHYSICAL EXAM:  VS: BP 120/72   Ht '5\' 1"'$  (1.549 m)   Wt 146 lb (66.2 kg)   BMI 27.59 kg/m  Physical Exam Gen: NAD, alert, cooperative with exam, well-appearing MSK:  Neurovascularly intact    Limited ultrasound: Left leg pain:  No effusion the suprapatellar pouch. Soft tissue cobblestoning appreciated in the distal third of the lower leg as well as in the foot. Increased hyperemia in the deep posterior compartment within the lower distal leg No cortical irregularity of the distal fibula or tibial appreciated An effusion was appreciated within the knee with no hyperemia.  Summary: Nonspecific soft tissue swelling and hyperemia within the lower leg that is concerning for myositis.  Ultrasound and interpretation by Clearance Coots, MD           ASSESSMENT & PLAN:    Myositis Acutely occurring.  Having significant pain on exam with unilateral leg swelling.  Ongoing for a few weeks with exceptional pain.  Nonspecific hyperemia within the deep posterior compartment of the lower leg.  Possible for myositis given these ultrasound findings and less likely for infection.  May have component of compartment syndrome given the exquisite pain. -Counseled on home exercise therapy and supportive care. -Norco. -CBC, CMP, ANA, sed rate, CRP, CK, aldolase, LDH -X-ray of the tib and fib and ankle -Lower venous duplex of the left lower extremity. -May need to consider compartment testing

## 2022-03-28 NOTE — Patient Instructions (Signed)
Good to see you Please try to elevate your leg  I will call with the results.  Please let me know if the pain gets worse  Please use the pain medicine as needed  Please send me a message in MyChart with any questions or updates.  Please see me back in 1 week.   --Dr. Raeford Razor

## 2022-03-29 ENCOUNTER — Telehealth: Payer: Self-pay | Admitting: Family Medicine

## 2022-03-29 NOTE — Telephone Encounter (Signed)
Left VM for patient. If she calls back please have her speak with a nurse/CMA and inform that her labs are normal. Her ultrasound was showing signs of a ruptured baker's cyst that is likely the reason for her swelling. No signs of infection at this time. Please me up to date on the pain.   If any questions then please take the best time and phone number to call and I will try to call her  back.   Rosemarie Ax, MD [facility] 03/29/2022, 4:43 PM

## 2022-03-30 LAB — COMPREHENSIVE METABOLIC PANEL
ALT: 20 IU/L (ref 0–32)
AST: 28 IU/L (ref 0–40)
Albumin/Globulin Ratio: 2 (ref 1.2–2.2)
Albumin: 4.9 g/dL — ABNORMAL HIGH (ref 3.8–4.8)
Alkaline Phosphatase: 80 IU/L (ref 44–121)
BUN/Creatinine Ratio: 14 (ref 12–28)
BUN: 11 mg/dL (ref 8–27)
Bilirubin Total: 0.6 mg/dL (ref 0.0–1.2)
CO2: 23 mmol/L (ref 20–29)
Calcium: 9.6 mg/dL (ref 8.7–10.3)
Chloride: 100 mmol/L (ref 96–106)
Creatinine, Ser: 0.76 mg/dL (ref 0.57–1.00)
Globulin, Total: 2.4 g/dL (ref 1.5–4.5)
Glucose: 83 mg/dL (ref 70–99)
Potassium: 3.9 mmol/L (ref 3.5–5.2)
Sodium: 140 mmol/L (ref 134–144)
Total Protein: 7.3 g/dL (ref 6.0–8.5)
eGFR: 81 mL/min/{1.73_m2} (ref >59)

## 2022-03-30 LAB — CBC WITH DIFFERENTIAL
Basophils Absolute: 0.1 10*3/uL (ref 0.0–0.2)
Basos: 1 %
EOS (ABSOLUTE): 0.1 10*3/uL (ref 0.0–0.4)
Eos: 1 %
Hematocrit: 43.2 % (ref 34.0–46.6)
Hemoglobin: 14.5 g/dL (ref 11.1–15.9)
Immature Grans (Abs): 0 10*3/uL (ref 0.0–0.1)
Immature Granulocytes: 0 %
Lymphocytes Absolute: 2 10*3/uL (ref 0.7–3.1)
Lymphs: 29 %
MCH: 31.8 pg (ref 26.6–33.0)
MCHC: 33.6 g/dL (ref 31.5–35.7)
MCV: 95 fL (ref 79–97)
Monocytes Absolute: 0.7 10*3/uL (ref 0.1–0.9)
Monocytes: 10 %
Neutrophils Absolute: 4 10*3/uL (ref 1.4–7.0)
Neutrophils: 59 %
RBC: 4.56 x10E6/uL (ref 3.77–5.28)
RDW: 12.3 % (ref 11.7–15.4)
WBC: 6.8 10*3/uL (ref 3.4–10.8)

## 2022-03-30 LAB — SEDIMENTATION RATE: Sed Rate: 11 mm/hr (ref 0–40)

## 2022-03-30 LAB — ANA,IFA RA DIAG PNL W/RFLX TIT/PATN
ANA Titer 1: NEGATIVE
Cyclic Citrullin Peptide Ab: 10 units (ref 0–19)
Rheumatoid fact SerPl-aCnc: <10 IU/mL (ref <14.0)

## 2022-03-30 LAB — C-REACTIVE PROTEIN: CRP: 2 mg/L (ref 0–10)

## 2022-03-30 LAB — LACTATE DEHYDROGENASE: LDH: 163 IU/L (ref 119–226)

## 2022-03-30 LAB — ALDOLASE: Aldolase: 4.9 U/L (ref 3.3–10.3)

## 2022-03-30 LAB — CK: Total CK: 146 U/L (ref 32–182)

## 2022-04-04 ENCOUNTER — Encounter: Payer: Self-pay | Admitting: Family Medicine

## 2022-04-04 ENCOUNTER — Ambulatory Visit (INDEPENDENT_AMBULATORY_CARE_PROVIDER_SITE_OTHER): Payer: Medicare Other | Admitting: Family Medicine

## 2022-04-04 VITALS — BP 120/72 | Ht 61.0 in | Wt 145.0 lb

## 2022-04-04 DIAGNOSIS — M60862 Other myositis, left lower leg: Secondary | ICD-10-CM

## 2022-04-04 DIAGNOSIS — M5416 Radiculopathy, lumbar region: Secondary | ICD-10-CM

## 2022-04-04 DIAGNOSIS — T79A29A Traumatic compartment syndrome of unspecified lower extremity, initial encounter: Secondary | ICD-10-CM | POA: Diagnosis not present

## 2022-04-04 MED ORDER — HYDROCODONE-ACETAMINOPHEN 5-325 MG PO TABS: 1.0000 | ORAL_TABLET | Freq: Three times a day (TID) | ORAL | 0 refills | Status: DC | PRN

## 2022-04-04 NOTE — Assessment & Plan Note (Signed)
Acutely occurring.  Imaging was demonstrating a collapsing Baker's cyst.  Concern for compartment syndrome given the severity of her pain.  Pulses and function are intact distal to this.  X-ray was reassuring. -Counseled on home exercise therapy and supportive care. -Referral to Dr. Marlou Sa for the consideration of compartment testing.

## 2022-04-04 NOTE — Progress Notes (Signed)
  Carolyn Mckinney - 76 y.o. female MRN [id number]  Date of birth: 03/11/1946  SUBJECTIVE:  Including CC & ROS.  No chief complaint on file.   Carolyn Mckinney is a 76 y.o. female that is presenting with acute worsening of her left lower leg pain as well as pain from the lower back down the left leg.  Ultrasound of the leg demonstrated no blood clot but did show a collapsing Baker's cyst.  She does have pain that originates in the lower lumbar spine and radiates down the left lower leg.  MRI did show nerve impingement from May 2023.Marland Kitchen   Review of Systems See HPI   HISTORY: Past Medical, Surgical, Social, and Family History Reviewed & Updated per EMR.   Pertinent Historical Findings include:  Past Medical History:  Diagnosis Date   Allergy    Cancer ([place])    Breast   Cataract    cataract repair bilaterally   Closed patellar sleeve fracture of right knee 05/2019   Hypertension    Meniere's disease    Personal history of radiation therapy 2001   Left Breast Cancer    Past Surgical History:  Procedure Laterality Date   BREAST BIOPSY Left 2001   BREAST LUMPECTOMY Left 01/25/2000   COLONOSCOPY  2006   VAGINAL HYSTERECTOMY  1991   Done for bleeding. Benign pathology     PHYSICAL EXAM:  VS: BP 120/72   Ht '5\' 1"'$  (1.549 m)   Wt 145 lb (65.8 kg)   BMI 27.40 kg/m  Physical Exam Gen: NAD, alert, cooperative with exam, well-appearing MSK:  Neurovascularly intact       ASSESSMENT & PLAN:   Lumbar radiculopathy Acute on chronic in nature.  Having significant left-sided pain today. -Counseled on home exercise therapy and supportive care. -Pursue epidural.  Compartment syndrome of lower leg (HCC) Acutely occurring.  Imaging was demonstrating a collapsing Baker's cyst.  Concern for compartment syndrome given the severity of her pain.  Pulses and function are intact distal to this.  X-ray was reassuring. -Counseled on home exercise therapy and supportive care. -Referral to Dr.  Marlou Sa for the consideration of compartment testing.

## 2022-04-04 NOTE — Assessment & Plan Note (Signed)
Acute on chronic in nature.  Having significant left-sided pain today. -Counseled on home exercise therapy and supportive care. -Pursue epidural.

## 2022-04-04 NOTE — Patient Instructions (Signed)
Good to see you  We are sending you for compartment testing with Dr. Marlou Sa  We are sending you to [place] for Epidural  Please send me a message in MyChart with any questions or updates.  Please see me back 2-3 weeks.   --Dr. Raeford Razor

## 2022-04-07 ENCOUNTER — Ambulatory Visit (INDEPENDENT_AMBULATORY_CARE_PROVIDER_SITE_OTHER): Payer: Medicare Other | Admitting: Orthopedic Surgery

## 2022-04-07 ENCOUNTER — Encounter: Payer: Self-pay | Admitting: Orthopedic Surgery

## 2022-04-07 DIAGNOSIS — M79662 Pain in left lower leg: Secondary | ICD-10-CM

## 2022-04-07 NOTE — Progress Notes (Signed)
Office Visit Note   Patient: Carolyn Mckinney           Date of Birth: 07-18-1945           MRN: [id number] Visit Date: 04/07/2022 Requested by: Myra Rude, MD [address] [place],  [place] [address] PCP: Sharlene Dory, DO  Subjective: Chief Complaint  Patient presents with   Left Leg - Pain    HPI: Carolyn Mckinney is a 76 y.o. female who presents to the office reporting left leg pain.  She has noticed increased left leg pain over the last 2 to 3 weeks that has felt about the same today as it has 2 weeks ago.  Localizes pain to the calf region with some "fullness" sensation.  She describes constant pain.  He also has history of left knee osteoarthritis that has bothered for at least 10 years.  Mostly localizes pain in the medial aspect of the knee and posterior knee pain.  She also has history of low back pain and radicular pain that extends down the left leg to her toes.  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  No history of prior knee, hip, back surgery.  She is taking hydrocodone at bedtime to help with sleeping over the last several weeks.  She recently saw Dr. Jordan Likes 3 days ago who recommended ESI.  Also referred patient to this office for evaluation of compartment syndrome..  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              ROS: All systems reviewed are negative as they relate to the chief complaint within the history of present illness.  Patient denies fevers or chills.  Assessment & Plan: Visit Diagnoses:  1. Pain of left calf     Plan: Patient is a 76 year old female who presents for evaluation of left leg pain.  Referred by Dr. Jordan Likes for evaluation of compartment syndrome.  She has history of knee  arthritis and radicular pain from the left lower extremity.  Lumbar spine ESI scheduled by Dr. Jordan Likes pending for Monday.  impression is no current evidence of left lower extremity compartment syndrome.  Recommended she proceed with ESI to see if this will help her left leg pain that she is experiencing more recently as well as using compression stocking in the left leg to help with swelling she has.  She does have a little bit of pitting edema along the distal tibia.  Follow-up in 4 weeks for clinical recheck.  Plan to have patient return to see Dr. Estrellita Ludwig in 6 weeks.  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  Follow-Up Instructions: No follow-ups on file.   Orders:  No orders of the defined types were placed in this encounter.  No orders of the defined types were placed in this encounter.     Procedures: No procedures performed   Clinical Data: No additional findings.  Objective: Vital Signs: There were no vitals taken for this visit.  Physical Exam:  Constitutional: Patient appears  well-developed HEENT:  Head: Normocephalic Eyes:EOM are normal Neck: Normal range of motion Cardiovascular: Normal rate Pulmonary/chest: Effort normal Neurologic: Patient is alert Skin: Skin is warm Psychiatric: Patient has normal mood and affect  Ortho Exam: Ortho exam demonstrates left knee with flexion contracture of about 5 degrees.  115 degrees knee flexion.  Positive effusion present.  No significant tenderness throughout the calf region.  She has no pain with passive or active ankle dorsiflexion/plantarflexion.  Palpable DP pulse rated 2+.  She has compartments that are soft and easily compressible without pain in the calf.  Intact ankle dorsiflexion, plantarflexion,  inversion, eversion.  There is no cellulitis or any significant skin changes noted.  No evidence of clonus.  Able to perform straight leg raise.  No pain with passive plantarflexion or dorsiflexion of the ankle or toes.  Specialty Comments:  No specialty comments available.  Imaging: No results found.   PMFS History: Patient Active Problem List   Diagnosis Date Noted   Compartment syndrome of lower leg (HCC) 03/28/2022   Paresthesia of both feet 03/24/2022   CAP (community acquired pneumonia) 09/07/2021   Acute respiratory failure with hypoxia (HCC) 09/07/2021   Sacroiliac joint dysfunction of left side 07/25/2021   Trigger ring finger of left hand 06/27/2021   Chronic right SI joint pain 06/27/2021   Facet hypertrophy of lumbar region 06/17/2020   Lumbar radiculopathy 06/10/2020   Primary osteoarthritis of left knee 05/12/2020   Strain of left hamstring 05/12/2020   Stress fracture of left foot 01/26/2020   Weakness of both legs 07/01/2019   Closed fracture of right tibial plateau 06/11/2019   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   Acne 06/03/2018   Insomnia 06/03/2018   Neuropathic pain 05/02/2018   Essential hypertension 05/02/2018   Meniere disease, 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   Past Medical History:  Diagnosis Date   Allergy    Cancer (HCC)    Breast   Cataract    cataract repair bilaterally   Closed patellar sleeve fracture of right knee 05/2019   Hypertension    Meniere's disease    Personal history of radiation therapy 2001   Left Breast Cancer    Family History  Problem Relation Age of Onset   Arthritis Mother    Heart disease Father    Colon cancer Neg Hx    Colon polyps Neg Hx    Esophageal cancer Neg Hx    Rectal cancer Neg Hx    Stomach cancer Neg Hx     Past Surgical History:  Procedure Laterality Date   BREAST BIOPSY Left 2001   BREAST LUMPECTOMY Left 01/25/2000   COLONOSCOPY  2006   VAGINAL HYSTERECTOMY  1991   Done for bleeding. Benign pathology   Social  History   Occupational History   Not on file  Tobacco Use   Smoking status: Never   Smokeless tobacco: Never  Vaping Use   Vaping Use: Never used  Substance and Sexual Activity   Alcohol use: Yes    Comment: just wine/glass of wine in the evening   Drug use: Never   Sexual activity: Not Currently

## 2022-04-09 ENCOUNTER — Encounter: Payer: Self-pay | Admitting: Orthopedic Surgery

## 2022-04-10 ENCOUNTER — Ambulatory Visit
Admission: RE | Admit: 2022-04-10 | Discharge: 2022-04-10 | Disposition: A | Discharge status: Home / Self Care | Payer: Medicare Other | Source: Ambulatory Visit | Attending: Family Medicine | Admitting: Family Medicine

## 2022-04-10 DIAGNOSIS — M5416 Radiculopathy, lumbar region: Secondary | ICD-10-CM

## 2022-04-10 DIAGNOSIS — M47817 Spondylosis without myelopathy or radiculopathy, lumbosacral region: Secondary | ICD-10-CM | POA: Diagnosis not present

## 2022-04-10 MED ORDER — METHYLPREDNISOLONE ACETATE 40 MG/ML INJ SUSP (RADIOLOG
80.0000 mg | Freq: Once | INTRAMUSCULAR | Status: AC
  Administered 2022-04-10: 80 mg via EPIDURAL

## 2022-04-10 MED ORDER — IOPAMIDOL (ISOVUE-M 200) INJECTION 41%
1.0000 mL | Freq: Once | INTRAMUSCULAR | Status: AC
  Administered 2022-04-10: 1 mL via EPIDURAL

## 2022-04-10 NOTE — Discharge Instructions (Signed)

## 2022-04-26 ENCOUNTER — Ambulatory Visit: Payer: Self-pay

## 2022-04-26 ENCOUNTER — Ambulatory Visit (INDEPENDENT_AMBULATORY_CARE_PROVIDER_SITE_OTHER): Payer: Medicare Other | Admitting: Family Medicine

## 2022-04-26 VITALS — BP 108/64 | Ht 61.0 in | Wt 145.0 lb

## 2022-04-26 DIAGNOSIS — M5416 Radiculopathy, lumbar region: Secondary | ICD-10-CM | POA: Diagnosis not present

## 2022-04-26 DIAGNOSIS — M47816 Spondylosis without myelopathy or radiculopathy, lumbar region: Secondary | ICD-10-CM | POA: Diagnosis not present

## 2022-04-26 DIAGNOSIS — M1712 Unilateral primary osteoarthritis, left knee: Secondary | ICD-10-CM | POA: Diagnosis not present

## 2022-04-26 MED ORDER — TRIAMCINOLONE ACETONIDE 40 MG/ML IJ SUSP
40.0000 mg | Freq: Once | INTRAMUSCULAR | Status: AC
  Administered 2022-04-26: 40 mg via INTRA_ARTICULAR

## 2022-04-26 NOTE — Addendum Note (Signed)
Addended by: Cresenciano Lick on: 04/26/2022 01:24 PM   Modules accepted: Orders

## 2022-04-26 NOTE — Patient Instructions (Signed)
Good to see you Please use ice as needed for the knee and heat on the back  Please try the exercises  We can order the facet injections in 2 weeks to avoid too much steroids  Please send me a message in MyChart with any questions or updates.  Please see me back in 6-8 weeks.   --Dr. Raeford Razor

## 2022-04-26 NOTE — Assessment & Plan Note (Signed)
Currently having more right-sided lower back pain that is different than the left-sided radicular pain of the left leg. -Counseled on home exercise therapy and supportive care. -Could pursue facet injections at right-sided L4-L5 and L5-S1

## 2022-04-26 NOTE — Assessment & Plan Note (Signed)
Has improved with recent steroid injection.  Her left leg pain is more consistent with an origin of the lumbar spine.  There was concern for compartment syndrome and found to have a Baker's cyst in the left knee. -Counseled on home exercise therapy and supportive care. -Could repeat injection if needed.

## 2022-04-26 NOTE — Progress Notes (Signed)
  Carolyn Mckinney - 77 y.o. female MRN [id number]  Date of birth: 1945-12-29  SUBJECTIVE:  Including CC & ROS.  No chief complaint on file.   Carolyn Mckinney is a 77 y.o. female that is following up for her left-sided radicular pain, right-sided low back pain and left knee pain.  Her radicular pain has improved with her recent epidural steroid injection.  Continues to have medial sided left knee pain.  Was found to have a Baker's cyst that was partially ruptured recently.  She continues to have an uncomfortable feeling on the right lower back.    Review of Systems See HPI   HISTORY: Past Medical, Surgical, Social, and Family History Reviewed & Updated per EMR.   Pertinent Historical Findings include:  Past Medical History:  Diagnosis Date   Allergy    Cancer ([place])    Breast   Cataract    cataract repair bilaterally   Closed patellar sleeve fracture of right knee 05/2019   Hypertension    Meniere's disease    Personal history of radiation therapy 2001   Left Breast Cancer    Past Surgical History:  Procedure Laterality Date   BREAST BIOPSY Left 2001   BREAST LUMPECTOMY Left 01/25/2000   COLONOSCOPY  2006   VAGINAL HYSTERECTOMY  1991   Done for bleeding. Benign pathology     PHYSICAL EXAM:  VS: BP 108/64   Ht '5\' 1"'$  (1.549 m)   Wt 145 lb (65.8 kg)   BMI 27.40 kg/m  Physical Exam Gen: NAD, alert, cooperative with exam, well-appearing MSK:  Neurovascularly intact     Aspiration/Injection Procedure Note Carolyn Mckinney 08/29/1945  Procedure: Injection Indications: left knee pain  Procedure Details Consent: Risks of procedure as well as the alternatives and risks of each were explained to the (patient/caregiver).  Consent for procedure obtained. Time Out: Verified patient identification, verified procedure, site/side was marked, verified correct patient position, special equipment/implants available, medications/allergies/relevent history reviewed, required imaging  and test results available.  Performed.  The area was cleaned with iodine and alcohol swabs.    The left knee superior lateral suprapatellar pouch was injected using 1 cc of 1% lidocaine on a 22-gauge 1-1/2 inch needle.  The syringe was switched to mixture containing 1 cc's of 40 mg Kenalog and 4 cc's of 0.25% bupivacaine was injected.  Ultrasound was used. Images were obtained in long views showing the injection.     A sterile dressing was applied.  Patient did tolerate procedure well.     ASSESSMENT & PLAN:   Lumbar radiculopathy Has improved with recent steroid injection.  Her left leg pain is more consistent with an origin of the lumbar spine.  There was concern for compartment syndrome and found to have a Baker's cyst in the left knee. -Counseled on home exercise therapy and supportive care. -Could repeat injection if needed.  Facet hypertrophy of lumbar region Currently having more right-sided lower back pain that is different than the left-sided radicular pain of the left leg. -Counseled on home exercise therapy and supportive care. -Could pursue facet injections at right-sided L4-L5 and L5-S1  Primary osteoarthritis of left knee Acute on chronic in nature.  She would like to avoid surgery at all cost. -Counseled on home exercise therapy and supportive care. -Injection today. -Could consider nerve ablation or gel injection

## 2022-04-26 NOTE — Assessment & Plan Note (Signed)
Acute on chronic in nature.  She would like to avoid surgery at all cost. -Counseled on home exercise therapy and supportive care. -Injection today. -Could consider nerve ablation or gel injection

## 2022-04-28 ENCOUNTER — Ambulatory Visit: Payer: Medicare Other | Admitting: Orthopedic Surgery

## 2022-05-01 ENCOUNTER — Other Ambulatory Visit: Payer: Self-pay | Admitting: Family Medicine

## 2022-05-01 DIAGNOSIS — G4700 Insomnia, unspecified: Secondary | ICD-10-CM

## 2022-05-02 ENCOUNTER — Ambulatory Visit (INDEPENDENT_AMBULATORY_CARE_PROVIDER_SITE_OTHER): Payer: Medicare Other | Admitting: Family Medicine

## 2022-05-02 ENCOUNTER — Encounter: Payer: Self-pay | Admitting: Family Medicine

## 2022-05-02 VITALS — BP 120/78 | HR 84 | Temp 98.0°F | Ht 61.0 in | Wt 148.4 lb

## 2022-05-02 DIAGNOSIS — R202 Paresthesia of skin: Secondary | ICD-10-CM | POA: Diagnosis not present

## 2022-05-02 MED ORDER — PREGABALIN 50 MG PO CAPS: 50.0000 mg | ORAL_CAPSULE | Freq: Every evening | ORAL | 0 refills | Status: DC

## 2022-05-02 MED ORDER — PREGABALIN 25 MG PO CAPS: 25.0000 mg | ORAL_CAPSULE | Freq: Every evening | ORAL | 0 refills | Status: DC

## 2022-05-02 MED ORDER — PREGABALIN 75 MG PO CAPS: 75.0000 mg | ORAL_CAPSULE | Freq: Every evening | ORAL | 0 refills | Status: DC

## 2022-05-02 MED ORDER — DULOXETINE HCL 60 MG PO CPEP: 60.0000 mg | ORAL_CAPSULE | Freq: Every day | ORAL | 2 refills | Status: DC

## 2022-05-02 MED ORDER — OXCARBAZEPINE 150 MG PO TABS: 150.0000 mg | ORAL_TABLET | Freq: Two times a day (BID) | ORAL | 5 refills | Status: DC

## 2022-05-02 NOTE — Patient Instructions (Signed)
Let's go back on the once daily Cymbalta.   We are weaning off the pregabalin.   Let us know if you need anything.

## 2022-05-02 NOTE — Progress Notes (Signed)
Chief Complaint  Patient presents with   Follow-up    1 month    Subjective: Patient is a 77 y.o. female here for f/u.  Trying to wean down from Lyrica due to mental fogginess. Taking 100 mg qhs and feels better mentally. Wondering how to wean down further. Increased Cymbalta from 60 mg qd to bid. Noticed no improvement in paresthesias. Has failed Lyrica, gabapentin, Effexor.   Past Medical History:  Diagnosis Date   Allergy    Cancer [facility])    Breast   Cataract    cataract repair bilaterally   Closed patellar sleeve fracture of right knee 05/2019   Hypertension    Meniere's disease    Personal history of radiation therapy 2001   Left Breast Cancer    Objective: BP 120/78 (BP Location: Left Arm, Patient Position: Sitting, Cuff Size: Normal)   Pulse 84   Temp 98 F (36.7 C) (Oral)   Ht '5\' 1"'$  (1.549 m)   Wt 148 lb 6 oz (67.3 kg)   SpO2 98%   BMI 28.04 kg/m  General: Awake, appears stated age Lungs: No accessory muscle use Psych: Age appropriate judgment and insight, normal affect and mood  Assessment and Plan: Paresthesia of both feet - Plan: OXcarbazepine (TRILEPTAL) 150 MG tablet, DULoxetine (CYMBALTA) 60 MG capsule  Chronic, uncontrolled. Go back to qd dosing of Cymbalta 60 mg/d. Add Trileptal 150 mg bid. If this doesn't help, would consider watchful waiting vs referral to pain clinic.  She will take 75 mg qhs of Lyrica for 14 d, then 50 mg qhs for 14 d, then 25 mg qhs for 14 d and then stop.  Fu in 6 mo for med ck or prn.  The patient voiced understanding and agreement to the plan.  [place], DO 05/02/22  3:55 PM

## 2022-05-03 ENCOUNTER — Ambulatory Visit: Payer: Self-pay

## 2022-05-03 ENCOUNTER — Ambulatory Visit (INDEPENDENT_AMBULATORY_CARE_PROVIDER_SITE_OTHER): Payer: Medicare Other | Admitting: Orthopedic Surgery

## 2022-05-03 VITALS — Ht 61.0 in | Wt 148.5 lb

## 2022-05-03 DIAGNOSIS — M5416 Radiculopathy, lumbar region: Secondary | ICD-10-CM

## 2022-05-03 NOTE — Progress Notes (Signed)
Orthopedic Spine Surgery Office Note  Assessment: Patient is a 77 y.o. female with low back pain that radiates into bilateral lower extremities along the posterior aspect of the thighs.  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 with 1.9 mm of T2 signal within facets.   Plan: -Explained that initially conservative treatment is tried as a significant number of patients may experience relief with these treatment modalities. Discussed that the conservative treatments include:  -activity modification  -physical therapy  -over the counter pain medications  -medrol dosepak  -lumbar steroid injections -Since patient has gotten significant relief with steroid injection, recommended that she continue with conservative treatment and can get a repeat injection if needed in a few months -I also told her to work on core strengthening and use over-the-counter medications as needed for additional pain relief -I told her that if her symptoms are no longer well-controlled with conservative treatments that is when surgery may have a role for symptomatic relief -Patient should return to office on an as-needed basis   Patient expressed understanding of the plan and all questions were answered to the patient's satisfaction.   ___________________________________________________________________________   History:  Patient is a 77 y.o. female who presents today for lumbar spine.  Patient has several different pains.  She has pain in her left knee, right SI joint, and her low back.  She comes to my office today to talk about her low back pain that radiates into her bilateral lower extremities.  She says on the left lower extremity it feels like a heaviness in the entire leg with pain in the posterior and lateral thigh. On the right leg, she has pain that goes into the buttock and the posterior aspect of the thigh.  This pain is been present for several months.  There is no trauma or  injury that brought on the pain.  Her primary care doctor recommended a ESI that she got within the last 3 weeks which provided her with significant relief of her low back and radiating leg pains.  She still has left knee pain and right SI joint pain.  She is now able to do her daily activities with a tolerable amount of pain.  Has paresthesias and numbness in her bilateral feet and a history of neuropathy.  No other numbness or paresthesias.   Weakness: Denies Symptoms of imbalance: Yes, from vertigo.  No other imbalance or new imbalance Paresthesias and numbness: Yes, into bilateral feet.  No other numbness or paresthesias Bowel or bladder incontinence: Denies Saddle anesthesia: Denies  Treatments tried: Physical therapy, NSAIDs, Tylenol, activity modification, ESI  Review of systems: Denies fevers and chills, night sweats, unexplained weight loss, history of cancer, pain that wakes her at night  Past medical history: Neuropathy Left knee osteoarthritis Meniere's disease HTN Breast cancer  Allergies: NKDA  Past surgical history:  Bilateral cataract surgery Lumpectomy Hysterectomy  Social history: Denies use of nicotine product (smoking, vaping, patches, smokeless) Alcohol use: 1 glass of wine per night Denies recreational drug use   Physical Exam:  General: no acute distress, appears stated age Neurologic: alert, answering questions appropriately, following commands Respiratory: unlabored breathing on room air, symmetric chest rise Psychiatric: appropriate affect, normal cadence to speech   MSK (spine):  -Strength exam      Left  Right EHL    4/5  4/5 TA    5/5  5/5 GSC    5/5  5/5 Knee extension  5/5  5/5  Hip flexion   5/5  5/5  -Sensory exam    Sensation intact to light touch in L3-S1 nerve distributions of bilateral lower extremities (though decreased in bilateral feet)  -Achilles DTR: 1/4 on the left, 1/4 on the right -Patellar tendon DTR: 2/4 on the  left, 2/4 on the right  -Straight leg raise: negative -Contralateral straight leg raise: negative -Femoral nerve stretch test: negative -Clonus: no beats bilaterally  -Left hip exam: No pain through range of motion, negative Stinchfield, negative Faber  -Right hip exam: no pain through range of motion, negative Stinchfield, positive Faber  Imaging: XR of the lumbar spine from 12/06/2021 and 05/03/2022 was independently reviewed and interpreted, showing disc height loss at L4/5 and L5/S1.  Vacuum disc phenomenon at L5/S1.  Spondylolisthesis at L4/5 -shifts 1 mm between flexion and extension views.  No fracture seen.  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  Spondylolisthesis seen at L4/5.  Foraminal stenosis at L4/5 and L5/S1.  T2 signal within the L4-5 facet joints measures 1.9 mm.   Patient name: Carolyn Mckinney Patient MRN: [id number] Date of visit: 05/03/22

## 2022-05-05 ENCOUNTER — Ambulatory Visit: Payer: Medicare Other | Admitting: Orthopedic Surgery

## 2022-05-19 ENCOUNTER — Encounter: Payer: Self-pay | Admitting: Family Medicine

## 2022-05-20 ENCOUNTER — Other Ambulatory Visit: Payer: Self-pay | Admitting: Family Medicine

## 2022-05-22 ENCOUNTER — Other Ambulatory Visit: Payer: Self-pay | Admitting: Family Medicine

## 2022-05-24 DIAGNOSIS — H43811 Vitreous degeneration, right eye: Secondary | ICD-10-CM | POA: Diagnosis not present

## 2022-05-24 DIAGNOSIS — H18429 Band keratopathy, unspecified eye: Secondary | ICD-10-CM | POA: Diagnosis not present

## 2022-05-24 DIAGNOSIS — H16223 Keratoconjunctivitis sicca, not specified as Sjogren's, bilateral: Secondary | ICD-10-CM | POA: Diagnosis not present

## 2022-05-26 ENCOUNTER — Ambulatory Visit (INDEPENDENT_AMBULATORY_CARE_PROVIDER_SITE_OTHER): Payer: Medicare Other | Admitting: Obstetrics and Gynecology

## 2022-05-26 ENCOUNTER — Encounter: Payer: Self-pay | Admitting: Obstetrics and Gynecology

## 2022-05-26 VITALS — BP 129/81 | HR 79 | Ht 61.0 in | Wt 151.0 lb

## 2022-05-26 DIAGNOSIS — Z01419 Encounter for gynecological examination (general) (routine) without abnormal findings: Secondary | ICD-10-CM | POA: Diagnosis not present

## 2022-05-26 NOTE — Progress Notes (Signed)
   ANNUAL EXAM Patient name: Carolyn Mckinney MRN [id number]  Date of birth: 14-Apr-1946 Chief Complaint:   No chief complaint on file.  History of Present Illness:   Carolyn Mckinney is a 77 y.o. G3P3003 being seen today for a routine annual exam.  Current complaints: none  No LMP recorded. Patient has had a hysterectomy.  No issues. Not sexually active - husband passed 4 years ago. No vaginal bleeding or abnormal vaginal dischage. Overall doing well and wants to be sure all is ok.   Last pap n/a Last mammogram: 02/2022 birads 1 Last colonoscopy: 02/2020     03/21/2022   11:33 AM 03/10/2021    1:16 PM 09/22/2020   11:23 AM 10/15/2019    2:43 PM  Depression screen PHQ 2/9  Decreased Interest 0 0 0 0  Down, Depressed, Hopeless 0 0 0 0  PHQ - 2 Score 0 0 0 0         No data to display           Review of Systems:   Pertinent items are noted in HPI Denies any headaches, blurred vision, fatigue, shortness of breath, chest pain, abdominal pain, abnormal vaginal discharge/itching/odor/irritation, problems with periods, bowel movements, urination, or intercourse unless otherwise stated above. Pertinent History Reviewed:  Reviewed past medical,surgical, social and family history.  Reviewed problem list, medications and allergies. Physical Assessment:   Vitals:   05/26/22 1028  BP: 129/81  Pulse: 79  Weight: 151 lb (68.5 kg)  Height: '5\' 1"'$  (1.549 m)  Body mass index is 28.53 kg/m.        Physical Examination:   General appearance - well appearing, and in no distress  Mental status - alert, oriented to person, place, and time  Psych:  She has a normal mood and affect  Skin - warm and dry, normal color, no suspicious lesions noted  Chest - effort normal, all lung fields clear to auscultation bilaterally  Heart - normal rate and regular rhythm  Breasts - breasts appear normal, no suspicious masses, no skin or nipple changes or  axillary nodes  Abdomen - soft, nontender,  nondistended, no masses or organomegaly  Pelvic -  VULVA: normal appearing vulva with no masses, tenderness or lesions   VAGINA: atrophic introitus  CERVIX: absent  UTERUS: absent  ADNEXA: No adnexal masses or tenderness noted.  Extremities:  No swelling or varicosities noted  Chaperone present for exam  No results found for this or any previous visit (from the past 24 hour(s)).    Assessment & Plan:  1. Well woman exam with routine gynecological exam No issues, normal exam today   Mammogram: in 1 year, or sooner if problems Colonoscopy: per GI, or sooner if problems  No orders of the defined types were placed in this encounter.   Meds: No orders of the defined types were placed in this encounter.   Follow-up: No follow-ups on file.  Darliss Cheney, MD 05/26/2022 11:20 AM

## 2022-05-26 NOTE — Progress Notes (Signed)
Last mammogram 03-09-22

## 2022-06-02 ENCOUNTER — Other Ambulatory Visit: Payer: Self-pay | Admitting: Family Medicine

## 2022-06-02 NOTE — Telephone Encounter (Signed)
She has final rx for 25 mg Lyrica at pharmacy already. No need for refilling the 50's.

## 2022-06-02 NOTE — Telephone Encounter (Signed)
Patient informed and she was aware already.

## 2022-06-14 ENCOUNTER — Telehealth: Payer: Self-pay | Admitting: *Deleted

## 2022-06-14 DIAGNOSIS — M5416 Radiculopathy, lumbar region: Secondary | ICD-10-CM

## 2022-06-14 NOTE — Telephone Encounter (Signed)
-----   Message from Elberta Spaniel sent at 06/12/2022  9:16 AM EST ----- Regarding: Pt cld to request Epidural Per patient low back pain has returned & radiating down into leg--Pt says last Epidural worked really well and she request it again.  --- Pt's last Epidural 04/10/22.  Thx

## 2022-06-14 NOTE — Telephone Encounter (Signed)
Placed epidural for recurrent radicular pain.   Rosemarie Ax, MD [facility] 06/14/2022, 1:53 PM

## 2022-06-22 ENCOUNTER — Ambulatory Visit
Admission: RE | Admit: 2022-06-22 | Discharge: 2022-06-22 | Disposition: A | Discharge status: Home / Self Care | Payer: Medicare Other | Source: Ambulatory Visit | Attending: Family Medicine | Admitting: Family Medicine

## 2022-06-22 DIAGNOSIS — M5416 Radiculopathy, lumbar region: Secondary | ICD-10-CM

## 2022-06-22 MED ORDER — IOPAMIDOL (ISOVUE-M 200) INJECTION 41%
1.0000 mL | Freq: Once | INTRAMUSCULAR | Status: AC
  Administered 2022-06-22: 1 mL via EPIDURAL

## 2022-06-22 MED ORDER — METHYLPREDNISOLONE ACETATE 40 MG/ML INJ SUSP (RADIOLOG
80.0000 mg | Freq: Once | INTRAMUSCULAR | Status: AC
  Administered 2022-06-22: 80 mg via EPIDURAL

## 2022-06-22 NOTE — Discharge Instructions (Signed)

## 2022-06-27 ENCOUNTER — Other Ambulatory Visit (HOSPITAL_BASED_OUTPATIENT_CLINIC_OR_DEPARTMENT_OTHER): Payer: Self-pay

## 2022-06-28 ENCOUNTER — Other Ambulatory Visit: Payer: Self-pay | Admitting: Family Medicine

## 2022-07-10 ENCOUNTER — Telehealth: Payer: Self-pay

## 2022-07-10 NOTE — Telephone Encounter (Signed)
Patient scheduled for tomorrow

## 2022-07-10 NOTE — Telephone Encounter (Signed)
Patients trigger finger has been bothering her for about a month and she wanted to know if Dr. Raeford Razor would be able to give an injection in that finger (left ring finger). Patient wore the brace and did all the things and the pain has come back worse and would like to get an injection. Are we okay to schedule?

## 2022-07-11 ENCOUNTER — Ambulatory Visit (INDEPENDENT_AMBULATORY_CARE_PROVIDER_SITE_OTHER): Payer: Medicare Other | Admitting: Family Medicine

## 2022-07-11 ENCOUNTER — Other Ambulatory Visit: Payer: Self-pay

## 2022-07-11 ENCOUNTER — Encounter: Payer: Self-pay | Admitting: Family Medicine

## 2022-07-11 ENCOUNTER — Other Ambulatory Visit: Payer: Self-pay | Admitting: Family Medicine

## 2022-07-11 VITALS — BP 120/70 | Ht 61.0 in | Wt 151.0 lb

## 2022-07-11 DIAGNOSIS — M65342 Trigger finger, left ring finger: Secondary | ICD-10-CM | POA: Diagnosis not present

## 2022-07-11 DIAGNOSIS — G4700 Insomnia, unspecified: Secondary | ICD-10-CM

## 2022-07-11 MED ORDER — TRIAMCINOLONE ACETONIDE 40 MG/ML IJ SUSP
40.0000 mg | Freq: Once | INTRAMUSCULAR | Status: AC
  Administered 2022-07-11: 40 mg via INTRA_ARTICULAR

## 2022-07-11 NOTE — Patient Instructions (Signed)
Good to see you Please use the splint at night as needed  Please send me a message in MyChart with any questions or updates.  Please see me back as needed.   --Dr. Raeford Razor

## 2022-07-11 NOTE — Progress Notes (Signed)
  Carolyn Mckinney - 77 y.o. female MRN [id number]  Date of birth: [birth year]  SUBJECTIVE:  Including CC & ROS.  No chief complaint on file.   Carolyn Mckinney is a 76 y.o. female that is presenting with acute left ring finger trigger finger.  Ongoing for months.  No injury or inciting event.  Has trouble with significant triggering and no improvement with splinting.    Review of Systems See HPI   HISTORY: Past Medical, Surgical, Social, and Family History Reviewed & Updated per EMR.   Pertinent Historical Findings include:  Past Medical History:  Diagnosis Date   Allergy    Cancer ([address])    Breast   Cataract    cataract repair bilaterally   Closed patellar sleeve fracture of right knee 05/2019   Hypertension    Meniere's disease    Personal history of radiation therapy 2001   Left Breast Cancer    Past Surgical History:  Procedure Laterality Date   BREAST BIOPSY Left 2001   BREAST LUMPECTOMY Left 01/25/2000   COLONOSCOPY  2006   VAGINAL HYSTERECTOMY  1991   Done for bleeding. Benign pathology     PHYSICAL EXAM:  VS: BP 120/70 (BP Location: Right Arm, Patient Position: Sitting)   Ht 5\' 1"  (1.549 m)   Wt 151 lb (68.5 kg)   BMI 28.53 kg/m  Physical Exam Gen: NAD, alert, cooperative with exam, well-appearing MSK:  Neurovascularly intact    Limited ultrasound: Left ring finger pain:  Nodule appreciated at the A1 pulley.  Summary: Findings consistent with trigger finger.  Ultrasound and interpretation by Clearance Coots, MD  Aspiration/Injection Procedure Note Carolyn Mckinney 1945/09/18  Procedure: Injection Indications: left ring trigger finger  Procedure Details Consent: Risks of procedure as well as the alternatives and risks of each were explained to the (patient/caregiver).  Consent for procedure obtained. Time Out: Verified patient identification, verified procedure, site/side was marked, verified correct patient position, special equipment/implants  available, medications/allergies/relevent history reviewed, required imaging and test results available.  Performed.  The area was cleaned with iodine and alcohol swabs.    The left ring finger volar tendon sheath 1 cc's of 40 mg Kenalog and 1 cc's of 0.25% bupivacaine was injected on a 25-gauge 1-1/2 inch needle.  Ultrasound was used. Images were obtained in long views showing the injection.     A sterile dressing was applied.  Patient did tolerate procedure well.    ASSESSMENT & PLAN:   Trigger ring finger of left hand Acute on chronic in nature.  Triggering is evident on exam and nodules appreciated. -Counseled on home exercise therapy and supportive care. -Injection today. -Counseled on splinting. -Could consider release.

## 2022-07-11 NOTE — Telephone Encounter (Signed)
Last OV--05/02/2022 Last RF--05/01/2022  #180 no refills

## 2022-07-11 NOTE — Assessment & Plan Note (Signed)
Acute on chronic in nature.  Triggering is evident on exam and nodules appreciated. -Counseled on home exercise therapy and supportive care. -Injection today. -Counseled on splinting. -Could consider release.

## 2022-08-07 ENCOUNTER — Encounter: Payer: Self-pay | Admitting: *Deleted

## 2022-09-06 ENCOUNTER — Other Ambulatory Visit: Payer: Self-pay | Admitting: Family Medicine

## 2022-09-06 DIAGNOSIS — M533 Sacrococcygeal disorders, not elsewhere classified: Secondary | ICD-10-CM | POA: Diagnosis not present

## 2022-09-06 DIAGNOSIS — M545 Low back pain, unspecified: Secondary | ICD-10-CM | POA: Diagnosis not present

## 2022-09-06 DIAGNOSIS — S0083XA Contusion of other part of head, initial encounter: Secondary | ICD-10-CM | POA: Diagnosis not present

## 2022-09-06 DIAGNOSIS — W1830XA Fall on same level, unspecified, initial encounter: Secondary | ICD-10-CM | POA: Diagnosis not present

## 2022-09-11 ENCOUNTER — Other Ambulatory Visit: Payer: Self-pay | Admitting: Family Medicine

## 2022-09-11 ENCOUNTER — Ambulatory Visit: Payer: Medicare Other | Admitting: Family Medicine

## 2022-09-11 DIAGNOSIS — M5416 Radiculopathy, lumbar region: Secondary | ICD-10-CM

## 2022-09-13 ENCOUNTER — Ambulatory Visit
Admission: RE | Admit: 2022-09-13 | Discharge: 2022-09-13 | Disposition: A | Discharge status: Home / Self Care | Payer: Medicare Other | Source: Ambulatory Visit | Attending: Family Medicine | Admitting: Family Medicine

## 2022-09-13 DIAGNOSIS — M545 Low back pain, unspecified: Secondary | ICD-10-CM | POA: Diagnosis not present

## 2022-09-13 DIAGNOSIS — M5416 Radiculopathy, lumbar region: Secondary | ICD-10-CM

## 2022-09-13 MED ORDER — IOPAMIDOL (ISOVUE-M 200) INJECTION 41%
1.0000 mL | Freq: Once | INTRAMUSCULAR | Status: AC
  Administered 2022-09-13: 1 mL via EPIDURAL

## 2022-09-13 MED ORDER — METHYLPREDNISOLONE ACETATE 40 MG/ML INJ SUSP (RADIOLOG
80.0000 mg | Freq: Once | INTRAMUSCULAR | Status: AC
  Administered 2022-09-13: 80 mg via EPIDURAL

## 2022-09-13 NOTE — Discharge Instructions (Signed)

## 2022-09-27 ENCOUNTER — Other Ambulatory Visit: Payer: Self-pay | Admitting: Family Medicine

## 2022-09-27 DIAGNOSIS — G4700 Insomnia, unspecified: Secondary | ICD-10-CM

## 2022-09-27 NOTE — Telephone Encounter (Signed)
Last OV--05/02/2022 Last RF--#180 no refills on 07/11/2022

## 2022-09-29 ENCOUNTER — Other Ambulatory Visit: Payer: Self-pay | Admitting: *Deleted

## 2022-09-29 MED ORDER — MELOXICAM 7.5 MG PO TABS: ORAL_TABLET | ORAL | 1 refills | Status: DC

## 2022-10-03 ENCOUNTER — Encounter: Payer: Self-pay | Admitting: Family Medicine

## 2022-10-06 ENCOUNTER — Encounter: Payer: Self-pay | Admitting: Family Medicine

## 2022-10-06 ENCOUNTER — Ambulatory Visit (INDEPENDENT_AMBULATORY_CARE_PROVIDER_SITE_OTHER): Payer: Medicare Other | Admitting: Family Medicine

## 2022-10-06 ENCOUNTER — Other Ambulatory Visit (HOSPITAL_BASED_OUTPATIENT_CLINIC_OR_DEPARTMENT_OTHER): Payer: Self-pay

## 2022-10-06 VITALS — BP 120/70 | HR 86 | Temp 98.2°F | Ht 60.0 in | Wt 146.5 lb

## 2022-10-06 DIAGNOSIS — L0292 Furuncle, unspecified: Secondary | ICD-10-CM | POA: Diagnosis not present

## 2022-10-06 MED ORDER — AREXVY 120 MCG/0.5ML IM SUSR
INTRAMUSCULAR | 0 refills | Status: DC
  Filled 2022-10-06: qty 0.5, 1d supply, fill #0

## 2022-10-06 MED ORDER — VALACYCLOVIR HCL 500 MG PO TABS: 1000.0000 mg | ORAL_TABLET | Freq: Every day | ORAL | 3 refills | Status: DC

## 2022-10-06 NOTE — Patient Instructions (Addendum)
Have a great time in [place]!  Send me a message if we are still having issues.  When you do wash it, use only soap and water. Do not vigorously scrub. Apply triple antibiotic ointment (like Neosporin) twice daily. Keep the area clean and dry.   Things to look out for: increasing pain not relieved by ibuprofen/acetaminophen, fevers, spreading redness, drainage of pus, or foul odor.  Let us know if you need anything.

## 2022-10-06 NOTE — Telephone Encounter (Signed)
error 

## 2022-10-06 NOTE — Progress Notes (Signed)
Chief Complaint  Patient presents with   Cyst    Discuss RSV and Covid she is going to [place] in July Change valtrex pharmacy to [facility]    Carolyn Mckinney is a 77 y.o. female here for a skin complaint.  Duration: 3 days Location: R inner thigh Pruritic? No Painful? Yes Drainage? Yes- blood, no pus Other associated symptoms: not changing in size, no fevers Therapies tried thus far: none  Past Medical History:  Diagnosis Date   Allergy    Cancer ([facility]CC)    Breast   Cataract    cataract repair bilaterally   Closed patellar sleeve fracture of right knee 05/2019   Hypertension    Meniere's disease    Personal history of radiation therapy 2001   Left Breast Cancer    BP 120/70 (BP Location: Left Arm, Patient Position: Sitting, Cuff Size: Normal)   Pulse 86   Temp 98.2 F (36.8 C) (Oral)   Ht 5' (1.524 m)   Wt 146 lb 8 oz (66.5 kg)   SpO2 93%   BMI 28.61 kg/m  Gen: awake, alert, appearing stated age Lungs: No accessory muscle use Skin: Examined in the presence of a female chaperone.  On the right medial proximal thigh, there is an excoriated and slightly raised lesion approximately 1.5 cm in diameter.  It is tender to palpation.  There is no drainage, fluctuance, spreading redness. Psych: Age appropriate judgment and insight  Furuncle  TAO for 7 d. Warning signs and symptoms verbalized and written down in AVS. She will let me know if anything changes. F/u prn. The patient voiced understanding and agreement to the plan.  Jilda Roche [place], DO 10/06/22 2:45 PM

## 2022-10-09 ENCOUNTER — Other Ambulatory Visit: Payer: Self-pay | Admitting: *Deleted

## 2022-10-09 DIAGNOSIS — M5416 Radiculopathy, lumbar region: Secondary | ICD-10-CM

## 2022-10-20 ENCOUNTER — Other Ambulatory Visit: Payer: Self-pay | Admitting: Family Medicine

## 2022-10-20 DIAGNOSIS — R202 Paresthesia of skin: Secondary | ICD-10-CM

## 2022-10-23 ENCOUNTER — Other Ambulatory Visit: Payer: Self-pay | Admitting: Family Medicine

## 2022-10-23 DIAGNOSIS — M5416 Radiculopathy, lumbar region: Secondary | ICD-10-CM

## 2022-11-02 ENCOUNTER — Other Ambulatory Visit: Payer: Self-pay | Admitting: Family Medicine

## 2022-11-02 DIAGNOSIS — R202 Paresthesia of skin: Secondary | ICD-10-CM

## 2022-11-03 ENCOUNTER — Ambulatory Visit
Admission: RE | Admit: 2022-11-03 | Discharge: 2022-11-03 | Disposition: A | Discharge status: Home / Self Care | Payer: Medicare Other | Source: Ambulatory Visit | Attending: Family Medicine | Admitting: Family Medicine

## 2022-11-03 DIAGNOSIS — M5416 Radiculopathy, lumbar region: Secondary | ICD-10-CM

## 2022-11-03 DIAGNOSIS — M4727 Other spondylosis with radiculopathy, lumbosacral region: Secondary | ICD-10-CM | POA: Diagnosis not present

## 2022-11-03 MED ORDER — IOPAMIDOL (ISOVUE-M 200) INJECTION 41%
1.0000 mL | Freq: Once | INTRAMUSCULAR | Status: AC
  Administered 2022-11-03: 1 mL via EPIDURAL

## 2022-11-03 MED ORDER — METHYLPREDNISOLONE ACETATE 40 MG/ML INJ SUSP (RADIOLOG
80.0000 mg | Freq: Once | INTRAMUSCULAR | Status: AC
  Administered 2022-11-03: 80 mg via EPIDURAL

## 2022-11-03 NOTE — Discharge Instructions (Signed)

## 2022-12-14 ENCOUNTER — Other Ambulatory Visit: Payer: Self-pay | Admitting: Family Medicine

## 2022-12-14 DIAGNOSIS — R202 Paresthesia of skin: Secondary | ICD-10-CM

## 2022-12-19 ENCOUNTER — Other Ambulatory Visit: Payer: Self-pay | Admitting: Family Medicine

## 2023-01-01 ENCOUNTER — Other Ambulatory Visit: Payer: Self-pay | Admitting: Family Medicine

## 2023-01-01 ENCOUNTER — Ambulatory Visit (INDEPENDENT_AMBULATORY_CARE_PROVIDER_SITE_OTHER): Payer: Medicare Other | Admitting: Family Medicine

## 2023-01-01 VITALS — BP 124/82 | Ht 60.0 in | Wt 145.0 lb

## 2023-01-01 DIAGNOSIS — M5416 Radiculopathy, lumbar region: Secondary | ICD-10-CM | POA: Diagnosis not present

## 2023-01-01 MED ORDER — MELOXICAM 7.5 MG PO TABS: ORAL_TABLET | ORAL | 1 refills | Status: DC

## 2023-01-01 NOTE — Patient Instructions (Signed)
You have lumbar radiculopathy. We will see if you can get another epidural injection for your back. If this isn't the case I'd recommend following back up with Dr. Christell Constant to discuss surgical options. Continue your cymbalta and trileptal.

## 2023-01-01 NOTE — Progress Notes (Deleted)
    SUBJECTIVE:   CHIEF COMPLAINT / HPI: knee, back pain  ***  PERTINENT  PMH / PSH: Left Knee OA, Right SI Joint pain, Lumbar radiculopathy  OBJECTIVE:   There were no vitals taken for this visit.  ***  ASSESSMENT/PLAN:   There are no diagnoses linked to this encounter. No follow-ups on file.  Celine Mans, MD [facility]

## 2023-01-02 ENCOUNTER — Encounter: Payer: Self-pay | Admitting: Family Medicine

## 2023-01-02 NOTE — Progress Notes (Signed)
PCP: Sharlene Dory, DO  Subjective:   HPI: Patient is a 77 y.o. female here for low back pain.  Patient has been seen previously by Dr. Jordan Likes for her back. She's also been to Dr. Christell Constant for discussion of this  She had an ESI in July which she reports helped but not for very long. Using a support brace which does help. Pain radiates down into both lower extremities though no numbness. No bowel/bladder dysfunction. Also with medial left knee pain - known arthritis and had steroid injections in the past.  Past Medical History:  Diagnosis Date   Allergy    Cancer ([facility]CC)    Breast   Cataract    cataract repair bilaterally   Closed patellar sleeve fracture of right knee 05/2019   Hypertension    Meniere's disease    Personal history of radiation therapy 2001   Left Breast Cancer    Current Outpatient Medications on File Prior to Visit  Medication Sig Dispense Refill   amLODipine (NORVASC) 5 MG tablet Take 1 tablet by mouth once daily 90 tablet 0   Calcium Carb-Cholecalciferol (CALCIUM + D3 PO) Take 1 tablet by mouth daily.     DULoxetine (CYMBALTA) 60 MG capsule Take 1 capsule (60 mg total) by mouth daily. 90 capsule 2   Ferrous Sulfate (IRON PO) Take 1 tablet by mouth daily.     losartan (COZAAR) 100 MG tablet Take 1 tablet by mouth once daily 90 tablet 0   Multiple Vitamin (MULTIVITAMIN ADULT PO) Take 1 tablet by mouth daily.     OXcarbazepine (TRILEPTAL) 150 MG tablet Take 1 tablet by mouth twice daily 60 tablet 0   PSEUDOEPHEDRINE-DM PO Take 1 tablet by mouth daily.     RSV vaccine recomb adjuvanted (AREXVY) 120 MCG/0.5ML injection Inject into the muscle. 0.5 mL 0   traZODone (DESYREL) 50 MG tablet TAKE 1 TO 2 TABLETS BY MOUTH AT BEDTIME AS NEEDED FOR SLEEP 180 tablet 3   valACYclovir (VALTREX) 500 MG tablet Take 2 tablets (1,000 mg total) by mouth daily. 180 tablet 3   VITAMIN K PO Take 1 tablet by mouth daily.     zinc gluconate 50 MG tablet Take 50 mg by mouth  daily.     No current facility-administered medications on file prior to visit.    Past Surgical History:  Procedure Laterality Date   BREAST BIOPSY Left 2001   BREAST LUMPECTOMY Left 01/25/2000   COLONOSCOPY  2006   VAGINAL HYSTERECTOMY  1991   Done for bleeding. Benign pathology    No Known Allergies  BP 124/82   Ht 5' (1.524 m)   Wt 145 lb (65.8 kg)   BMI 28.32 kg/m       No data to display              No data to display              Objective:  Physical Exam:  Gen: NAD, comfortable in exam room  Back: No gross deformity, scoliosis. No paraspinal tenderness.  No midline or bony TTP. FROM. Strength LEs 5/5 all muscle groups.   2+ MSRs in patellar and achilles tendons, equal bilaterally. Negative SLRs. Sensation intact to light touch bilaterally.   Assessment & Plan:  1. Low back pain - with radiation into both lower extremities to below the knees.  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  Previously done physical therapy without significant benefit. Last epidural  in July helped but not for very long.  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  Concerned this has progressed since that time.  Will continue these medications.  Order another epidural at L4-5 level.  If not improving would recommend she go back to see Dr. Christell Constant.

## 2023-01-03 NOTE — Discharge Instructions (Signed)

## 2023-01-04 ENCOUNTER — Ambulatory Visit
Admission: RE | Admit: 2023-01-04 | Discharge: 2023-01-04 | Disposition: A | Discharge status: Home / Self Care | Payer: Medicare Other | Source: Ambulatory Visit | Attending: Family Medicine | Admitting: Family Medicine

## 2023-01-04 DIAGNOSIS — M4727 Other spondylosis with radiculopathy, lumbosacral region: Secondary | ICD-10-CM | POA: Diagnosis not present

## 2023-01-04 DIAGNOSIS — M5416 Radiculopathy, lumbar region: Secondary | ICD-10-CM

## 2023-01-04 MED ORDER — IOPAMIDOL (ISOVUE-M 200) INJECTION 41%
1.0000 mL | Freq: Once | INTRAMUSCULAR | Status: AC
  Administered 2023-01-04: 1 mL via EPIDURAL

## 2023-01-04 MED ORDER — METHYLPREDNISOLONE ACETATE 40 MG/ML INJ SUSP (RADIOLOG
80.0000 mg | Freq: Once | INTRAMUSCULAR | Status: AC
  Administered 2023-01-04: 80 mg via EPIDURAL

## 2023-01-10 ENCOUNTER — Other Ambulatory Visit: Payer: Self-pay | Admitting: Family Medicine

## 2023-01-10 DIAGNOSIS — R202 Paresthesia of skin: Secondary | ICD-10-CM

## 2023-01-17 ENCOUNTER — Other Ambulatory Visit: Payer: Self-pay | Admitting: Family Medicine

## 2023-01-17 MED ORDER — LOSARTAN POTASSIUM 100 MG PO TABS: 100.0000 mg | ORAL_TABLET | Freq: Every day | ORAL | 3 refills | Status: DC

## 2023-01-22 ENCOUNTER — Other Ambulatory Visit (HOSPITAL_BASED_OUTPATIENT_CLINIC_OR_DEPARTMENT_OTHER): Payer: Self-pay | Admitting: Family Medicine

## 2023-01-22 DIAGNOSIS — Z1231 Encounter for screening mammogram for malignant neoplasm of breast: Secondary | ICD-10-CM

## 2023-01-23 ENCOUNTER — Encounter: Payer: Self-pay | Admitting: Sports Medicine

## 2023-01-23 ENCOUNTER — Other Ambulatory Visit (HOSPITAL_BASED_OUTPATIENT_CLINIC_OR_DEPARTMENT_OTHER): Payer: Self-pay

## 2023-01-23 ENCOUNTER — Ambulatory Visit (INDEPENDENT_AMBULATORY_CARE_PROVIDER_SITE_OTHER): Payer: Medicare Other | Admitting: Sports Medicine

## 2023-01-23 VITALS — BP 122/80 | Ht 60.0 in | Wt 145.0 lb

## 2023-01-23 DIAGNOSIS — M1712 Unilateral primary osteoarthritis, left knee: Secondary | ICD-10-CM

## 2023-01-23 DIAGNOSIS — Z23 Encounter for immunization: Secondary | ICD-10-CM | POA: Diagnosis not present

## 2023-01-23 MED ORDER — FLUAD 0.5 ML IM SUSY
PREFILLED_SYRINGE | Freq: Once | INTRAMUSCULAR | 0 refills | Status: AC
  Filled 2023-01-23: qty 0.5, 1d supply, fill #0

## 2023-01-23 MED ORDER — METHYLPREDNISOLONE ACETATE 40 MG/ML IJ SUSP
40.0000 mg | Freq: Once | INTRAMUSCULAR | Status: AC
Start: 2023-01-23 — End: 2023-01-23
  Administered 2023-01-23: 40 mg via INTRA_ARTICULAR

## 2023-01-23 NOTE — Addendum Note (Signed)
Addended by: Merrilyn Puma on: 01/23/2023 03:52 PM   Modules accepted: Orders

## 2023-01-23 NOTE — Progress Notes (Signed)
Subjective:    Patient ID: Carolyn Mckinney, female    DOB: 1945/05/23, 77 y.o.   MRN: [id number]  HPI chief complaint: Left hand pain and left knee pain  Patient is a very pleasant left-hand-dominant 77 year old female that presents with a couple of different complaints.  First complaint is pain in the left ring finger.  She has been treated in the past with a cortisone injection for trigger finger in this hand.  It was helpful but has not been curative.  Pain is primarily at the level of the MCP and the palmar aspect of the hand.  She has been wearing a splint but it has not been helping.  She also has a history of left knee DJD.  Pain is primarily along the medial knee.  She does endorse some mild swelling.  She has received cortisone injections that have been helpful here in the past.   Review of Systems As above    Objective:   Physical Exam  Well-developed, well-nourished.  No acute distress  Left hand: Active triggering of the left ring finger.  No soft tissue swelling.  Left knee: Range of motion 0 to 120 degrees.  1+ boggy synovitis.  She is tender to palpation along the medial joint line with pain but no popping with McMurray's.  Knee is grossly stable ligamentous exam.  Neurovascularly intact distally.      Assessment & Plan:   Left hand trigger finger Left knee DJD  Patient will refer to hand surgery for further treatment for her trigger finger.  For her left knee I did inject her today with cortisone.  This was done atraumatically under sterile technique utilizing anterior lateral approach.  She will follow-up as needed.  Consent obtained and verified. Time-out conducted. Noted no overlying erythema, induration, or other signs of local infection. Skin prepped in a sterile fashion. Topical analgesic spray: Ethyl chloride. Joint: Left knee Needle: 25 gauge 1.5 inch Completed without difficulty. Meds: 3 cc 1% Xylocaine, 1 cc (40 mg) Depo-Medrol  This note was  dictated using Dragon naturally speaking software and may contain errors in syntax, spelling, or content which have not been identified prior to signing this note.

## 2023-02-13 ENCOUNTER — Ambulatory Visit: Payer: Medicare Other | Admitting: Neurology

## 2023-02-14 ENCOUNTER — Ambulatory Visit: Payer: Medicare Other | Admitting: Family Medicine

## 2023-02-14 ENCOUNTER — Encounter: Payer: Self-pay | Admitting: Family Medicine

## 2023-02-14 VITALS — BP 120/80 | HR 78 | Temp 98.4°F | Ht 60.0 in | Wt 144.1 lb

## 2023-02-14 DIAGNOSIS — N3946 Mixed incontinence: Secondary | ICD-10-CM | POA: Diagnosis not present

## 2023-02-14 MED ORDER — MIRABEGRON ER 25 MG PO TB24
25.0000 mg | ORAL_TABLET | Freq: Every day | ORAL | 2 refills | Status: DC
Start: 2023-02-14 — End: 2023-03-19

## 2023-02-14 NOTE — Progress Notes (Signed)
Chief Complaint  Patient presents with   Urinary Incontinence    Subjective: Patient is a 77 y.o. female here for incontinence.  Steadily leaking more urine over the past 2 yrs. Sometimes happens when she is on the way to the toilet. Will sometimes lose urine when she coughs/sneezes. No sig change in freq. Denies fevers, abd pain, dysuria, bleeding, discharge, constipation. Drinks alcohol most nights, 6 oz of wine. Caffeine intake a couple ice teas and coffee in the AM. No changes over the past few years.   Past Medical History:  Diagnosis Date   Allergy    Cancer [facility])    Breast   Cataract    cataract repair bilaterally   Closed patellar sleeve fracture of right knee 05/2019   Hypertension    Meniere's disease    Personal history of radiation therapy 2001   Left Breast Cancer    Objective: BP 120/80 (BP Location: Left Arm, Patient Position: Sitting, Cuff Size: Normal)   Pulse 78   Temp 98.4 F (36.9 C) (Oral)   Ht 5' (1.524 m)   Wt 144 lb 2 oz (65.4 kg)   SpO2 99%   BMI 28.15 kg/m  General: Awake, appears stated age Mouth: MMM Abdomen: Bowel sounds present, soft, nontender, nondistended Heart: RRR, no LE edema Lungs: CTAB, no rales, wheezes or rhonchi. No accessory muscle use Psych: Age appropriate judgment and insight, normal affect and mood  Assessment and Plan: Mixed incontinence urge and stress - Plan: mirabegron ER (MYRBETRIQ) 25 MG TB24 tablet  Chronic, uncontrolled.  Start Myrbetriq 25 mg daily.  No fluid intake within 90 minutes of bedtime.  Try to limit caffeine intake after noon.  Send me a message if the medicine is too expensive.  Follow-up in 1 month to recheck.  Kegel exercises also provided. The patient voiced understanding and agreement to the plan.  Jilda Roche [place], DO 02/14/23  4:32 PM

## 2023-02-14 NOTE — Patient Instructions (Addendum)
Try to limit caffeine intake after noon.   No fluid intake within 90 minutes of bed when able.   Send me a message if the medicine is too expensive.   Let us know if you need anything.

## 2023-02-27 ENCOUNTER — Encounter: Payer: Self-pay | Admitting: Family Medicine

## 2023-02-27 ENCOUNTER — Other Ambulatory Visit: Payer: Self-pay | Admitting: Family Medicine

## 2023-03-01 ENCOUNTER — Encounter: Payer: Self-pay | Admitting: Pharmacist

## 2023-03-01 ENCOUNTER — Telehealth: Payer: Self-pay | Admitting: Family Medicine

## 2023-03-01 MED ORDER — DARIFENACIN HYDROBROMIDE ER 7.5 MG PO TB24: 7.5000 mg | ORAL_TABLET | Freq: Every day | ORAL | 2 refills | Status: DC

## 2023-03-01 NOTE — Progress Notes (Signed)
03/01/2023 Name: Carolyn Mckinney MRN: [id number] DOB: 1945-06-10  Chief Complaint  Patient presents with   Medication Management    Cost of Myrbetriq    Carolyn Mckinney is a 77 y.o. year old female who presented for a telephone visit.   They were referred to the pharmacist by their PCP for assistance in managing medication access.    Subjective: Medication Access/Adherence Received forward MyChart message from PCP that patient was having difficulty with cost of Myrbetriq.   Kathi Ludwig, [place]  to Me 02/28/23  5:12 PM Dr.Wendling wanted to see if you can assist with this Pt medication. Sharlene Dory, DO to Kathi Ludwig, [place] 02/28/23  8:18 AM Do we have a Myrbetriq rep to reach out to? Or refer to Evalee Gerard? 02/28/23  8:16 AM [place], CMA routed this conversation to Sharlene Dory, DO  February 27, 2023 Lura Em to [facility] (supporting Jilda Roche [place], [place])  02/27/23  6:28 PM Dr. Carmelia Roller, the Myrbetriq 25MG  is working very well, but as you said, it is very expensive ($13/day).  I have looked up other pharmacies in this area, and [facility] remains the least expensive.  My 30-day prescription ends Nov. 20.I am ready to investigate a generic form, or a less expensive drug.   Current Pharmacy:  [facility] [address] [place] - [address] [place] [address] Phone: [phone] Fax: [phone]  Patient reports affordability concerns with their medications: Yes  Patient reports access/transportation concerns to their pharmacy: No  Patient reports adherence concerns with their medications:  No     Spoke with patient - she reports Myrbetriq is working well but cost is high. She provided me with her Medicare Part D information so I could review her formulary for alternatives.  She has Medicare Part D plan with [employer] [phone])   Carolyn Mckinney has not tried other medications for  OAB in past.  She has enough Myrbetriq to last until 03/14/2023  Spoke with patient's pharmacy and last refill cost for #30 tablets of Myrbetriq 25mg  was $276.9   Objective:   Lab Results  Component Value Date   CREATININE 0.76 03/28/2022   BUN 11 03/28/2022   NA 140 03/28/2022   K 3.9 03/28/2022   CL 100 03/28/2022   CO2 23 03/28/2022    Medications Reviewed Today     Reviewed by Henrene Pastor, RPH-CPP (Pharmacist) on 03/01/23 at 1000  Med List Status: <None>   Medication Order Taking? Sig Documenting Provider Last Dose Status Informant  amLODipine (NORVASC) 5 MG tablet [id number]  Take 1 tablet by mouth once daily Sharlene Dory, DO  Active   Calcium Carb-Cholecalciferol (CALCIUM + D3 PO) [id number] No Take 1 tablet by mouth daily. [provider] Taking Active Self  DULoxetine (CYMBALTA) 60 MG capsule [id number] No Take 1 capsule (60 mg total) by mouth daily. Sharlene Dory, DO Taking Active   Ferrous Sulfate (IRON PO) [id number] No Take 1 tablet by mouth daily. [provider] Taking Active Self  losartan (COZAAR) 100 MG tablet [id number] No Take 1 tablet (100 mg total) by mouth daily. Sharlene Dory, DO Taking Active   meloxicam (MOBIC) 7.5 MG tablet [id number] No TAKE 1 TABLET BY MOUTH AS NEEDED FOR PAIN Hudnall, Azucena Fallen, MD Taking Active   mirabegron ER (MYRBETRIQ) 25 MG TB24 tablet [id number]  Take 1 tablet (25 mg total) by mouth daily. Sharlene Dory, DO  Active   Multiple Vitamin (MULTIVITAMIN ADULT PO) [id number] No Take 1 tablet by mouth daily. [provider] Taking Active Self  OXcarbazepine (TRILEPTAL) 150 MG tablet [id number] No Take 1 tablet by mouth twice daily Sharlene Dory, DO Taking Active   PSEUDOEPHEDRINE-DM PO [id number] No Take 1 tablet by mouth daily. [provider] Taking Active Self  RSV vaccine recomb adjuvanted (AREXVY) 120 MCG/0.5ML injection [id number] No Inject into the muscle.  Judyann Munson, MD Taking Active   traZODone (DESYREL) 50 MG tablet [id number] No TAKE 1 TO 2 TABLETS BY MOUTH AT BEDTIME AS NEEDED FOR SLEEP Wendling, Jilda Roche, DO Taking Active   valACYclovir (VALTREX) 500 MG tablet [id number] No Take 2 tablets (1,000 mg total) by mouth daily. Sharlene Dory, DO Taking Active   VITAMIN K PO [id number] No Take 1 tablet by mouth daily. [provider] Taking Active Self  zinc gluconate 50 MG tablet [id number] No Take 50 mg by mouth daily. [provider] Taking Active Self              Assessment/Plan:   Medication Access:  - Reviewed her 2024 [employer] Part D plan. She has to meet a $545 deductible for any Tier 3, 4 or 5 medications. She has not met this yet which is why Myrbetriq 25mg  was full cost when she filled in October. I estimate that she still has about $269 before she will meet this deductible so Myrbetriq will likely still be costly if she fills again in November. However in December cost should be lower.   - Formulary review:    Beta 3 Agonists Myrbetriq - tier 3 preferred brand; patient pays 15% of cost after she meets deductible. Cost after deductible estimated to be about $50 per month. Cost with GoodRx for brand or generic is still $300 / month Gemtesa 75mg  - tier 4, non preferred brand; patient pays 49% of cost after she meets deductible. Cost after deductible estimated to be around $280   Antimuscarinics:  Vesicare / solifenacin - tier 2; generic - patient pays $1.00 per 30 days. Solifenacin can increase QT interval so should monitor / use with caution with trazodone which also can prolong QT interval. Slightly more M3 than M1 selective - so might have fewer anticholinergic side effects Oxybutynin - tier 2; generic - patient pays $1.00; highest risk of dry mouth and effects on cognition.  Toviaz / fesoterodine ER 4mg  - tier 3 preferred brand; patient pays 15% of cost after deductible. Estimated cost before  deductible met = $100. After deductible met $15. Can get with GoodRx card for about $50 per month.  Enablex / darifenasin - not covered by her insurance but GoodRx cost is about $37 for 30 days. Has highest M3 affinity and low CNS penetration / accumulation. Theoretically should have fewer of the common anticholinergic adverse effect or effect on cognition.   Consider trial of darifenasin if patient prefers not to continue with Myrbetriq due to cost.   I did reach out to previous drug rep for Myrbetriq but I am pretty sure they no longer supply samples of Myrbetriq. I was unable to locate contact information for [facility] rep.    Forwarded above information to PCP to review.   Henrene Pastor, PharmD Clinical Pharmacist [facility]  Population Health [phone]

## 2023-03-02 NOTE — Telephone Encounter (Signed)
Enablex not covered by plan.   Preferred alternatives:  Gemtesa Tolterodine ER

## 2023-03-05 NOTE — Telephone Encounter (Signed)
Called pt was advised of GoodRx and pt stated she will use Good Rx.

## 2023-03-13 ENCOUNTER — Encounter (HOSPITAL_BASED_OUTPATIENT_CLINIC_OR_DEPARTMENT_OTHER): Payer: Self-pay

## 2023-03-13 ENCOUNTER — Ambulatory Visit (HOSPITAL_BASED_OUTPATIENT_CLINIC_OR_DEPARTMENT_OTHER)
Admission: RE | Admit: 2023-03-13 | Discharge: 2023-03-13 | Disposition: A | Discharge status: Home / Self Care | Payer: Medicare Other | Source: Ambulatory Visit | Attending: Family Medicine | Admitting: Family Medicine

## 2023-03-13 DIAGNOSIS — Z1231 Encounter for screening mammogram for malignant neoplasm of breast: Secondary | ICD-10-CM | POA: Insufficient documentation

## 2023-03-17 ENCOUNTER — Other Ambulatory Visit: Payer: Self-pay | Admitting: Family Medicine

## 2023-03-19 ENCOUNTER — Encounter: Payer: Self-pay | Admitting: Family Medicine

## 2023-03-19 ENCOUNTER — Ambulatory Visit (INDEPENDENT_AMBULATORY_CARE_PROVIDER_SITE_OTHER): Payer: Medicare Other | Admitting: Family Medicine

## 2023-03-19 VITALS — BP 126/74 | HR 75 | Temp 98.0°F | Resp 16 | Ht 61.0 in | Wt 145.4 lb

## 2023-03-19 DIAGNOSIS — N3946 Mixed incontinence: Secondary | ICD-10-CM

## 2023-03-19 NOTE — Progress Notes (Signed)
Chief Complaint  Patient presents with   Follow-up    Follow up    Subjective: Patient is a 77 y.o. female here for OAB f/u.  Started on Myrbetriq but it was too expensive. She was then started on Enablex. Reports compliance. Giving her dry mouth as an AE. Having jt stiffness on it. No other AE's. Reports marked improvement in urgency. Some nighttime frequency still but daytime symptoms are resolved.   Past Medical History:  Diagnosis Date   Allergy    Cancer [facility])    Breast   Cataract    cataract repair bilaterally   Closed patellar sleeve fracture of right knee 05/2019   Hypertension    Meniere's disease    Personal history of radiation therapy 2001   Left Breast Cancer    Objective: BP 126/74 (BP Location: Left Arm, Patient Position: Sitting, Cuff Size: Normal)   Pulse 75   Temp 98 F (36.7 C) (Oral)   Resp 16   Ht 5\' 1"  (1.549 m)   Wt 145 lb 6.4 oz (66 kg)   SpO2 98%   BMI 27.47 kg/m  General: Awake, appears stated age Heart: RRR, no LE edema Lungs: CTAB, no rales, wheezes or rhonchi. No accessory muscle use Abd: BS+, S, NT, ND Psych: Age appropriate judgment and insight, normal affect and mood  Assessment and Plan: Mixed incontinence urge and stress  Chronic, stable. Cont Enablex 7.5 mg/d. F/u in 6 mo.  The patient voiced understanding and agreement to the plan.  Jilda Roche [place], DO 03/19/23  3:21 PM

## 2023-03-19 NOTE — Patient Instructions (Signed)
Let me know if you need a refill.

## 2023-03-28 ENCOUNTER — Encounter: Payer: Self-pay | Admitting: Family Medicine

## 2023-03-28 ENCOUNTER — Ambulatory Visit (INDEPENDENT_AMBULATORY_CARE_PROVIDER_SITE_OTHER): Payer: Medicare Other | Admitting: Family Medicine

## 2023-03-28 VITALS — BP 110/78 | Ht 61.0 in | Wt 145.0 lb

## 2023-03-28 DIAGNOSIS — M545 Low back pain, unspecified: Secondary | ICD-10-CM | POA: Diagnosis not present

## 2023-03-28 DIAGNOSIS — R262 Difficulty in walking, not elsewhere classified: Secondary | ICD-10-CM

## 2023-03-28 DIAGNOSIS — M79642 Pain in left hand: Secondary | ICD-10-CM | POA: Diagnosis not present

## 2023-03-28 DIAGNOSIS — G8929 Other chronic pain: Secondary | ICD-10-CM | POA: Diagnosis not present

## 2023-03-28 MED ORDER — MELOXICAM 7.5 MG PO TABS: 7.5000 mg | ORAL_TABLET | Freq: Every day | ORAL | 1 refills | Status: DC

## 2023-03-28 NOTE — Progress Notes (Unsigned)
CHIEF COMPLAINT: No chief complaint on file.  _____________________________________________________________ SUBJECTIVE  HPI  Pt is a 77 y.o. female here for evaluation of Arthritis pain, back pain Chronic lower back pain that radiates into her bilateral lower extremities, left lower extremity feels heavy***  Thinks she may be due for it soon  Seen 01/01/2023  Has been using a support brace  Also with left knee pain  Seen 01/23/2023, received injection at that time. States it did not last her very long  03/26/22 tib/fib XR independently reviewed ***  Also with left trigger finger pain  Seen 01/23/2023, referred to hand surgeon States she tried reaching out to them and had difficulty getting in ***   When she wakes up she feels like all of her joints are more than stiff.   03/28/2022 ANA negative, CRP, ESR, CK negative  - 05/03/2022 saw orthopedics.  Note reviewed: She has pain in her left knee, right SI joint, and her low back.  She comes to my office today to talk about her low back pain that radiates into her bilateral lower extremities.  She says on the left lower extremity it feels like a heaviness in the entire leg with pain in the posterior and lateral thigh. On the right leg, she has pain that goes into the buttock and the posterior aspect of the thigh.  This pain is been present for several months.  There is no trauma or injury that brought on the pain.  Her primary care doctor recommended a ESI that she got within the last 3 weeks which provided her with significant relief of her low back and radiating leg pains.  She still has left knee pain and right SI joint pain.  She is now able to do her daily activities with a tolerable amount of pain.  Has paresthesias and numbness in her bilateral feet and a history of neuropathy.  No other numbness or paresthesias.   Weakness: Denies Symptoms of imbalance: Yes, from vertigo.  No other imbalance or new imbalance Paresthesias and numbness:  Yes, into bilateral feet.  No other numbness or paresthesias Bowel or bladder incontinence: Denies Saddle anesthesia: Denies   Treatments tried: Physical therapy, NSAIDs, Tylenol, activity modification, ESI  Lumbar x-ray demonstrated spondylolisthesis L4/L5.  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  Recommendations included activity modification, physical therapy, OTC pain medications, Medrol Dosepak, lumbar steroid injections  - Seen prior to orthopedics visit in our sports medicine clinic on 04/26/2022, had received left knee CSI, counseled on home exercise therapy, also had referral placed for epidural injection  09/09/2021 MRI: DDD at L4/5 and L5/S1.  Spondylolisthesis at L4/5.  Foraminal stenosis at L4/5 and L5/S1.  T2 signal within the L4-5 facet joints 1.9 mm.   Athlete/Sport:***   No chief complaint on file.  - Previous similar episode(s):  - Onset:  - Location:  - Duration:  - Character:  - Associated sx: - Alleviating factors:  - Aggravating factors:  - Therapies tried:    ------------------------------------------------------------------------------------------------------ Past Medical History:  Diagnosis Date   Allergy    Cancer (HCC)    Breast   Cataract    cataract repair bilaterally   Closed patellar sleeve fracture of right knee 05/2019   Hypertension    Meniere's disease    Personal history of radiation therapy 2001   Left Breast Cancer    Past Surgical History:  Procedure Laterality Date   BREAST BIOPSY Left 2001   BREAST LUMPECTOMY Left 01/25/2000   COLONOSCOPY  2006   VAGINAL HYSTERECTOMY  1991   Done for bleeding. Benign pathology      Outpatient Encounter Medications as of 03/28/2023  Medication Sig   amLODipine (NORVASC) 5 MG tablet Take 1 tablet by mouth once daily   Calcium Carb-Cholecalciferol (CALCIUM + D3 PO) Take 1 tablet by mouth daily.   darifenacin (ENABLEX) 7.5 MG 24 hr tablet Take 1 tablet (7.5 mg total) by mouth  daily.   DULoxetine (CYMBALTA) 60 MG capsule Take 1 capsule (60 mg total) by mouth daily.   Ferrous Sulfate (IRON PO) Take 1 tablet by mouth daily.   losartan (COZAAR) 100 MG tablet Take 1 tablet (100 mg total) by mouth daily.   meloxicam (MOBIC) 7.5 MG tablet TAKE 1 TABLET BY MOUTH AS NEEDED FOR PAIN   Multiple Vitamin (MULTIVITAMIN ADULT PO) Take 1 tablet by mouth daily.   OXcarbazepine (TRILEPTAL) 150 MG tablet Take 1 tablet by mouth twice daily   PSEUDOEPHEDRINE-DM PO Take 1 tablet by mouth daily.   RSV vaccine recomb adjuvanted (AREXVY) 120 MCG/0.5ML injection Inject into the muscle.   traZODone (DESYREL) 50 MG tablet TAKE 1 TO 2 TABLETS BY MOUTH AT BEDTIME AS NEEDED FOR SLEEP   valACYclovir (VALTREX) 500 MG tablet Take 2 tablets (1,000 mg total) by mouth daily.   VITAMIN K PO Take 1 tablet by mouth daily.   zinc gluconate 50 MG tablet Take 50 mg by mouth daily.   No facility-administered encounter medications on file as of 03/28/2023.    ------------------------------------------------------------------------------------------------------  _____________________________________________________________ OBJECTIVE  PHYSICAL EXAM  There were no vitals filed for this visit. There is no height or weight on file to calculate BMI.   reviewed  General: A+Ox3, no acute distress, well-nourished, appropriate affect CV: pulses 2+ regular, nondiaphoretic, no peripheral edema, cap refill <2sec Lungs: no audible wheezing, non-labored breathing, bilateral chest rise/fall, nontachypneic Skin: warm, well-perfused, non-icteric, no susp lesions or rashes Neuro: CN II-XII intact bilaterally. Sensation intact, muscle tone wnl, no atrophy Psych: no signs of depression or anxiety MSK: ***    Back Exam:  Posture normal; rotation of spine elicits *** pain; SI joints palpated w/*** pain; Tenderness to palpation over *** Stork Test negative SI joint alignment *** Straight Leg Raise Test negative,  FABER neg, FADIR neg  Right to {NUMBERS; 0-180 BY 10:15935} degrees while {SLR position:54034} with {SLR pain:54035} Left to {NUMBERS; 0-180 BY 10:15935} degrees while {SLR position:54034} with {SLR pain:54035} Muscle wasting: {None/mild/mod/significant:11092} *** Hip: No deformity, swelling or wasting ROM Flexion 90, ext 30, IR 45, ER 45 Negative trendelenberg NTTP over the hip flexors, adductors, inguinal canal, pubic ramus, greater troch, glute musculature, si joint, lumbar spine SIJ rigidity *** *** leg length discrepancy Negative log roll with FROM Negative FABER Negative FADIR Negative Piriformis test Negative Ober test Negative axial loading, scrub test  Gait {GAIT:23234}   _____________________________________________________________ ASSESSMENT/PLAN There are no diagnoses linked to this encounter.  Summary: ***  Differential Diagnosis:  Treatment:   Significant paraspinal hypertonicity, gluteal weakness  Counseled on frequency of epidural injections, welcome to contact to place referral when ready  Hasn't done exercises since the summer Declines PT re-evaluation  6-8 week follow-up =======  Utilize walker in store room Ambulatory dysfunction Some word finding difficulties  [place] menierre's  Hand arthritis Paraffin wax OT offered, deferred No follow-ups on file.  Electronically signed by: Burna Forts, MD 03/28/2023 12:03 PM

## 2023-03-29 ENCOUNTER — Ambulatory Visit: Payer: Medicare Other

## 2023-03-29 VITALS — Ht 61.0 in | Wt 145.0 lb

## 2023-03-29 DIAGNOSIS — Z0000 Encounter for general adult medical examination without abnormal findings: Secondary | ICD-10-CM | POA: Diagnosis not present

## 2023-03-29 NOTE — Progress Notes (Signed)
Subjective:   Carolyn Mckinney is a 77 y.o. female who presents for Medicare Annual (Subsequent) preventive examination.  Visit Complete: Virtual I connected with  Lura Em on 03/29/23 by a audio enabled telemedicine application and verified that I am speaking with the correct person using two identifiers.  Patient Location: Home  Provider Location: Home Office  I discussed the limitations of evaluation and management by telemedicine. The patient expressed understanding and agreed to proceed.  Vital Signs: Because this visit was a virtual/telehealth visit, some criteria may be missing or patient reported. Any vitals not documented were not able to be obtained and vitals that have been documented are patient reported.  Patient Medicare AWV questionnaire was completed by the patient on 03/24/23; I have confirmed that all information answered by patient is correct and no changes since this date.  Cardiac Risk Factors include: advanced age (>93men, >40 women);hypertension     Objective:    Today's Vitals   03/29/23 1359  Weight: 145 lb (65.8 kg)  Height: 5\' 1"  (1.549 m)   Body mass index is 27.4 kg/m.     03/29/2023    2:08 PM 02/24/2022    1:26 PM 09/07/2021    2:00 PM 03/10/2021    1:07 PM 08/30/2020    9:03 PM 01/23/2020    3:18 PM 10/15/2019    2:39 PM  Advanced Directives  Does Patient Have a Medical Advance Directive? Yes Yes Yes Yes Yes Yes Yes  Type of Estate agent of [place];Living will Healthcare Power of [place];Living will;Out of facility DNR (pink MOST or yellow form) Healthcare Power of [place];Living will Healthcare Power of [place];Living will Healthcare Power of [place];Living will Healthcare Power of [place];Out of facility DNR (pink MOST or yellow form);Living will Healthcare Power of [place];Living will  Does patient want to make changes to medical advance directive? No - Patient declined  No - Patient declined  No - Patient  declined  No - Patient declined  Copy of Healthcare Power of Attorney in Chart? Yes - validated most recent copy scanned in chart (See row information)  Yes - validated most recent copy scanned in chart (See row information) No - copy requested No - copy requested  No - copy requested    Current Medications (verified) Outpatient Encounter Medications as of 03/29/2023  Medication Sig   amLODipine (NORVASC) 5 MG tablet Take 1 tablet by mouth once daily   Calcium Carb-Cholecalciferol (CALCIUM + D3 PO) Take 1 tablet by mouth daily.   darifenacin (ENABLEX) 7.5 MG 24 hr tablet Take 1 tablet (7.5 mg total) by mouth daily.   DULoxetine (CYMBALTA) 60 MG capsule Take 1 capsule (60 mg total) by mouth daily.   Ferrous Sulfate (IRON PO) Take 1 tablet by mouth daily.   losartan (COZAAR) 100 MG tablet Take 1 tablet (100 mg total) by mouth daily.   meloxicam (MOBIC) 7.5 MG tablet Take 1 tablet (7.5 mg total) by mouth daily.   Multiple Vitamin (MULTIVITAMIN ADULT PO) Take 1 tablet by mouth daily.   OXcarbazepine (TRILEPTAL) 150 MG tablet Take 1 tablet by mouth twice daily   PSEUDOEPHEDRINE-DM PO Take 1 tablet by mouth daily.   RSV vaccine recomb adjuvanted (AREXVY) 120 MCG/0.5ML injection Inject into the muscle.   traZODone (DESYREL) 50 MG tablet TAKE 1 TO 2 TABLETS BY MOUTH AT BEDTIME AS NEEDED FOR SLEEP   valACYclovir (VALTREX) 500 MG tablet Take 2 tablets (1,000 mg total) by mouth daily.   VITAMIN K PO Take  1 tablet by mouth daily.   zinc gluconate 50 MG tablet Take 50 mg by mouth daily.   No facility-administered encounter medications on file as of 03/29/2023.    Allergies (verified) Patient has no known allergies.   History: Past Medical History:  Diagnosis Date   Allergy    Cancer (HCC)    Breast   Cataract    cataract repair bilaterally   Closed patellar sleeve fracture of right knee 05/2019   Hypertension    Meniere's disease    Personal history of radiation therapy 2001   Left Breast  Cancer   Past Surgical History:  Procedure Laterality Date   BREAST BIOPSY Left 2001   BREAST LUMPECTOMY Left 01/25/2000   COLONOSCOPY  2006   VAGINAL HYSTERECTOMY  1991   Done for bleeding. Benign pathology   Family History  Problem Relation Age of Onset   Arthritis Mother    Heart disease Father    Colon cancer Neg Hx    Colon polyps Neg Hx    Esophageal cancer Neg Hx    Rectal cancer Neg Hx    Stomach cancer Neg Hx    Social History   Socioeconomic History   Marital status: Widowed    Spouse name: Not on file   Number of children: Not on file   Years of education: Not on file   Highest education level: Master's degree (e.g., MA, MS, MEng, MEd, MSW, MBA)  Occupational History   Not on file  Tobacco Use   Smoking status: Never   Smokeless tobacco: Never  Vaping Use   Vaping status: Never Used  Substance and Sexual Activity   Alcohol use: Yes    Comment: just wine/glass of wine in the evening   Drug use: Never   Sexual activity: Not Currently  Other Topics Concern   Not on file  Social History Narrative   Left Handed   One Story Home   Drinks Caffeine    Social Determinants of Health   Financial Resource Strain: Low Risk  (03/24/2023)   Overall Financial Resource Strain (CARDIA)    Difficulty of Paying Living Expenses: Not hard at all  Food Insecurity: No Food Insecurity (03/24/2023)   Hunger Vital Sign    Worried About Running Out of Food in the Last Year: Never true    Ran Out of Food in the Last Year: Never true  Transportation Needs: No Transportation Needs (03/24/2023)   PRAPARE - Administrator, Civil Service (Medical): No    Lack of Transportation (Non-Medical): No  Physical Activity: Insufficiently Active (03/24/2023)   Exercise Vital Sign    Days of Exercise per Week: 2 days    Minutes of Exercise per Session: 40 min  Stress: No Stress Concern Present (03/24/2023)   [employer] of Occupational Health - Occupational Stress  Questionnaire    Feeling of Stress : Not at all  Social Connections: Moderately Integrated (03/24/2023)   Social Connection and Isolation Panel [NHANES]    Frequency of Communication with Friends and Family: More than three times a week    Frequency of Social Gatherings with Friends and Family: Three times a week    Attends Religious Services: More than 4 times per year    Active Member of Clubs or Organizations: Yes    Attends Banker Meetings: More than 4 times per year    Marital Status: Widowed    Tobacco Counseling Counseling given: Not Answered   Clinical Intake:  Pre-visit preparation completed: Yes  Pain : No/denies pain     BMI - recorded: 27.4 Nutritional Status: BMI 25 -29 Overweight Nutritional Risks: None Diabetes: No  How often do you need to have someone help you when you read instructions, pamphlets, or other written materials from your doctor or pharmacy?: 1 - Never  Interpreter Needed?: No  Information entered by :: Theresa Mulligan LPN   Activities of Daily Living    03/24/2023    9:09 AM  In your present state of health, do you have any difficulty performing the following activities:  Hearing? 0  Vision? 0  Difficulty concentrating or making decisions? 0  Walking or climbing stairs? 0  Dressing or bathing? 0  Doing errands, shopping? 0  Preparing Food and eating ? N  Using the Toilet? N  In the past six months, have you accidently leaked urine? Y  Comment Followed by PCP. Wears protection at night.  Do you have problems with loss of bowel control? N  Managing your Medications? N  Managing your Finances? N  Housekeeping or managing your Housekeeping? N    Patient Care Team: Sharlene Dory, DO as PCP - General (Family Medicine) Glendale Chard, DO as Consulting Physician (Neurology)  Indicate any recent Medical Services you may have received from other than [facility] providers in the past year (date may be approximate).      Assessment:   This is a routine wellness examination for Carolyn Mckinney.  Hearing/Vision screen Hearing Screening - Comments:: Denies hearing difficulties   Vision Screening - Comments:: Wears rx glasses - up to date with routine eye exams with  Dr Martha Clan   Goals Addressed               This Visit's Progress     Patient Stated (pt-stated)        Maintain current health & independence!       Depression Screen    03/29/2023    2:03 PM 03/19/2023    2:58 PM 03/21/2022   11:33 AM 03/10/2021    1:16 PM 09/22/2020   11:23 AM 10/15/2019    2:43 PM  PHQ 2/9 Scores  PHQ - 2 Score 0 0 0 0 0 0    Fall Risk    03/29/2023    2:07 PM 03/24/2023    9:09 AM 03/19/2023    2:58 PM 10/06/2022    2:34 PM 03/21/2022   11:33 AM  Fall Risk   Falls in the past year? 1 1 0 1 1  Number falls in past yr: 0 1 0 1 1  Injury with Fall? 0 0 0 1 1  Risk for fall due to : No Fall Risks    Other (Comment);History of fall(s)  Risk for fall due to: Comment     Pt fell in an airport twice, tripped on the rugs  Follow up Falls prevention discussed  Falls evaluation completed  Falls evaluation completed    MEDICARE RISK AT HOME: Medicare Risk at Home Any stairs in or around the home?: No If so, are there any without handrails?: No Home free of loose throw rugs in walkways, pet beds, electrical cords, etc?: Yes Adequate lighting in your home to reduce risk of falls?: Yes Life alert?: Yes Use of a cane, walker or w/c?: No Grab bars in the bathroom?: Yes Shower chair or bench in shower?: Yes Elevated toilet seat or a handicapped toilet?: Yes  TIMED UP AND GO:  Was the test  performed?  No    Cognitive Function:        03/29/2023    2:08 PM  6CIT Screen  What Year? 0 points  What month? 0 points  What time? 0 points  Count back from 20 0 points  Months in reverse 0 points  Repeat phrase 0 points  Total Score 0 points    Immunizations Immunization History  Administered Date(s) Administered    Fluad Quad(high Dose 65+) 02/06/2020   Fluad Trivalent(High Dose 65+) 01/23/2023   Influenza,inj,Quad PF,6+ Mos 12/19/2018   Influenza-Unspecified 12/20/2021   PFIZER Comirnaty(Gray Top)Covid-19 Tri-Sucrose Vaccine 08/11/2020, 03/07/2022   PFIZER(Purple Top)SARS-COV-2 Vaccination 06/22/2019, 07/22/2019, 02/09/2020   PNEUMOCOCCAL CONJUGATE-20 12/20/2021   Pfizer Covid-19 Vaccine Bivalent Booster 58yrs & up 05/03/2021   Pfizer(Comirnaty)Fall Seasonal Vaccine 12 years and older 03/07/2022   Pneumococcal Polysaccharide-23 05/02/2018   Respiratory Syncytial Virus Vaccine,Recomb Aduvanted(Arexvy) 10/06/2022   Tdap 05/02/2018   Zoster Recombinant(Shingrix) 05/03/2021, 08/26/2021    TDAP status: Up to date  Flu Vaccine status: Up to date  Pneumococcal vaccine status: Up to date  Covid-19 vaccine status: Declined, Education has been provided regarding the importance of this vaccine but patient still declined. Advised may receive this vaccine at local pharmacy or Health Dept.or vaccine clinic. Aware to provide a copy of the vaccination record if obtained from local pharmacy or Health Dept. Verbalized acceptance and understanding.  Qualifies for Shingles Vaccine? Yes   Zostavax completed Yes   Shingrix Completed?: Yes  Screening Tests Health Maintenance  Topic Date Due   COVID-19 Vaccine (7 - 2023-24 season) 09/16/2023 (Originally 12/24/2022)   Medicare Annual Wellness (AWV)  03/28/2024   DTaP/Tdap/Td (2 - Td or Tdap) 05/02/2028   Pneumonia Vaccine 61+ Years old  Completed   INFLUENZA VACCINE  Completed   DEXA SCAN  Completed   Hepatitis C Screening  Completed   Zoster Vaccines- Shingrix  Completed   HPV VACCINES  Aged Out   Colonoscopy  Discontinued    Health Maintenance  There are no preventive care reminders to display for this patient.       Bone Density status: Completed 10/21/19. Results reflect: Bone density results: OSTEOPENIA. Repeat every   years.     Additional  Screening:  Hepatitis C Screening: does qualify; Completed 05/25/93  Vision Screening: Recommended annual ophthalmology exams for early detection of glaucoma and other disorders of the eye. Is the patient up to date with their annual eye exam?  Yes  Who is the provider or what is the name of the office in which the patient attends annual eye exams? Dr Martha Clan If pt is not established with a provider, would they like to be referred to a provider to establish care? No .   Dental Screening: Recommended annual dental exams for proper oral hygiene   Community Resource Referral / Chronic Care Management:  CRR required this visit?  No   CCM required this visit?  No     Plan:     I have personally reviewed and noted the following in the patient's chart:   Medical and social history Use of alcohol, tobacco or illicit drugs  Current medications and supplements including opioid prescriptions. Patient is not currently taking opioid prescriptions. Functional ability and status Nutritional status Physical activity Advanced directives List of other physicians Hospitalizations, surgeries, and ER visits in previous 12 months Vitals Screenings to include cognitive, depression, and falls Referrals and appointments  In addition, I have reviewed and discussed with patient certain preventive protocols,  quality metrics, and best practice recommendations. A written personalized care plan for preventive services as well as general preventive health recommendations were provided to patient.     Tillie Rung, LPN   [id number]/12/[id number]   After Visit Summary: (MyChart) Due to this being a telephonic visit, the after visit summary with patients personalized plan was offered to patient via MyChart   Nurse Notes: None

## 2023-03-29 NOTE — Patient Instructions (Addendum)
Carolyn Mckinney , Thank you for taking time to come for your Medicare Wellness Visit. I appreciate your ongoing commitment to your health goals. Please review the following plan we discussed and let me know if I can assist you in the future.   Referrals/Orders/Follow-Ups/Clinician Recommendations:   This is a list of the screening recommended for you and due dates:  Health Maintenance  Topic Date Due   COVID-19 Vaccine (7 - 2023-24 season) 09/16/2023*   Medicare Annual Wellness Visit  03/28/2024   DTaP/Tdap/Td vaccine (2 - Td or Tdap) 05/02/2028   Pneumonia Vaccine  Completed   Flu Shot  Completed   DEXA scan (bone density measurement)  Completed   Hepatitis C Screening  Completed   Zoster (Shingles) Vaccine  Completed   HPV Vaccine  Aged Out   Colon Cancer Screening  Discontinued  *Topic was postponed. The date shown is not the original due date.    Advanced directives: (In Chart) A copy of your advanced directives are scanned into your chart should your provider ever need it.  Next Medicare Annual Wellness Visit scheduled for next year: Yes

## 2023-04-23 ENCOUNTER — Other Ambulatory Visit: Payer: Self-pay | Admitting: Family Medicine

## 2023-04-23 DIAGNOSIS — R202 Paresthesia of skin: Secondary | ICD-10-CM

## 2023-04-26 ENCOUNTER — Encounter: Payer: Self-pay | Admitting: Family Medicine

## 2023-04-26 NOTE — Telephone Encounter (Signed)
 Are you able to help with this?

## 2023-05-07 ENCOUNTER — Other Ambulatory Visit: Payer: Self-pay | Admitting: Family Medicine

## 2023-05-07 DIAGNOSIS — M5416 Radiculopathy, lumbar region: Secondary | ICD-10-CM

## 2023-05-07 NOTE — Progress Notes (Signed)
Order for [facility]

## 2023-05-08 ENCOUNTER — Ambulatory Visit
Admission: RE | Admit: 2023-05-08 | Discharge: 2023-05-08 | Disposition: A | Discharge status: Home / Self Care | Payer: Medicare Other | Source: Ambulatory Visit | Attending: Family Medicine

## 2023-05-08 DIAGNOSIS — M5416 Radiculopathy, lumbar region: Secondary | ICD-10-CM

## 2023-05-08 DIAGNOSIS — M4727 Other spondylosis with radiculopathy, lumbosacral region: Secondary | ICD-10-CM | POA: Diagnosis not present

## 2023-05-08 MED ORDER — METHYLPREDNISOLONE ACETATE 40 MG/ML INJ SUSP (RADIOLOG
80.0000 mg | Freq: Once | INTRAMUSCULAR | Status: AC
  Administered 2023-05-08: 80 mg via EPIDURAL

## 2023-05-08 MED ORDER — IOPAMIDOL (ISOVUE-M 200) INJECTION 41%
1.0000 mL | Freq: Once | INTRAMUSCULAR | Status: AC
  Administered 2023-05-08: 1 mL via EPIDURAL

## 2023-05-08 NOTE — Discharge Instructions (Signed)

## 2023-05-11 ENCOUNTER — Other Ambulatory Visit: Payer: Self-pay | Admitting: Family Medicine

## 2023-05-11 MED ORDER — SOLIFENACIN SUCCINATE 5 MG PO TABS: 5.0000 mg | ORAL_TABLET | Freq: Every day | ORAL | 2 refills | Status: DC

## 2023-05-11 NOTE — Telephone Encounter (Signed)
Called pt and was advised. She stated would like to try it. Follow up appt scheduled.

## 2023-05-22 ENCOUNTER — Encounter: Payer: Self-pay | Admitting: Family Medicine

## 2023-05-22 ENCOUNTER — Ambulatory Visit (INDEPENDENT_AMBULATORY_CARE_PROVIDER_SITE_OTHER): Payer: Medicare Other | Admitting: Family Medicine

## 2023-05-22 VITALS — BP 124/74 | HR 74 | Temp 98.0°F | Resp 16 | Ht 61.0 in | Wt 143.4 lb

## 2023-05-22 DIAGNOSIS — N3946 Mixed incontinence: Secondary | ICD-10-CM | POA: Diagnosis not present

## 2023-05-22 NOTE — Progress Notes (Signed)
Chief Complaint  Patient presents with   Follow-up    Follow up    Subjective: Patient is a 78 y.o. female here for f/u OAB.  Longstanding history of overactive bladder/mixed incontinence.  Mybetriq and darifenacin did not help and were very expensive. She was sent Vesicare 5 mg/d. Took one dose yesterday. Still having freq/urgency, particularly when she stands up. No recent incontinence.   Past Medical History:  Diagnosis Date   Allergy    Cancer [facility])    Breast   Cataract    cataract repair bilaterally   Closed patellar sleeve fracture of right knee 05/2019   Hypertension    Meniere's disease    Personal history of radiation therapy 2001   Left Breast Cancer    Objective: BP 124/74   Pulse 74   Temp 98 F (36.7 C) (Oral)   Resp 16   Ht 5\' 1"  (1.549 m)   Wt 143 lb 6.4 oz (65 kg)   SpO2 95%   BMI 27.10 kg/m  General: Awake, appears stated age Heart: RRR, no LE edema Lungs: CTAB, no rales, wheezes or rhonchi. No accessory muscle use Abdomen: Bowel sounds present, soft, nontender, nondistended Psych: Age appropriate judgment and insight, normal affect and mood  Assessment and Plan: Mixed incontinence urge and stress  Chronic, not controlled.  She is currently taking Vesicare 5 mg daily which she will continue for the next week and a half and then send her message.  Not controlled, would consider increasing to 10 mg daily versus referring to urogyn.  She has failed Myrbetriq and darifenacin. The patient voiced understanding and agreement to the plan.  Jilda Roche [place], DO 05/22/23  2:59 PM

## 2023-05-22 NOTE — Patient Instructions (Signed)
Send me a message in 1.5 weeks with how you are doing on the Vesicare.

## 2023-06-01 DIAGNOSIS — H18421 Band keratopathy, right eye: Secondary | ICD-10-CM | POA: Diagnosis not present

## 2023-06-01 DIAGNOSIS — H16223 Keratoconjunctivitis sicca, not specified as Sjogren's, bilateral: Secondary | ICD-10-CM | POA: Diagnosis not present

## 2023-06-01 DIAGNOSIS — H18413 Arcus senilis, bilateral: Secondary | ICD-10-CM | POA: Diagnosis not present

## 2023-06-01 DIAGNOSIS — H43811 Vitreous degeneration, right eye: Secondary | ICD-10-CM | POA: Diagnosis not present

## 2023-06-04 ENCOUNTER — Other Ambulatory Visit: Payer: Self-pay

## 2023-06-04 DIAGNOSIS — G4700 Insomnia, unspecified: Secondary | ICD-10-CM

## 2023-06-04 MED ORDER — TRAZODONE HCL 50 MG PO TABS
50.0000 mg | ORAL_TABLET | Freq: Every evening | ORAL | 3 refills | Status: DC | PRN
Start: 2023-06-04 — End: 2023-10-24

## 2023-06-07 ENCOUNTER — Other Ambulatory Visit: Payer: Self-pay | Admitting: Family Medicine

## 2023-06-11 ENCOUNTER — Other Ambulatory Visit: Payer: Self-pay | Admitting: Family Medicine

## 2023-06-13 ENCOUNTER — Encounter: Payer: Self-pay | Admitting: Family Medicine

## 2023-06-29 ENCOUNTER — Emergency Department (HOSPITAL_COMMUNITY)

## 2023-06-29 ENCOUNTER — Encounter (HOSPITAL_COMMUNITY): Payer: Self-pay | Admitting: Emergency Medicine

## 2023-06-29 ENCOUNTER — Ambulatory Visit: Payer: Self-pay | Admitting: Family Medicine

## 2023-06-29 ENCOUNTER — Inpatient Hospital Stay (HOSPITAL_COMMUNITY)
Admission: EM | Admit: 2023-06-29 | Discharge: 2023-07-08 | DRG: 871 | Disposition: A | Discharge status: Home / Self Care | Attending: Internal Medicine | Admitting: Internal Medicine

## 2023-06-29 DIAGNOSIS — J189 Pneumonia, unspecified organism: Secondary | ICD-10-CM | POA: Diagnosis not present

## 2023-06-29 DIAGNOSIS — I10 Essential (primary) hypertension: Secondary | ICD-10-CM | POA: Diagnosis not present

## 2023-06-29 DIAGNOSIS — Z79899 Other long term (current) drug therapy: Secondary | ICD-10-CM | POA: Diagnosis not present

## 2023-06-29 DIAGNOSIS — R7989 Other specified abnormal findings of blood chemistry: Secondary | ICD-10-CM | POA: Diagnosis present

## 2023-06-29 DIAGNOSIS — Z9842 Cataract extraction status, left eye: Secondary | ICD-10-CM | POA: Diagnosis not present

## 2023-06-29 DIAGNOSIS — E871 Hypo-osmolality and hyponatremia: Secondary | ICD-10-CM | POA: Diagnosis present

## 2023-06-29 DIAGNOSIS — Z90710 Acquired absence of both cervix and uterus: Secondary | ICD-10-CM | POA: Diagnosis not present

## 2023-06-29 DIAGNOSIS — J90 Pleural effusion, not elsewhere classified: Secondary | ICD-10-CM | POA: Diagnosis not present

## 2023-06-29 DIAGNOSIS — A419 Sepsis, unspecified organism: Secondary | ICD-10-CM | POA: Diagnosis not present

## 2023-06-29 DIAGNOSIS — D72829 Elevated white blood cell count, unspecified: Secondary | ICD-10-CM | POA: Diagnosis present

## 2023-06-29 DIAGNOSIS — I517 Cardiomegaly: Secondary | ICD-10-CM | POA: Diagnosis not present

## 2023-06-29 DIAGNOSIS — I272 Pulmonary hypertension, unspecified: Secondary | ICD-10-CM | POA: Diagnosis not present

## 2023-06-29 DIAGNOSIS — R42 Dizziness and giddiness: Secondary | ICD-10-CM | POA: Diagnosis not present

## 2023-06-29 DIAGNOSIS — K5900 Constipation, unspecified: Secondary | ICD-10-CM | POA: Diagnosis present

## 2023-06-29 DIAGNOSIS — R590 Localized enlarged lymph nodes: Secondary | ICD-10-CM | POA: Diagnosis not present

## 2023-06-29 DIAGNOSIS — R0600 Dyspnea, unspecified: Secondary | ICD-10-CM | POA: Diagnosis not present

## 2023-06-29 DIAGNOSIS — R519 Headache, unspecified: Secondary | ICD-10-CM | POA: Diagnosis not present

## 2023-06-29 DIAGNOSIS — R110 Nausea: Secondary | ICD-10-CM | POA: Diagnosis not present

## 2023-06-29 DIAGNOSIS — C50912 Malignant neoplasm of unspecified site of left female breast: Secondary | ICD-10-CM | POA: Diagnosis not present

## 2023-06-29 DIAGNOSIS — R6520 Severe sepsis without septic shock: Secondary | ICD-10-CM | POA: Diagnosis present

## 2023-06-29 DIAGNOSIS — R0902 Hypoxemia: Secondary | ICD-10-CM | POA: Diagnosis not present

## 2023-06-29 DIAGNOSIS — Z8261 Family history of arthritis: Secondary | ICD-10-CM

## 2023-06-29 DIAGNOSIS — F32A Depression, unspecified: Secondary | ICD-10-CM | POA: Diagnosis present

## 2023-06-29 DIAGNOSIS — R7982 Elevated C-reactive protein (CRP): Secondary | ICD-10-CM | POA: Diagnosis present

## 2023-06-29 DIAGNOSIS — Z923 Personal history of irradiation: Secondary | ICD-10-CM

## 2023-06-29 DIAGNOSIS — R0603 Acute respiratory distress: Secondary | ICD-10-CM | POA: Diagnosis not present

## 2023-06-29 DIAGNOSIS — Z8249 Family history of ischemic heart disease and other diseases of the circulatory system: Secondary | ICD-10-CM

## 2023-06-29 DIAGNOSIS — Z791 Long term (current) use of non-steroidal anti-inflammatories (NSAID): Secondary | ICD-10-CM

## 2023-06-29 DIAGNOSIS — Z9841 Cataract extraction status, right eye: Secondary | ICD-10-CM

## 2023-06-29 DIAGNOSIS — M65342 Trigger finger, left ring finger: Secondary | ICD-10-CM | POA: Diagnosis present

## 2023-06-29 DIAGNOSIS — I2729 Other secondary pulmonary hypertension: Secondary | ICD-10-CM | POA: Diagnosis not present

## 2023-06-29 DIAGNOSIS — R202 Paresthesia of skin: Secondary | ICD-10-CM | POA: Diagnosis present

## 2023-06-29 DIAGNOSIS — R918 Other nonspecific abnormal finding of lung field: Secondary | ICD-10-CM | POA: Diagnosis not present

## 2023-06-29 DIAGNOSIS — J9601 Acute respiratory failure with hypoxia: Secondary | ICD-10-CM | POA: Diagnosis present

## 2023-06-29 DIAGNOSIS — Z853 Personal history of malignant neoplasm of breast: Secondary | ICD-10-CM

## 2023-06-29 DIAGNOSIS — Z1152 Encounter for screening for COVID-19: Secondary | ICD-10-CM

## 2023-06-29 DIAGNOSIS — R0602 Shortness of breath: Secondary | ICD-10-CM | POA: Diagnosis not present

## 2023-06-29 DIAGNOSIS — J984 Other disorders of lung: Secondary | ICD-10-CM | POA: Diagnosis not present

## 2023-06-29 DIAGNOSIS — R059 Cough, unspecified: Secondary | ICD-10-CM | POA: Diagnosis not present

## 2023-06-29 DIAGNOSIS — R531 Weakness: Secondary | ICD-10-CM | POA: Diagnosis not present

## 2023-06-29 LAB — RESP PANEL BY RT-PCR (RSV, FLU A&B, COVID)  RVPGX2
Influenza A by PCR: NEGATIVE
Influenza B by PCR: NEGATIVE
Resp Syncytial Virus by PCR: NEGATIVE
SARS Coronavirus 2 by RT PCR: NEGATIVE

## 2023-06-29 LAB — COMPREHENSIVE METABOLIC PANEL
ALT: 13 U/L (ref 0–44)
AST: 20 U/L (ref 15–41)
Albumin: 3.1 g/dL — ABNORMAL LOW (ref 3.5–5.0)
Alkaline Phosphatase: 66 U/L (ref 38–126)
Anion gap: 8 (ref 5–15)
BUN: 15 mg/dL (ref 8–23)
CO2: 28 mmol/L (ref 22–32)
Calcium: 9.2 mg/dL (ref 8.9–10.3)
Chloride: 97 mmol/L — ABNORMAL LOW (ref 98–111)
Creatinine, Ser: 0.73 mg/dL (ref 0.44–1.00)
GFR, Estimated: >60 mL/min (ref >60)
Glucose, Bld: 100 mg/dL — ABNORMAL HIGH (ref 70–99)
Potassium: 3.9 mmol/L (ref 3.5–5.1)
Sodium: 133 mmol/L — ABNORMAL LOW (ref 135–145)
Total Bilirubin: 1 mg/dL (ref 0.0–1.2)
Total Protein: 6.2 g/dL — ABNORMAL LOW (ref 6.5–8.1)

## 2023-06-29 LAB — CBC WITH DIFFERENTIAL/PLATELET
Abs Immature Granulocytes: 0.07 10*3/uL (ref 0.00–0.07)
Basophils Absolute: 0.1 10*3/uL (ref 0.0–0.1)
Basophils Relative: 1 %
Eosinophils Absolute: 0.2 10*3/uL (ref 0.0–0.5)
Eosinophils Relative: 1 %
HCT: 37.1 % (ref 36.0–46.0)
Hemoglobin: 12.2 g/dL (ref 12.0–15.0)
Immature Granulocytes: 1 %
Lymphocytes Relative: 12 %
Lymphs Abs: 1.7 10*3/uL (ref 0.7–4.0)
MCH: 33.3 pg (ref 26.0–34.0)
MCHC: 32.9 g/dL (ref 30.0–36.0)
MCV: 101.4 fL — ABNORMAL HIGH (ref 80.0–100.0)
Monocytes Absolute: 1.6 10*3/uL — ABNORMAL HIGH (ref 0.1–1.0)
Monocytes Relative: 12 %
Neutro Abs: 10.1 10*3/uL — ABNORMAL HIGH (ref 1.7–7.7)
Neutrophils Relative %: 73 %
Platelets: 355 10*3/uL (ref 150–400)
RBC: 3.66 MIL/uL — ABNORMAL LOW (ref 3.87–5.11)
RDW: 12.5 % (ref 11.5–15.5)
WBC: 13.7 10*3/uL — ABNORMAL HIGH (ref 4.0–10.5)
nRBC: 0 % (ref 0.0–0.2)

## 2023-06-29 LAB — TROPONIN I (HIGH SENSITIVITY)
Troponin I (High Sensitivity): 26 ng/L — ABNORMAL HIGH (ref <18)
Troponin I (High Sensitivity): 22 ng/L — ABNORMAL HIGH (ref <18)

## 2023-06-29 LAB — LACTIC ACID, PLASMA: Lactic Acid, Venous: 1.6 mmol/L (ref 0.5–1.9)

## 2023-06-29 LAB — BRAIN NATRIURETIC PEPTIDE: B Natriuretic Peptide: 339.7 pg/mL — ABNORMAL HIGH (ref 0.0–100.0)

## 2023-06-29 MED ORDER — MELATONIN 3 MG PO TABS
3.0000 mg | ORAL_TABLET | Freq: Every evening | ORAL | Status: DC | PRN
  Administered 2023-07-06 – 2023-07-07 (×2): 3 mg via ORAL
  Filled 2023-06-29 (×2): qty 1

## 2023-06-29 MED ORDER — ACETAMINOPHEN 325 MG PO TABS
650.0000 mg | ORAL_TABLET | Freq: Four times a day (QID) | ORAL | Status: DC | PRN
  Administered 2023-07-03: 650 mg via ORAL
  Filled 2023-06-29 (×2): qty 2

## 2023-06-29 MED ORDER — SODIUM CHLORIDE 0.9 % IV SOLN
1.0000 g | Freq: Once | INTRAVENOUS | Status: AC
  Administered 2023-06-29: 1 g via INTRAVENOUS
  Filled 2023-06-29: qty 10

## 2023-06-29 MED ORDER — ONDANSETRON HCL 4 MG/2ML IJ SOLN
4.0000 mg | Freq: Four times a day (QID) | INTRAMUSCULAR | Status: DC | PRN
  Administered 2023-07-01: 4 mg via INTRAVENOUS
  Filled 2023-06-29 (×3): qty 2

## 2023-06-29 MED ORDER — ACETAMINOPHEN 650 MG RE SUPP: 650.0000 mg | Freq: Four times a day (QID) | RECTAL | Status: DC | PRN

## 2023-06-29 MED ORDER — FUROSEMIDE 10 MG/ML IJ SOLN
20.0000 mg | Freq: Once | INTRAMUSCULAR | Status: AC
  Administered 2023-06-29: 20 mg via INTRAVENOUS
  Filled 2023-06-29: qty 4

## 2023-06-29 MED ORDER — SODIUM CHLORIDE 0.9 % IV SOLN
500.0000 mg | Freq: Once | INTRAVENOUS | Status: AC
  Administered 2023-06-29: 500 mg via INTRAVENOUS
  Filled 2023-06-29: qty 5

## 2023-06-29 MED ORDER — IOHEXOL 350 MG/ML SOLN
75.0000 mL | Freq: Once | INTRAVENOUS | Status: AC | PRN
  Administered 2023-06-29: 75 mL via INTRAVENOUS

## 2023-06-29 NOTE — ED Provider Notes (Signed)
 [place] EMERGENCY DEPARTMENT AT [facility] Provider Note   CSN: [id number] Arrival date & time: 06/29/23  1648     History  No chief complaint on file.   Carolyn Mckinney is a 78 y.o. female.  HPI   78 year old female presents emergency department with concern for hypoxia.  Patient states for the past couple weeks she has been dealing with sinus and upper respiratory symptoms including congestion, productive cough, fatigue.  She states the symptoms have waxed and waned but not resolved.  She went to urgent care today due to significant weakness and exertional shortness of breath and was noted to be hypoxic on room air which is her baseline.  She was placed on nasal cannula and referred here by ambulance.  She endorses mild nausea but denies any hemoptysis, vomiting, diarrhea, leg swelling.  Denies any history of heart failure.  No positional chest discomfort/orthopnea.  Home Medications Prior to Admission medications   Medication Sig Start Date End Date Taking? Authorizing Provider  amLODipine (NORVASC) 5 MG tablet Take 1 tablet (5 mg total) by mouth daily. 06/07/23   Sharlene Dory, DO  Calcium Carb-Cholecalciferol (CALCIUM + D3 PO) Take 1 tablet by mouth daily.    [provider]  DULoxetine (CYMBALTA) 60 MG capsule Take 1 capsule (60 mg total) by mouth 2 (two) times daily. 04/23/23   Sharlene Dory, DO  Ferrous Sulfate (IRON PO) Take 1 tablet by mouth daily.    [provider]  losartan (COZAAR) 100 MG tablet Take 1 tablet (100 mg total) by mouth daily. 01/17/23   Sharlene Dory, DO  meloxicam (MOBIC) 7.5 MG tablet Take 1 tablet by mouth once daily 06/19/23   Ralene Cork, DO  Multiple Vitamin (MULTIVITAMIN ADULT PO) Take 1 tablet by mouth daily.    [provider]  OXcarbazepine (TRILEPTAL) 150 MG tablet Take 1 tablet by mouth twice daily 01/10/23   Carmelia Roller, Jilda Roche, DO  solifenacin (VESICARE) 5 MG tablet Take  1 tablet (5 mg total) by mouth daily. 05/11/23   Sharlene Dory, DO  traZODone (DESYREL) 50 MG tablet Take 1-2 tablets (50-100 mg total) by mouth at bedtime as needed. for sleep 06/04/23   Sharlene Dory, DO  valACYclovir (VALTREX) 500 MG tablet Take 2 tablets (1,000 mg total) by mouth daily. 10/06/22   Sharlene Dory, DO  VITAMIN K PO Take 1 tablet by mouth daily.    [provider]  zinc gluconate 50 MG tablet Take 50 mg by mouth daily.    [provider]      Allergies    Patient has no known allergies.    Review of Systems   Review of Systems  Constitutional:  Positive for appetite change and fatigue. Negative for fever.  HENT:  Positive for congestion, rhinorrhea and sinus pressure.   Respiratory:  Positive for cough. Negative for shortness of breath.   Cardiovascular:  Negative for chest pain, palpitations and leg swelling.  Gastrointestinal:  Negative for abdominal pain, diarrhea and vomiting.  Skin:  Negative for rash.  Neurological:  Negative for headaches.    Physical Exam Updated Vital Signs BP 117/70   Pulse 73   Temp 98 F (36.7 C) (Oral)   Resp (!) 26   Ht 5\' 1"  (1.549 m)   Wt 64.9 kg   SpO2 92%   BMI 27.02 kg/m  Physical Exam Vitals and nursing note reviewed.  Constitutional:      Appearance: Normal  appearance.  HENT:     Head: Normocephalic.     Nose: Congestion present.     Mouth/Throat:     Mouth: Mucous membranes are moist.  Cardiovascular:     Rate and Rhythm: Normal rate.  Pulmonary:     Effort: Pulmonary effort is normal. No respiratory distress.     Breath sounds: Rhonchi present.     Comments: 2 L nasal cannula, oxygenation in the low 90s, conversational dyspnea, tachypnea Abdominal:     Palpations: Abdomen is soft.     Tenderness: There is no abdominal tenderness.  Musculoskeletal:        General: No swelling.  Skin:    General: Skin is warm.  Neurological:     Mental Status: She is alert and  oriented to person, place, and time. Mental status is at baseline.  Psychiatric:        Mood and Affect: Mood normal.     ED Results / Procedures / Treatments   Labs (all labs ordered are listed, but only abnormal results are displayed) Labs Reviewed  RESP PANEL BY RT-PCR (RSV, FLU A&B, COVID)  [id number]  CBC WITH DIFFERENTIAL/PLATELET  COMPREHENSIVE METABOLIC PANEL  LACTIC ACID, PLASMA  LACTIC ACID, PLASMA    EKG EKG Interpretation Date/Time:  Friday June 29 2023 17:13:26 EST Ventricular Rate:  75 PR Interval:  156 QRS Duration:  100 QT Interval:  417 QTC Calculation: 466 R Axis:   -19  Text Interpretation: Sinus rhythm Borderline left axis deviation Confirmed by Coralee Pesa ([id number]) on 06/29/2023 5:31:36 PM  Radiology No results found.  Procedures .Critical Care  Performed by: Rozelle Logan, DO Authorized by: Rozelle Logan, DO   Critical care provider statement:    Critical care time (minutes):  30   Critical care time was exclusive of:  Separately billable procedures and treating other patients   Critical care was necessary to treat or prevent imminent or life-threatening deterioration of the following conditions:  Respiratory failure   Critical care was time spent personally by me on the following activities:  Development of treatment plan with patient or surrogate, discussions with consultants, evaluation of patient's response to treatment, examination of patient, ordering and review of laboratory studies, ordering and review of radiographic studies, ordering and performing treatments and interventions, pulse oximetry, re-evaluation of patient's condition and review of old charts   I assumed direction of critical care for this patient from another provider in my specialty: no     Care discussed with: admitting provider       Medications Ordered in ED Medications - No data to display  ED Course/ Medical Decision Making/ A&P                                  Medical Decision Making Amount and/or Complexity of Data Reviewed Labs: ordered. Radiology: ordered.  Risk Prescription drug management. Decision regarding hospitalization.   78 year old female presents emergency department concern for hypoxia.  Patient has been having nasal congestion and intermittently productive cough for the past couple weeks.  Was originally seen in urgent care and sent here for hypoxia.  Patient usually does not require oxygen, arrives on 4 L nasal cannula.  She is conversationally dyspneic but in no acute respiratory distress.  Blood work shows a mild leukocytosis.  Metabolic panel is reassuring, lactic is normal.  Respiratory panel is negative.  Chest x-ray shows nonspecific findings but I question  pneumonia.  Antibiotics for Ordered.  Patient continued to have worsening hypoxia, transition to high flow.  Given the degree of hypoxia without a focal finding on the chest x-ray CT PE study was ordered.  On my review there does not appear to be any large or central pulmonary embolus.  There are bilateral lung findings that looks infiltrative versus edema.  BNP and troponin added on.  Patient is doing well on high flow.  She will require admission for hypoxia that appears to be secondary to infection versus edema.  Patients evaluation and results requires admission for further treatment and care.  Spoke with hospitalist, reviewed patient's ED course and they accept admission.  Patient agrees with admission plan, offers no new complaints and is stable/unchanged at time of admit.        Final Clinical Impression(s) / ED Diagnoses Final diagnoses:  None    Rx / DC Orders ED Discharge Orders     None         Rozelle Logan, DO 06/29/23 2328

## 2023-06-29 NOTE — H&P (Signed)
 History and Physical      Carolyn Mckinney AVW:[id number] DOB: [birth year] DOA: 06/29/2023; DOS: 06/29/2023  PCP: Sharlene Dory, DO  Patient coming from: home   I have personally briefly reviewed patient's old medical records in [facility]  Chief Complaint: sob  HPI: Carolyn Mckinney is a 78 y.o. female with medical history significant for essential hypertension, depression, left breast cancer in 2001 status post lumpectomy/radiation, who is admitted to [facility] on 06/29/2023 with severe sepsis due to suspected community-acquired pneumonia after presenting from home to [facility] ED complaining of shortness of breath.   Patient reports that her symptoms started approximately 1 week ago, at which time she noted new onset rhinitis, rhinorrhea associated with cough during development of shortness of breath subjective fever.  Denies chills or full body rigors.  Reports that her shortness of breath is not been associated with orthopnea, PND, or worsening peripheral edema.  She also denies any associated chest pain, palpitations, diaphoresis, nor any hemoptysis.  She conveys that she is a lifelong non-smoker, and denies any known no known history of underlying COPD or asthma.  Denies any known baseline supplemental oxygen requirements.  Per chart review, no prior echocardiogram on file.  In the setting of the above symptoms, she presented to urgent care earlier today, at which time her initial oxygen saturations were noted to be in the 70s on room air, prompting her to be brought to [facility] emergency department for further evaluation management thereof.    ED Course:  Vital signs in the ED were notable for the following: Afebrile; heart rates in the 70s; systolic blood pressures in the 1 teens; respiratory rate 19-26, initial oxygen saturation 76% on room air, [place] improving into the mid 90s on salter high flow nasal cannula at 10 L/min.  Labs were notable for the following:  CMP was notable for the following: Sodium 133 compared to 140 in December 2023, bicarbonate 28, creatinine 0.73 compared to 0.76 in December 2023, glucose 100, liver enzymes within normal limits.  BNP 339, without any prior BNP data point available for point comparison.  High sensitive troponin I initially 26, with repeat value trending down to 22, without any prior high-sensitivity troponin I data points.  Lactic acid 1.6.  CBC notable for white blood cell count 13,700 with 73% neutrophils, hemoglobin 12.2.  COVID, influenza, RSV PCR were all negative.  Per my interpretation, EKG in ED demonstrated the following: Sinus rhythm with heart rate 75, normal intervals, no evidence of T wave or ST changes, including no evidence of ST elevation.  Imaging in the ED, per corresponding formal radiology read, was notable for the following: CTA chest with PE protocol showed no evidence of acute pulmonary embolism, will demonstrating bilateral airspace opacities and groundglass opacities suggestive of multifocal pneumonia, in the absence of any evidence of edema, effusion, or pneumothorax.  While in the ED, the following were administered: Azithromycin, Rocephin.  Prior to formal radiology read, the patient also received Lasix 20 mg IV x 1 dose.  Subsequently, the patient was admitted for further evaluation management of severe sepsis due to bacterial community-acquired pneumonia complicated by acute hypoxic respiratory distress.    Review of Systems: As per HPI otherwise 10 point review of systems negative.   Past Medical History:  Diagnosis Date   Allergy    Cancer [facility])    Breast   Cataract    cataract repair bilaterally   Closed patellar sleeve fracture of right knee 05/2019  Hypertension    Meniere's disease    Personal history of radiation therapy 2001   Left Breast Cancer    Past Surgical History:  Procedure Laterality Date   BREAST BIOPSY Left 2001   BREAST LUMPECTOMY Left 01/25/2000    COLONOSCOPY  2006   VAGINAL HYSTERECTOMY  1991   Done for bleeding. Benign pathology    Social History:  reports that she has never smoked. She has never used smokeless tobacco. She reports current alcohol use. She reports that she does not use drugs.   No Known Allergies  Family History  Problem Relation Age of Onset   Arthritis Mother    Heart disease Father    Colon cancer Neg Hx    Colon polyps Neg Hx    Esophageal cancer Neg Hx    Rectal cancer Neg Hx    Stomach cancer Neg Hx     Family history reviewed and not pertinent    Prior to Admission medications   Medication Sig Start Date End Date Taking? Authorizing Provider  amLODipine (NORVASC) 5 MG tablet Take 1 tablet (5 mg total) by mouth daily. 06/07/23  Yes Sharlene Dory, DO  Calcium Carb-Cholecalciferol (CALCIUM + D3 PO) Take 1 tablet by mouth daily.   Yes [provider]  DULoxetine (CYMBALTA) 60 MG capsule Take 1 capsule (60 mg total) by mouth 2 (two) times daily. 04/23/23  Yes Sharlene Dory, DO  losartan (COZAAR) 100 MG tablet Take 1 tablet (100 mg total) by mouth daily. 01/17/23  Yes Sharlene Dory, DO  meloxicam (MOBIC) 7.5 MG tablet Take 1 tablet by mouth once daily 06/19/23  Yes Draper, Marcial Pacas R, DO  Multiple Vitamin (MULTIVITAMIN ADULT PO) Take 1 tablet by mouth daily.   Yes [provider]  OXcarbazepine (TRILEPTAL) 150 MG tablet Take 1 tablet by mouth twice daily 01/10/23  Yes Wendling, Jilda Roche, DO  traZODone (DESYREL) 50 MG tablet Take 1-2 tablets (50-100 mg total) by mouth at bedtime as needed. for sleep 06/04/23  Yes Sharlene Dory, DO  valACYclovir (VALTREX) 500 MG tablet Take 2 tablets (1,000 mg total) by mouth daily. 10/06/22  Yes Sharlene Dory, DO  VITAMIN K PO Take 1 tablet by mouth daily.   Yes [provider]  zinc gluconate 50 MG tablet Take 50 mg by mouth daily.   Yes [provider]  Ferrous Sulfate (IRON PO) Take 1  tablet by mouth daily.    [provider]  solifenacin (VESICARE) 5 MG tablet Take 1 tablet (5 mg total) by mouth daily. Patient not taking: Reported on 06/29/2023 05/11/23   Sharlene Dory, DO     Objective    Physical Exam: Vitals:   06/29/23 1710 06/29/23 1801 06/29/23 1830 06/29/23 2045  BP:   117/70 118/63  Pulse:   73 77  Resp:   (!) 26 19  Temp:  98 F (36.7 C)  98.2 F (36.8 C)  TempSrc:  Oral  Oral  SpO2:   92% 90%  Weight: 64.9 kg     Height: 5\' 1"  (1.549 m)       General: appears to be stated age; alert, oriented; mildly increased work of breathing noted Skin: warm, dry, no rash Head:  AT/[place] Mouth:  Oral mucosa membranes appear moist, normal dentition Neck: supple; trachea midline Heart:  RRR; did not appreciate any M/R/G Lungs: CTAB, did not appreciate any wheezes, rales, or rhonchi Abdomen: + BS; soft, ND, NT Vascular: 2+ pedal pulses  b/l; 2+ radial pulses b/l Extremities: no peripheral edema, no muscle wasting Neuro: strength and sensation intact in upper and lower extremities b/l    Labs on Admission: I have personally reviewed following labs and imaging studies  CBC: Recent Labs  Lab 06/29/23 1734  WBC 13.7*  NEUTROABS 10.1*  HGB 12.2  HCT 37.1  MCV 101.4*  PLT 355   Basic Metabolic Panel: Recent Labs  Lab 06/29/23 1734  NA 133*  K 3.9  CL 97*  CO2 28  GLUCOSE 100*  BUN 15  CREATININE 0.73  CALCIUM 9.2   GFR: Estimated Creatinine Clearance: 50 mL/min (by C-G formula based on SCr of 0.73 mg/dL). Liver Function Tests: Recent Labs  Lab 06/29/23 1734  AST 20  ALT 13  ALKPHOS 66  BILITOT 1.0  PROT 6.2*  ALBUMIN 3.1*   No results for input(s): "LIPASE", "AMYLASE" in the last 168 hours. No results for input(s): "AMMONIA" in the last 168 hours. Coagulation Profile: No results for input(s): "INR", "PROTIME" in the last 168 hours. Cardiac Enzymes: No results for input(s): "CKTOTAL", "CKMB", "CKMBINDEX", "TROPONINI"  in the last 168 hours. BNP (last 3 results) No results for input(s): "PROBNP" in the last 8760 hours. HbA1C: No results for input(s): "HGBA1C" in the last 72 hours. CBG: No results for input(s): "GLUCAP" in the last 168 hours. Lipid Profile: No results for input(s): "CHOL", "HDL", "LDLCALC", "TRIG", "CHOLHDL", "LDLDIRECT" in the last 72 hours. Thyroid Function Tests: No results for input(s): "TSH", "T4TOTAL", "FREET4", "T3FREE", "THYROIDAB" in the last 72 hours. Anemia Panel: No results for input(s): "VITAMINB12", "FOLATE", "FERRITIN", "TIBC", "IRON", "RETICCTPCT" in the last 72 hours. Urine analysis:    Component Value Date/Time   COLORURINE YELLOW 09/07/2021 0055   APPEARANCEUR CLEAR 09/07/2021 0055   LABSPEC 1.015 09/07/2021 0055   PHURINE 7.0 09/07/2021 0055   GLUCOSEU NEGATIVE 09/07/2021 0055   HGBUR NEGATIVE 09/07/2021 0055   BILIRUBINUR NEGATIVE 09/07/2021 0055   KETONESUR 15 (A) 09/07/2021 0055   PROTEINUR NEGATIVE 09/07/2021 0055   NITRITE NEGATIVE 09/07/2021 0055   LEUKOCYTESUR TRACE (A) 09/07/2021 0055    Radiological Exams on Admission: DG Chest Portable 1 View Result Date: 06/29/2023 CLINICAL DATA:  hypoxia, cough EXAM: PORTABLE CHEST 1 VIEW COMPARISON:  09/07/2021 chest radiograph. FINDINGS: Thickening of the right paratracheal stripe with asymmetric prominence of the right hilum, increased. Stable mild cardiomegaly. No pneumothorax or pleural effusion. Streaky bibasilar lung opacities, similar to prior radiograph. IMPRESSION: 1. Thickening of the right paratracheal stripe with asymmetric prominence of the right hilum, increased. Chest CT with IV contrast recommended for further evaluation to exclude underlying adenopathy or mass. 2. Stable mild cardiomegaly. 3. Streaky bibasilar lung opacities, similar to prior radiograph, favor scarring or atelectasis. Electronically Signed   By: Delbert Phenix M.D.   On: 06/29/2023 21:27      Assessment/Plan    Principal  Problem:   CAP (community acquired pneumonia) Active Problems:   Essential hypertension   Acute hypoxic respiratory failure (HCC)   Severe sepsis (HCC)   Leukocytosis   Acute hyponatremia   Elevated troponin   Depression     #) Severe sepsis due to community-acquired pneumonia: Diagnosis on the basis of 1 week of progressive shortness of breath associate with onset cough, subjective fever, with CTA chest showing evidence of bilateral airspace opacities concerning for multifocal pneumonia.   SIRS criteria met via leukocytosis with neutrophilic predominance, tachypnea. Lactic acid level: Nonelevated at 1.6. Of note, given the associated presence of suspected end organ  damage in the form of concominant presenting acute Evoxac respiratory failure, criteria are met for pt's sepsis to be considered severe in nature. However, in the absence of lactic acid level that is greater than or equal to 4.0, and in the absence of any associated hypotension refractory to IVF's, there are no indications for administration of a 30 mL/kg IVF bolus at this time.   Additional ED work-up/management notable for: Initiation of azithromycin and Rocephin for community-acquired pneumonia.  Of note, blood cultures were collected prior to initiation of these antibiotics.  No e/o additional infectious process at this time, including COVID, influenza, RSV PCR which were all negative.  Will also check urinalysis.  There is suspicion that the patient's pneumonia is bacterial in nature, noting her leukocytosis with neutrophilic predominance.  Given that she initially was experiencing some rhinitis, right Verline Lema, there is the possibility of a preceding or associated viral process, as well as increased risk for secondary bacterial infection.  However, at this time, we will continue with azithromycin and Rocephin for empiric coverage of community-acquired pneumonia. will also check MRSA PCR.    Plan: CBC w/ diff and CMP in AM.  Abx:  Continue azithromycin, Rocephin, as above.  Add on procalcitonin level, Legionella urine antigen given the multi focal appearance of her pneumonia along with acute hyponatremia.  Check strep pneumoniae urine antigen.  Flutter valve, incentive spirometry.  Check MRSA PCR.  Check urinalysis.  Prn acetaminophen for fever.  Check magnesium and phosphorus levels.  Further evaluation management of associated acute hypoxic respiratory failure, as below.  of note, the sepsis order set was utilized in placement of admission orders for this patient.                       #) Acute hypoxic respiratory failure: in the context of acute respiratory symptoms and no known baseline supplemental O2 requirements, presenting O2 sat noted to be in the 70s on room air, slowly increasing into the mid 90s on salter high flow nasal cannula, as above. Appears to be on basis of community-acquired pneumonia with CTA chest showing evidence of bilateral airspace opacities as well as groundglass opacities suggestive of multifocal pna. No known chronic underlying pulmonary conditions.  While the groundglass appearance of these findings is suspected to be infectious in nature, will continue to closely monitor her from clinical standpoint with possibility of initiation of corticosteroids for potential inflammatory component if clinical worsening.  Suspect that mildly elevated troponin is on the basis of supply demand mismatch resulting from the decrease in oxygen delivery capacity that is a/w presenting acute hypoxic respiratory failure as opposed to representing a type I process due to acute plaque rupture, particularly given presenting ekg that shows no e/o acute ischemic changes, including no evidence of STEMI, and the absence of any reported chest pain a/w this presentation. Overall, ACS is felt to be less likely relative to type 2 supply demand mismatch, as above, but will closely monitor on telemetry overnight, and  pursue echocardiogram in the morning.   In terms of other considered etiologies, CTA chest showed no evidence of nor any evidence of pneumothorax.  COVID, influenza, RSV PCR were all negative.  She has a mild BNP elevation but without any prior data points available for comparison.  However, CTA chest shows no evidence of edema or effusion.  Clinically, presentation appears less suggestive of acutely decompensated heart failure, will closely monitor ensuing volume status as below, while pursuing echocardiogram in the  morning.  Plan: further evaluation/management of presenting severe sepsis due to community-acquired pneumonia, including IV antibiotics as above. monitor on telemetry. CMP/CBC in the AM. Check serum Mg and Phos levels. Check blood gas. Flutter valve, incentive spirometry.  As needed albuterol nebulizer.  Echocardiogram in the morning.  Add on procalcitonin level.                  #) Acute hypo-osmolar hyponatremia: Presenting serum sodium of 133, relative to most recent prior value of 140 in December 2023.  In the setting of her presenting community-acquired pneumonia with multifocal infiltrates, differential includes associated SIADH.  She does not appear to be overtly acutely volume overloaded mildly elevated BNP is noted, will pursue echocardiogram in the morning will closely monitor ensuing volume status as below.  Will also evaluate liver function with PTT, INR.  There is also possibility of pharmacologic contributions from outpatient medications, including scheduled use of Mobic as well as Cymbalta.  will refrain from provision of additional IVF's pending the result of random urine sodium and urine osmolality studies to provide additional insight into potential contributions from dehydration vs SIADH, as subsequent management would differ depending upon these results.   Plan: monitor strict I's and O's and daily weights.  check UA, random urine sodium, urine osmolality.   Check serum osmolality to confirm suspected hypoosmolar etiology.  Repeat CMP in the morning. Check TSH. Check serum uric acid level, as SIADH can be associated with hypouricemia due to hyperuricuria.  Check PTT, INR.  Check echocardiogram.  Hold home Cymbalta and for now.                     #) Essential Hypertension: documented h/o such, with outpatient antihypertensive regimen including losartan, Norvasc.  SBP's in the ED today:  1-teens  mmHg. however, in the setting of suspected severe sepsis, will hold home hypertensive medications for now.  Plan: Close monitoring of subsequent BP via routine VS. hold home Norvasc as well as losartan for now.                      #) Depression: documented h/o such. On Cymbalta as outpatient.  In the setting of acute hyponatremia, will hold home Cymbalta for now.  Plan: Hold home Cymbalta for now, as above.      DVT prophylaxis: SCD's   Code Status: Full code Family Communication: none Disposition Plan: Per Rounding Team Consults called: none;  Admission status: Inpatient    I SPENT GREATER THAN 75  MINUTES IN CLINICAL CARE TIME/MEDICAL DECISION-MAKING IN COMPLETING THIS ADMISSION.     Chaney Born Durand Wittmeyer DO [facility] From 7PM - 7AM   06/29/2023, 11:53 PM

## 2023-06-29 NOTE — ED Triage Notes (Addendum)
 Patient sent over by [facility] for low oxygen levels. She was 75% on room air upon arrival. She was on 4L/min upon arrival from ems at 95% Patient has had a cough for the last three weeks, with sinus congestion

## 2023-06-29 NOTE — Telephone Encounter (Signed)
  Chief Complaint: HA and pounding heart yesterday.  Symptoms: dizziness, sob,  Frequency: last evening lasted for about 5 hours Pertinent Negatives: Patient denies fever,  Disposition: [x] ED /[] Urgent Care (no appt availability in office) / [] Appointment(In office/virtual)/ []  [facility]/ [] Home Care/ [] Refused Recommended Disposition /[] [facility]/ []  Follow-up with PCP  Additional Notes: Pt states that she is having stuffy head and chest, denies fever. Pt states she has talked to her PCP about this. Pt states she has been told it was d/t allergies. The last 2 nights the pt has had difficulty sleeping. Pt states she has been hot/cold, HA, and eye pain. Last evening pt states she was very anxious and her heart started to pound very loudly. Pt states she is drinking a lot of water. Pt states that she has never had this heart problem before. Pt wears apple watch but it updated and it doesn't show numbers from last night. Pt states that she is SOB, dizzy, and nauseous. Pt denies heart hx. Pt was able to check her apple watch during the call and advised that it is telling her that she is not in afib, and that her heart rate is 85. Pt advised to go to ED, daughter is coming to take her.   Copied from CRM [id number]. Topic: Clinical - Red Word Triage >> Jun 29, 2023 12:15 PM [place] J wrote: [facility] that prompted transfer to Nurse Triage: Patient is reporting severe left eye pain and a very rapid and loud heart beat from last night that scared her. Patient is still having the eye pain but is concerned about heart rate. Reason for Disposition  New or worsened shortness of breath with activity (dyspnea on exertion)  Answer Assessment - Initial Assessment Questions 1. DESCRIPTION: "Please describe your heart rate or heartbeat that you are having" (e.g., fast/slow, regular/irregular, skipped or extra beats, "palpitations")     Palpitations last evening,  2. ONSET: "When did it start?"  (Minutes, hours or days)      Last evening, pt states she thinks it last about 5 hrs,  3. DURATION: "How long does it last" (e.g., seconds, minutes, hours)     5 hours 4. PATTERN "Does it come and go, or has it been constant since it started?"  "Does it get worse with exertion?"   "Are you feeling it now?"     Not feeling it now 6. HEART RATE: "Can you tell me your heart rate?" "How many beats in 15 seconds?"  (Note: not all patients can do this)       unsure 7. RECURRENT SYMPTOM: "Have you ever had this before?" If Yes, ask: "When was the last time?" and "What happened that time?"      never 8. CAUSE: "What do you think is causing the palpitations?"     unknown 9. CARDIAC HISTORY: "Do you have any history of heart disease?" (e.g., heart attack, angina, bypass surgery, angioplasty, arrhythmia)      denies 10. OTHER SYMPTOMS: "Do you have any other symptoms?" (e.g., dizziness, chest pain, sweating, difficulty breathing)       SOB, dizziness, nausea all currently  Protocols used: Heart Rate and Heartbeat Questions-A-AH

## 2023-06-30 ENCOUNTER — Encounter (HOSPITAL_COMMUNITY): Payer: Self-pay | Admitting: Internal Medicine

## 2023-06-30 ENCOUNTER — Other Ambulatory Visit: Payer: Self-pay

## 2023-06-30 ENCOUNTER — Inpatient Hospital Stay (HOSPITAL_COMMUNITY)

## 2023-06-30 DIAGNOSIS — E871 Hypo-osmolality and hyponatremia: Secondary | ICD-10-CM | POA: Diagnosis present

## 2023-06-30 DIAGNOSIS — R0902 Hypoxemia: Secondary | ICD-10-CM | POA: Diagnosis not present

## 2023-06-30 DIAGNOSIS — R0602 Shortness of breath: Secondary | ICD-10-CM | POA: Diagnosis not present

## 2023-06-30 DIAGNOSIS — R7989 Other specified abnormal findings of blood chemistry: Secondary | ICD-10-CM | POA: Diagnosis not present

## 2023-06-30 DIAGNOSIS — F32A Depression, unspecified: Secondary | ICD-10-CM | POA: Diagnosis present

## 2023-06-30 DIAGNOSIS — D72829 Elevated white blood cell count, unspecified: Secondary | ICD-10-CM | POA: Diagnosis present

## 2023-06-30 DIAGNOSIS — R6520 Severe sepsis without septic shock: Secondary | ICD-10-CM | POA: Diagnosis present

## 2023-06-30 DIAGNOSIS — A419 Sepsis, unspecified organism: Secondary | ICD-10-CM | POA: Diagnosis present

## 2023-06-30 LAB — CBC WITH DIFFERENTIAL/PLATELET
Abs Immature Granulocytes: 0.08 10*3/uL — ABNORMAL HIGH (ref 0.00–0.07)
Basophils Absolute: 0.1 10*3/uL (ref 0.0–0.1)
Basophils Relative: 1 %
Eosinophils Absolute: 0.3 10*3/uL (ref 0.0–0.5)
Eosinophils Relative: 1 %
HCT: 40.8 % (ref 36.0–46.0)
Hemoglobin: 13.5 g/dL (ref 12.0–15.0)
Immature Granulocytes: 0 %
Lymphocytes Relative: 8 %
Lymphs Abs: 1.4 10*3/uL (ref 0.7–4.0)
MCH: 33.8 pg (ref 26.0–34.0)
MCHC: 33.1 g/dL (ref 30.0–36.0)
MCV: 102.3 fL — ABNORMAL HIGH (ref 80.0–100.0)
Monocytes Absolute: 1.7 10*3/uL — ABNORMAL HIGH (ref 0.1–1.0)
Monocytes Relative: 9 %
Neutro Abs: 14.5 10*3/uL — ABNORMAL HIGH (ref 1.7–7.7)
Neutrophils Relative %: 81 %
Platelets: 373 10*3/uL (ref 150–400)
RBC: 3.99 MIL/uL (ref 3.87–5.11)
RDW: 12.6 % (ref 11.5–15.5)
WBC: 18 10*3/uL — ABNORMAL HIGH (ref 4.0–10.5)
nRBC: 0 % (ref 0.0–0.2)

## 2023-06-30 LAB — URINALYSIS, COMPLETE (UACMP) WITH MICROSCOPIC
Bacteria, UA: NONE SEEN
Bilirubin Urine: NEGATIVE
Glucose, UA: NEGATIVE mg/dL
Hgb urine dipstick: NEGATIVE
Ketones, ur: NEGATIVE mg/dL
Leukocytes,Ua: NEGATIVE
Nitrite: NEGATIVE
Protein, ur: NEGATIVE mg/dL
Specific Gravity, Urine: 1.014 (ref 1.005–1.030)
pH: 6 (ref 5.0–8.0)

## 2023-06-30 LAB — MAGNESIUM
Magnesium: 1.7 mg/dL (ref 1.7–2.4)
Magnesium: 1.9 mg/dL (ref 1.7–2.4)

## 2023-06-30 LAB — RESPIRATORY PANEL BY PCR

## 2023-06-30 LAB — CREATININE, URINE, RANDOM: Creatinine, Urine: 35 mg/dL

## 2023-06-30 LAB — ECHOCARDIOGRAM COMPLETE
Area-P 1/2: 3.42 cm2
Calc EF: 68.5 %
Height: 61 in
S' Lateral: 2.6 cm
Single Plane A2C EF: 71.5 %
Single Plane A4C EF: 63.5 %
Weight: 2271.62 [oz_av]

## 2023-06-30 LAB — BLOOD GAS, VENOUS
Acid-Base Excess: 3.8 mmol/L — ABNORMAL HIGH (ref 0.0–2.0)
Bicarbonate: 30.9 mmol/L — ABNORMAL HIGH (ref 20.0–28.0)
O2 Saturation: 36.4 %
Patient temperature: 37
pCO2, Ven: 56 mmHg (ref 44–60)
pH, Ven: 7.35 (ref 7.25–7.43)
pO2, Ven: <31 mmHg — CL (ref 32–45)

## 2023-06-30 LAB — COMPREHENSIVE METABOLIC PANEL
ALT: 15 U/L (ref 0–44)
AST: 25 U/L (ref 15–41)
Albumin: 3.4 g/dL — ABNORMAL LOW (ref 3.5–5.0)
Alkaline Phosphatase: 72 U/L (ref 38–126)
Anion gap: 11 (ref 5–15)
BUN: 13 mg/dL (ref 8–23)
CO2: 26 mmol/L (ref 22–32)
Calcium: 8.8 mg/dL — ABNORMAL LOW (ref 8.9–10.3)
Chloride: 98 mmol/L (ref 98–111)
Creatinine, Ser: 0.72 mg/dL (ref 0.44–1.00)
GFR, Estimated: >60 mL/min (ref >60)
Glucose, Bld: 119 mg/dL — ABNORMAL HIGH (ref 70–99)
Potassium: 3.9 mmol/L (ref 3.5–5.1)
Sodium: 135 mmol/L (ref 135–145)
Total Bilirubin: 0.8 mg/dL (ref 0.0–1.2)
Total Protein: 6.7 g/dL (ref 6.5–8.1)

## 2023-06-30 LAB — PHOSPHORUS: Phosphorus: 4.3 mg/dL (ref 2.5–4.6)

## 2023-06-30 LAB — APTT: aPTT: 28 s (ref 24–36)

## 2023-06-30 LAB — URIC ACID: Uric Acid, Serum: 4.3 mg/dL (ref 2.5–7.1)

## 2023-06-30 LAB — SODIUM, URINE, RANDOM: Sodium, Ur: 76 mmol/L

## 2023-06-30 LAB — OSMOLALITY: Osmolality: 288 mosm/kg (ref 275–295)

## 2023-06-30 LAB — PROCALCITONIN: Procalcitonin: <0.1 ng/mL

## 2023-06-30 LAB — PROTIME-INR
INR: 1 (ref 0.8–1.2)
Prothrombin Time: 13.8 s (ref 11.4–15.2)

## 2023-06-30 LAB — STREP PNEUMONIAE URINARY ANTIGEN: Strep Pneumo Urinary Antigen: NEGATIVE

## 2023-06-30 LAB — TSH: TSH: 2.926 u[IU]/mL (ref 0.350–4.500)

## 2023-06-30 LAB — OSMOLALITY, URINE: Osmolality, Ur: 329 mosm/kg (ref 300–900)

## 2023-06-30 LAB — MRSA NEXT GEN BY PCR, NASAL: MRSA by PCR Next Gen: NOT DETECTED

## 2023-06-30 LAB — LACTIC ACID, PLASMA: Lactic Acid, Venous: 1.5 mmol/L (ref 0.5–1.9)

## 2023-06-30 MED ORDER — SODIUM CHLORIDE 0.9 % IV SOLN
1.0000 g | INTRAVENOUS | Status: DC
  Administered 2023-06-30 – 2023-07-03 (×4): 1 g via INTRAVENOUS
  Filled 2023-06-30 (×5): qty 10

## 2023-06-30 MED ORDER — SODIUM CHLORIDE 0.9 % IV SOLN
500.0000 mg | INTRAVENOUS | Status: AC
  Administered 2023-06-30 – 2023-07-03 (×4): 500 mg via INTRAVENOUS
  Filled 2023-06-30 (×5): qty 5

## 2023-06-30 MED ORDER — ORAL CARE MOUTH RINSE: 15.0000 mL | OROMUCOSAL | Status: DC | PRN

## 2023-06-30 MED ORDER — ENOXAPARIN SODIUM 40 MG/0.4ML IJ SOSY
40.0000 mg | PREFILLED_SYRINGE | INTRAMUSCULAR | Status: DC
  Administered 2023-06-30 – 2023-07-08 (×9): 40 mg via SUBCUTANEOUS
  Filled 2023-06-30 (×9): qty 0.4

## 2023-06-30 MED ORDER — VALACYCLOVIR HCL 500 MG PO TABS
1000.0000 mg | ORAL_TABLET | Freq: Every day | ORAL | Status: DC
Start: 2023-06-30 — End: 2023-07-08
  Administered 2023-06-30 – 2023-07-08 (×9): 1000 mg via ORAL
  Filled 2023-06-30 (×9): qty 2

## 2023-06-30 MED ORDER — FUROSEMIDE 10 MG/ML IJ SOLN
40.0000 mg | Freq: Once | INTRAMUSCULAR | Status: AC
  Administered 2023-06-30: 40 mg via INTRAVENOUS
  Filled 2023-06-30: qty 4

## 2023-06-30 MED ORDER — FERROUS SULFATE 325 (65 FE) MG PO TABS
325.0000 mg | ORAL_TABLET | Freq: Every day | ORAL | Status: DC
  Administered 2023-06-30 – 2023-07-08 (×9): 325 mg via ORAL
  Filled 2023-06-30 (×10): qty 1

## 2023-06-30 MED ORDER — CHLORHEXIDINE GLUCONATE CLOTH 2 % EX PADS
6.0000 | MEDICATED_PAD | Freq: Every day | CUTANEOUS | Status: DC
Start: 2023-06-30 — End: 2023-07-08
  Administered 2023-06-30 – 2023-07-04 (×5): 6 via TOPICAL

## 2023-06-30 MED ORDER — ALBUTEROL SULFATE (2.5 MG/3ML) 0.083% IN NEBU: 2.5000 mg | INHALATION_SOLUTION | RESPIRATORY_TRACT | Status: DC | PRN

## 2023-06-30 MED ORDER — TRAZODONE HCL 50 MG PO TABS
50.0000 mg | ORAL_TABLET | Freq: Every evening | ORAL | Status: DC | PRN
  Administered 2023-06-30 – 2023-07-07 (×8): 100 mg via ORAL
  Filled 2023-06-30 (×4): qty 2
  Filled 2023-06-30: qty 1
  Filled 2023-06-30 (×4): qty 2

## 2023-06-30 MED ORDER — OXCARBAZEPINE 150 MG PO TABS
150.0000 mg | ORAL_TABLET | Freq: Two times a day (BID) | ORAL | Status: DC
  Administered 2023-06-30 – 2023-07-08 (×17): 150 mg via ORAL
  Filled 2023-06-30 (×19): qty 1

## 2023-06-30 NOTE — Progress Notes (Signed)
 PROGRESS NOTE    Carolyn Mckinney  UJW:[id number] DOB: 07-12-1945 DOA: 06/29/2023 PCP: Sharlene Dory, DO  Chief Complaint  Patient presents with   Shortness of Breath    Brief Narrative:   78 yo with hx HTN, depression, breast cancer and multiple other medical issues here with acute hypoxic respiratory failure due to community acquired pneumonia.   Assessment & Plan:   Principal Problem:   CAP (community acquired pneumonia) Active Problems:   Essential hypertension   Acute hypoxic respiratory failure (HCC)   Severe sepsis (HCC)   Leukocytosis   Acute hyponatremia   Elevated troponin   Depression  Acute Hypoxic Respiratory Failure Community Acquired Pneumonia Requiring 8 L HFNC this AM Met criteria for sepsis on presentation with leukocytosis, hypoxia and pneumonia CT chest without PE, but widespread bilateral heterogeneous consolidations and ground glass disease with partial lower lobe consolidations concerning for multifocal infectino.  Diffuse bilateral peribronchovacular thickening R>L.  Mild R hilar adenopathy and multiple mildly enlarged mediastinal LN's potentially reactive (needs CT follow up) Negative MRSA pcr Follow sputum cx, follow urine strep (negative) and urine legionella No blood cultures were collected Ceftriaxone/azithromycin Will keep net negative to net even, lasix as tolerated  Elevated BNP Follow echo Not clinically overloaded on exam Lasix to keep net negative to net even  Hypertension Hold losartan and amlodipine in setting of presentation with sepsis  Depression Cymbalta, trazodone  Paresthesias Trileptal, cymbalta   Left Breast Cancer s/p lumpectomy/radiation 2001     DVT prophylaxis: lovenox Code Status: full Family Communication: none at bedside Disposition:   Status is: Inpatient Remains inpatient appropriate because: need for ongoing inpatient care for acute hypoxic respiratory failure and community acquired  pneumonia   Consultants:  none  Procedures:  none  Antimicrobials:  Anti-infectives (From admission, onward)    Start     Dose/Rate Route Frequency Ordered Stop   06/30/23 1600  azithromycin (ZITHROMAX) 500 mg in sodium chloride 0.9 % 250 mL IVPB        500 mg 250 mL/hr over 60 Minutes Intravenous Every 24 hours 06/30/23 0038     06/30/23 1600  cefTRIAXone (ROCEPHIN) 1 g in sodium chloride 0.9 % 100 mL IVPB        1 g 200 mL/hr over 30 Minutes Intravenous Every 24 hours 06/30/23 0038     06/30/23 1000  valACYclovir (VALTREX) tablet 1,000 mg        1,000 mg Oral Daily 06/30/23 0241     06/29/23 2045  cefTRIAXone (ROCEPHIN) 1 g in sodium chloride 0.9 % 100 mL IVPB        1 g 200 mL/hr over 30 Minutes Intravenous  Once 06/29/23 2031 06/29/23 2134   06/29/23 2045  azithromycin (ZITHROMAX) 500 mg in sodium chloride 0.9 % 250 mL IVPB        500 mg 250 mL/hr over 60 Minutes Intravenous  Once 06/29/23 2031 06/29/23 2237       Subjective: Feeling better Slept ok Any movement makes her cough  Objective: Vitals:   06/30/23 0500 06/30/23 0600 06/30/23 0700 06/30/23 0800  BP: 130/73 135/73 126/74 128/74  Pulse: 80 79 80 80  Resp: (!) 32 (!) 35 (!) 41 (!) 38  Temp:    98.9 F (37.2 C)  TempSrc:    Oral  SpO2: 99% 100% 93% 100%  Weight:      Height:        Intake/Output Summary (Last 24 hours) at 06/30/2023 5621 Last data filed at  06/30/2023 0140 Gross per 24 hour  Intake 196.91 ml  Output --  Net 196.91 ml   Filed Weights   06/29/23 1710 06/30/23 0205  Weight: 64.9 kg 64.4 kg    Examination:  General exam: Appears calm and comfortable  Respiratory system: diffuse crackles, limited air movement Cardiovascular system: RRR Gastrointestinal system: Abdomen is nondistended, soft and nontender.  Central nervous system: Alert and oriented. No focal neurological deficits. Extremities: no LEE    Data Reviewed: I have personally reviewed following labs and imaging  studies  CBC: Recent Labs  Lab 06/29/23 1734 06/30/23 0140  WBC 13.7* 18.0*  NEUTROABS 10.1* 14.5*  HGB 12.2 13.5  HCT 37.1 40.8  MCV 101.4* 102.3*  PLT 355 373    Basic Metabolic Panel: Recent Labs  Lab 06/29/23 1734 06/30/23 0140  NA 133* 135  K 3.9 3.9  CL 97* 98  CO2 28 26  GLUCOSE 100* 119*  BUN 15 13  CREATININE 0.73 0.72  CALCIUM 9.2 8.8*  MG  --  1.9  1.7  PHOS  --  4.3    GFR: Estimated Creatinine Clearance: 49.8 mL/min (by C-G formula based on SCr of 0.72 mg/dL).  Liver Function Tests: Recent Labs  Lab 06/29/23 1734 06/30/23 0140  AST 20 25  ALT 13 15  ALKPHOS 66 72  BILITOT 1.0 0.8  PROT 6.2* 6.7  ALBUMIN 3.1* 3.4*    CBG: No results for input(s): "GLUCAP" in the last 168 hours.   Recent Results (from the past 240 hours)  Resp panel by RT-PCR (RSV, Flu Detrich Rakestraw&B, Covid) Anterior Nasal Swab     Status: None   Collection Time: 06/29/23  6:00 PM   Specimen: Anterior Nasal Swab  Result Value Ref Range Status   SARS Coronavirus 2 by RT PCR NEGATIVE NEGATIVE Final    Comment: (NOTE) SARS-CoV-2 target nucleic acids are NOT DETECTED.  The SARS-CoV-2 RNA is generally detectable in upper respiratory specimens during the acute phase of infection. The lowest concentration of SARS-CoV-2 viral copies this assay can detect is 138 copies/mL. Brentlee Delage negative result does not preclude SARS-Cov-2 infection and should not be used as the sole basis for treatment or other patient management decisions. Trinady Milewski negative result may occur with  improper specimen collection/handling, submission of specimen other than nasopharyngeal swab, presence of viral mutation(s) within the areas targeted by this assay, and inadequate number of viral copies(<138 copies/mL). Abdoulie Tierce negative result must be combined with clinical observations, patient history, and epidemiological information. The expected result is Negative.  Fact Sheet for Patients:   BloggerCourse.com  Fact Sheet for Healthcare Providers:  SeriousBroker.it  This test is no t yet approved or cleared by the [place] FDA and  has been authorized for detection and/or diagnosis of SARS-CoV-2 by FDA under an Emergency Use Authorization (EUA). This EUA will remain  in effect (meaning this test can be used) for the duration of the COVID-19 declaration under Section 564(b)(1) of the Act, 21 U.S.C.section 360bbb-3(b)(1), unless the authorization is terminated  or revoked sooner.       Influenza Demaryius Imran by PCR NEGATIVE NEGATIVE Final   Influenza B by PCR NEGATIVE NEGATIVE Final    Comment: (NOTE) The Xpert Xpress SARS-CoV-2/FLU/RSV plus assay is intended as an aid in the diagnosis of influenza from Nasopharyngeal swab specimens and should not be used as Keighley Deckman sole basis for treatment. Nasal washings and aspirates are unacceptable for Xpert Xpress SARS-CoV-2/FLU/RSV testing.  Fact Sheet for Patients: BloggerCourse.com  Fact Sheet for  Healthcare Providers: SeriousBroker.it  This test is not yet approved or cleared by the [place] and has been authorized for detection and/or diagnosis of SARS-CoV-2 by FDA under an Emergency Use Authorization (EUA). This EUA will remain in effect (meaning this test can be used) for the duration of the COVID-19 declaration under Section 564(b)(1) of the Act, 21 U.S.C. section 360bbb-3(b)(1), unless the authorization is terminated or revoked.     Resp Syncytial Virus by PCR NEGATIVE NEGATIVE Final    Comment: (NOTE) Fact Sheet for Patients: BloggerCourse.com  Fact Sheet for Healthcare Providers: SeriousBroker.it  This test is not yet approved or cleared by the [place] FDA and has been authorized for detection and/or diagnosis of SARS-CoV-2 by FDA under an Emergency Use  Authorization (EUA). This EUA will remain in effect (meaning this test can be used) for the duration of the COVID-19 declaration under Section 564(b)(1) of the Act, 21 U.S.C. section 360bbb-3(b)(1), unless the authorization is terminated or revoked.  Performed at [facility], [address], [place], [place] [address]   MRSA Next Gen by PCR, Nasal     Status: None   Collection Time: 06/30/23  1:40 AM   Specimen: Urine, Clean Catch; Nasal Swab  Result Value Ref Range Status   MRSA by PCR Next Gen NOT DETECTED NOT DETECTED Final    Comment: (NOTE) The GeneXpert MRSA Assay (FDA approved for NASAL specimens only), is one component of Skyelyn Scruggs comprehensive MRSA colonization surveillance program. It is not intended to diagnose MRSA infection nor to guide or monitor treatment for MRSA infections. Test performance is not FDA approved in patients less than 56 years old. Performed at [facility], [address], [place], [place] [address]          Radiology Studies: CT Angio Chest PE W/Cm &/Or Wo Cm Result Date: 06/30/2023 CLINICAL DATA:  Hypoxia EXAM: CT ANGIOGRAPHY CHEST WITH CONTRAST TECHNIQUE: Multidetector CT imaging of the chest was performed using the standard protocol during bolus administration of intravenous contrast. Multiplanar CT image reconstructions and MIPs were obtained to evaluate the vascular anatomy. RADIATION DOSE REDUCTION: This exam was performed according to the departmental dose-optimization program which includes automated exposure control, adjustment of the mA and/or kV according to patient size and/or use of iterative reconstruction technique. CONTRAST:  75mL OMNIPAQUE IOHEXOL 350 MG/ML SOLN COMPARISON:  Chest x-ray 06/29/2023, CT chest 09/07/2021 FINDINGS: Cardiovascular: Satisfactory opacification of the pulmonary arteries to the segmental level. No evidence of pulmonary embolism. Mild cardiomegaly. No sizable pericardial effusion.  Mild atherosclerosis. No aneurysm. Dilated azygous vein. Mediastinum/Nodes: Patent trachea. No thyroid mass. Multiple enlarged mediastinal lymph nodes. Prevascular lymph nodes measuring up to 9 mm. Right paratracheal nodes measuring up to 13 mm. Left paratracheal nodes measuring up to 14 mm. Subcarinal lymph nodes measuring up to 17 mm. Ill-defined right greater than left hilar/peribronchovascular soft tissue density without Kama Cammarano discrete mass. Esophagus unremarkable. Lungs/Pleura: Partial lower lobe consolidations. Widespread heterogeneous ground-glass densities and consolidations throughout the bilateral lungs. No significant pleural effusion. Prominent peribronchovascular soft tissue thickening. 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 No acute osseous abnormality. Probable Schmorl's node and mild superior endplate deformity at T12. Review of the MIP images confirms the above findings. IMPRESSION: 1. Negative for acute pulmonary embolus. 2. Widespread bilateral heterogeneous consolidations and ground-glass disease with partial lower lobe consolidations suspicious for multifocal infection. Diffuse bilateral peribronchovascular thickening right greater than left which may be inflammatory or infectious. Suspicion  of mild right hilar adenopathy and multiple mildly enlarged mediastinal lymph nodes potentially reactive but attention on CT follow-up. 3. 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 Cardiomegaly Aortic Atherosclerosis (ICD10-I70.0). Electronically Signed   By: Jasmine Pang M.D.   On: 06/30/2023 00:25   DG Chest Portable 1 View Result Date: 06/29/2023 CLINICAL DATA:  hypoxia, cough EXAM: PORTABLE CHEST 1 VIEW COMPARISON:  09/07/2021 chest radiograph. FINDINGS: Thickening of the right paratracheal stripe with asymmetric prominence of the right hilum, increased. Stable mild cardiomegaly. No pneumothorax or pleural  effusion. Streaky bibasilar lung opacities, similar to prior radiograph. IMPRESSION: 1. Thickening of the right paratracheal stripe with asymmetric prominence of the right hilum, increased. Chest CT with IV contrast recommended for further evaluation to exclude underlying adenopathy or mass. 2. Stable mild cardiomegaly. 3. Streaky bibasilar lung opacities, similar to prior radiograph, favor scarring or atelectasis. Electronically Signed   By: Delbert Phenix M.D.   On: 06/29/2023 21:27        Scheduled Meds:  Chlorhexidine Gluconate Cloth  6 each Topical Daily   ferrous sulfate  325 mg Oral Daily   OXcarbazepine  150 mg Oral BID   valACYclovir  1,000 mg Oral Daily   Continuous Infusions:  azithromycin     cefTRIAXone (ROCEPHIN)  IV       LOS: 1 day    Time spent: over 30 min    Lacretia Nicks, MD [facility]   To contact the attending provider between 7A-7P or the covering provider during after hours 7P-7A, please log into the web site [email or web address] and access using universal [place] password for that web site. If you do not have the password, please call the hospital operator.  06/30/2023, 8:36 AM

## 2023-06-30 NOTE — Progress Notes (Signed)
  Echocardiogram 2D Echocardiogram has been performed.  Carolyn Mckinney 06/30/2023, 1:10 PM

## 2023-06-30 NOTE — Plan of Care (Signed)
  Problem: Fluid Volume: Goal: Hemodynamic stability will improve Outcome: Progressing   Problem: Clinical Measurements: Goal: Diagnostic test results will improve Outcome: Progressing Goal: Signs and symptoms of infection will decrease Outcome: Progressing   Problem: Respiratory: Goal: Ability to maintain adequate ventilation will improve Outcome: Progressing   Problem: Health Behavior/Discharge Planning: Goal: Ability to manage health-related needs will improve Outcome: Progressing   Problem: Clinical Measurements: Goal: Ability to maintain clinical measurements within normal limits will improve Outcome: Progressing Goal: Diagnostic test results will improve Outcome: Progressing Goal: Respiratory complications will improve Outcome: Progressing Goal: Cardiovascular complication will be avoided Outcome: Progressing   Problem: Activity: Goal: Risk for activity intolerance will decrease Outcome: Progressing

## 2023-06-30 NOTE — Progress Notes (Signed)
2D echo attempted, patient care in room. Will try later

## 2023-06-30 NOTE — Plan of Care (Signed)
  Problem: Fluid Volume: Goal: Hemodynamic stability will improve Outcome: Progressing   Problem: Respiratory: Goal: Ability to maintain adequate ventilation will improve Outcome: Progressing   Problem: Clinical Measurements: Goal: Respiratory complications will improve Outcome: Progressing Goal: Cardiovascular complication will be avoided Outcome: Progressing   Problem: Activity: Goal: Risk for activity intolerance will decrease Outcome: Progressing   Problem: Nutrition: Goal: Adequate nutrition will be maintained Outcome: Progressing   Problem: Coping: Goal: Level of anxiety will decrease Outcome: Progressing   Problem: Elimination: Goal: Will not experience complications related to urinary retention Outcome: Progressing   Problem: Pain Managment: Goal: General experience of comfort will improve and/or be controlled Outcome: Progressing   Problem: Skin Integrity: Goal: Risk for impaired skin integrity will decrease Outcome: Progressing

## 2023-07-01 ENCOUNTER — Inpatient Hospital Stay (HOSPITAL_COMMUNITY)

## 2023-07-01 DIAGNOSIS — R0902 Hypoxemia: Secondary | ICD-10-CM | POA: Diagnosis not present

## 2023-07-01 LAB — CBC WITH DIFFERENTIAL/PLATELET
Abs Immature Granulocytes: 0.09 10*3/uL — ABNORMAL HIGH (ref 0.00–0.07)
Basophils Absolute: 0 10*3/uL (ref 0.0–0.1)
Basophils Relative: 0 %
Eosinophils Absolute: 0.1 10*3/uL (ref 0.0–0.5)
Eosinophils Relative: 1 %
HCT: 34.1 % — ABNORMAL LOW (ref 36.0–46.0)
Hemoglobin: 11.4 g/dL — ABNORMAL LOW (ref 12.0–15.0)
Immature Granulocytes: 1 %
Lymphocytes Relative: 10 %
Lymphs Abs: 1.3 10*3/uL (ref 0.7–4.0)
MCH: 33.2 pg (ref 26.0–34.0)
MCHC: 33.4 g/dL (ref 30.0–36.0)
MCV: 99.4 fL (ref 80.0–100.0)
Monocytes Absolute: 1.7 10*3/uL — ABNORMAL HIGH (ref 0.1–1.0)
Monocytes Relative: 13 %
Neutro Abs: 10.1 10*3/uL — ABNORMAL HIGH (ref 1.7–7.7)
Neutrophils Relative %: 75 %
Platelets: 328 10*3/uL (ref 150–400)
RBC: 3.43 MIL/uL — ABNORMAL LOW (ref 3.87–5.11)
RDW: 12.3 % (ref 11.5–15.5)
WBC: 13.4 10*3/uL — ABNORMAL HIGH (ref 4.0–10.5)
nRBC: 0 % (ref 0.0–0.2)

## 2023-07-01 LAB — EXPECTORATED SPUTUM ASSESSMENT W GRAM STAIN, RFLX TO RESP C

## 2023-07-01 LAB — COMPREHENSIVE METABOLIC PANEL
ALT: 13 U/L (ref 0–44)
AST: 21 U/L (ref 15–41)
Albumin: 2.6 g/dL — ABNORMAL LOW (ref 3.5–5.0)
Alkaline Phosphatase: 61 U/L (ref 38–126)
Anion gap: 9 (ref 5–15)
BUN: 13 mg/dL (ref 8–23)
CO2: 28 mmol/L (ref 22–32)
Calcium: 8.2 mg/dL — ABNORMAL LOW (ref 8.9–10.3)
Chloride: 95 mmol/L — ABNORMAL LOW (ref 98–111)
Creatinine, Ser: 0.56 mg/dL (ref 0.44–1.00)
GFR, Estimated: >60 mL/min (ref >60)
Glucose, Bld: 126 mg/dL — ABNORMAL HIGH (ref 70–99)
Potassium: 3.3 mmol/L — ABNORMAL LOW (ref 3.5–5.1)
Sodium: 132 mmol/L — ABNORMAL LOW (ref 135–145)
Total Bilirubin: 0.8 mg/dL (ref 0.0–1.2)
Total Protein: 5.5 g/dL — ABNORMAL LOW (ref 6.5–8.1)

## 2023-07-01 LAB — PHOSPHORUS: Phosphorus: 3.2 mg/dL (ref 2.5–4.6)

## 2023-07-01 LAB — MAGNESIUM: Magnesium: 1.8 mg/dL (ref 1.7–2.4)

## 2023-07-01 LAB — LEGIONELLA PNEUMOPHILA SEROGP 1 UR AG: L. pneumophila Serogp 1 Ur Ag: NEGATIVE

## 2023-07-01 LAB — BRAIN NATRIURETIC PEPTIDE: B Natriuretic Peptide: 274.6 pg/mL — ABNORMAL HIGH (ref 0.0–100.0)

## 2023-07-01 LAB — GLUCOSE, CAPILLARY: Glucose-Capillary: 143 mg/dL — ABNORMAL HIGH (ref 70–99)

## 2023-07-01 MED ORDER — FUROSEMIDE 10 MG/ML IJ SOLN
40.0000 mg | Freq: Once | INTRAMUSCULAR | Status: AC
  Administered 2023-07-01: 40 mg via INTRAVENOUS
  Filled 2023-07-01: qty 4

## 2023-07-01 MED ORDER — DOCUSATE SODIUM 50 MG PO CAPS
50.0000 mg | ORAL_CAPSULE | Freq: Once | ORAL | Status: AC | PRN
  Administered 2023-07-01 – 2023-07-02 (×2): 50 mg via ORAL
  Filled 2023-07-01 (×4): qty 1

## 2023-07-01 MED ORDER — POTASSIUM CHLORIDE CRYS ER 20 MEQ PO TBCR
40.0000 meq | EXTENDED_RELEASE_TABLET | ORAL | Status: AC
  Administered 2023-07-01 (×2): 40 meq via ORAL
  Filled 2023-07-01 (×2): qty 2

## 2023-07-01 NOTE — Plan of Care (Signed)
  Problem: Fluid Volume: Goal: Hemodynamic stability will improve Outcome: Progressing   Problem: Clinical Measurements: Goal: Diagnostic test results will improve Outcome: Progressing Goal: Signs and symptoms of infection will decrease Outcome: Progressing   Problem: Respiratory: Goal: Ability to maintain adequate ventilation will improve Outcome: Progressing   Problem: Education: Goal: Knowledge of General Education information will improve Description: Including pain rating scale, medication(s)/side effects and non-pharmacologic comfort measures Outcome: Progressing   Problem: Health Behavior/Discharge Planning: Goal: Ability to manage health-related needs will improve Outcome: Progressing   Problem: Clinical Measurements: Goal: Ability to maintain clinical measurements within normal limits will improve Outcome: Progressing Goal: Will remain free from infection Outcome: Progressing Goal: Diagnostic test results will improve Outcome: Progressing Goal: Respiratory complications will improve Outcome: Progressing Goal: Cardiovascular complication will be avoided Outcome: Progressing   Problem: Activity: Goal: Risk for activity intolerance will decrease Outcome: Progressing   Problem: Nutrition: Goal: Adequate nutrition will be maintained Outcome: Progressing   Problem: Coping: Goal: Level of anxiety will decrease Outcome: Progressing   Problem: Elimination: Goal: Will not experience complications related to bowel motility Outcome: Progressing Goal: Will not experience complications related to urinary retention Outcome: Progressing   Problem: Pain Managment: Goal: General experience of comfort will improve and/or be controlled Outcome: Progressing   Problem: Safety: Goal: Ability to remain free from injury will improve Outcome: Progressing   Problem: Skin Integrity: Goal: Risk for impaired skin integrity will decrease Outcome: Progressing   Cindy S. Clelia Croft  BSN, RN, CCRP, CCRN 07/01/2023 5:04 AM

## 2023-07-01 NOTE — Progress Notes (Signed)
 PT Cancellation Note  Patient Details Name: Carolyn Mckinney MRN: [id number] DOB: 22-Mar-1946   Cancelled Treatment:    Reason Eval/Treat Not Completed: Patient not medically ready. Will hold PT today and check back on tomorrow.    Faye Ramsay, PT Acute Rehabilitation  Office: [phone]

## 2023-07-01 NOTE — Plan of Care (Signed)
 Remained on 10L HFNC for most of the day, then titrated down to 8L and maintaining sats in 90's.  Rapidly decompensates with exertion.  Sputum sample sent for culture.  No other significant changes or events today.

## 2023-07-01 NOTE — Progress Notes (Addendum)
 PROGRESS NOTE    Carolyn Mckinney  ZOX:[id number] DOB: 1946-04-22 DOA: 06/29/2023 PCP: Sharlene Dory, DO  Chief Complaint  Patient presents with   Shortness of Breath    Brief Narrative:   78 yo with hx HTN, depression, breast cancer and multiple other medical issues here with acute hypoxic respiratory failure due to community acquired pneumonia.   Assessment & Plan:   Principal Problem:   CAP (community acquired pneumonia) Active Problems:   Essential hypertension   Acute hypoxic respiratory failure (HCC)   Severe sepsis (HCC)   Leukocytosis   Acute hyponatremia   Elevated troponin   Depression   Hypoxia  Acute Hypoxic Respiratory Failure Community Acquired Pneumonia Requiring 10 L HFNC this AM Met criteria for sepsis on presentation with leukocytosis, hypoxia and pneumonia CT chest without PE, but widespread bilateral heterogeneous consolidations and ground glass disease with partial lower lobe consolidations concerning for multifocal infectino.  Diffuse bilateral peribronchovacular thickening R>L.  Mild R hilar adenopathy and multiple mildly enlarged mediastinal LN's potentially reactive (needs CT follow up) Repeat CXR 3/9 Negative MRSA pcr Follow sputum cx, follow urine strep (negative) and urine legionella (pending) No blood cultures were collected Ceftriaxone/azithromycin (3/7-present) Will keep net negative to net even, lasix as tolerated  Elevated BNP Follow echo -> EF 55-60%, no RWMA, mild LVH, moderately elevated PASP.  Abnormal septal motion (not clear significance).  Will discuss with cardiology during the week to see if any follow up recommended.  Not clinically overloaded on exam Lasix to keep net negative to net even  Hypertension Hold losartan and amlodipine in setting of presentation with sepsis  Depression Cymbalta, trazodone  Paresthesias Trileptal, cymbalta   Left Breast Cancer s/p lumpectomy/radiation 2001     DVT prophylaxis:  lovenox Code Status: full Family Communication: none at bedside - discussed with daughter, Heywood Iles, over the phone Disposition:   Status is: Inpatient Remains inpatient appropriate because: need for ongoing inpatient care for acute hypoxic respiratory failure and community acquired pneumonia   Consultants:  none  Procedures:  none  Antimicrobials:  Anti-infectives (From admission, onward)    Start     Dose/Rate Route Frequency Ordered Stop   06/30/23 1600  azithromycin (ZITHROMAX) 500 mg in sodium chloride 0.9 % 250 mL IVPB        500 mg 250 mL/hr over 60 Minutes Intravenous Every 24 hours 06/30/23 0038     06/30/23 1600  cefTRIAXone (ROCEPHIN) 1 g in sodium chloride 0.9 % 100 mL IVPB        1 g 200 mL/hr over 30 Minutes Intravenous Every 24 hours 06/30/23 0038     06/30/23 1000  valACYclovir (VALTREX) tablet 1,000 mg        1,000 mg Oral Daily 06/30/23 0241     06/29/23 2045  cefTRIAXone (ROCEPHIN) 1 g in sodium chloride 0.9 % 100 mL IVPB        1 g 200 mL/hr over 30 Minutes Intravenous  Once 06/29/23 2031 06/29/23 2134   06/29/23 2045  azithromycin (ZITHROMAX) 500 mg in sodium chloride 0.9 % 250 mL IVPB        500 mg 250 mL/hr over 60 Minutes Intravenous  Once 06/29/23 2031 06/29/23 2237       Subjective: Says she feels better  Objective: Vitals:   07/01/23 0600 07/01/23 0658 07/01/23 0701 07/01/23 0730  BP: 124/63  124/64   Pulse: 81 80 80   Resp: (!) 34 (!) 35 (!) 32   Temp:  99.5 F (37.5 C)  TempSrc:    Axillary  SpO2: 92% 90% 97%   Weight:      Height:        Intake/Output Summary (Last 24 hours) at 07/01/2023 0839 Last data filed at 06/30/2023 2245 Gross per 24 hour  Intake 790.03 ml  Output --  Net 790.03 ml   Filed Weights   06/29/23 1710 06/30/23 0205 07/01/23 0500  Weight: 64.9 kg 64.4 kg 64.6 kg    Examination:  General: No acute distress, tired appearing. Cardiovascular: RRR Lungs: diffuse crackles, unlabored on 10 L [place] Abdomen:  Soft, nontender, nondistended Neurological: Alert, mildly confused this morning - seems tired. Moves all extremities 4 with equal strength. Cranial nerves II through XII grossly intact. Extremities: No clubbing or cyanosis. No edema.     Data Reviewed: I have personally reviewed following labs and imaging studies  CBC: Recent Labs  Lab 06/29/23 1734 06/30/23 0140 07/01/23 0332  WBC 13.7* 18.0* 13.4*  NEUTROABS 10.1* 14.5* 10.1*  HGB 12.2 13.5 11.4*  HCT 37.1 40.8 34.1*  MCV 101.4* 102.3* 99.4  PLT 355 373 328    Basic Metabolic Panel: Recent Labs  Lab 06/29/23 1734 06/30/23 0140 07/01/23 0332  NA 133* 135 132*  K 3.9 3.9 3.3*  CL 97* 98 95*  CO2 28 26 28   GLUCOSE 100* 119* 126*  BUN 15 13 13   CREATININE 0.73 0.72 0.56  CALCIUM 9.2 8.8* 8.2*  MG  --  1.9  1.7 1.8  PHOS  --  4.3 3.2    GFR: Estimated Creatinine Clearance: 49.9 mL/min (by C-G formula based on SCr of 0.56 mg/dL).  Liver Function Tests: Recent Labs  Lab 06/29/23 1734 06/30/23 0140 07/01/23 0332  AST 20 25 21   ALT 13 15 13   ALKPHOS 66 72 61  BILITOT 1.0 0.8 0.8  PROT 6.2* 6.7 5.5*  ALBUMIN 3.1* 3.4* 2.6*    CBG: No results for input(s): "GLUCAP" in the last 168 hours.   Recent Results (from the past 240 hours)  Resp panel by RT-PCR (RSV, Flu Akeela Busk&B, Covid) Anterior Nasal Swab     Status: None   Collection Time: 06/29/23  6:00 PM   Specimen: Anterior Nasal Swab  Result Value Ref Range Status   SARS Coronavirus 2 by RT PCR NEGATIVE NEGATIVE Final    Comment: (NOTE) SARS-CoV-2 target nucleic acids are NOT DETECTED.  The SARS-CoV-2 RNA is generally detectable in upper respiratory specimens during the acute phase of infection. The lowest concentration of SARS-CoV-2 viral copies this assay can detect is 138 copies/mL. Jewelle Whitner negative result does not preclude SARS-Cov-2 infection and should not be used as the sole basis for treatment or other patient management decisions. Drako Maese negative result may  occur with  improper specimen collection/handling, submission of specimen other than nasopharyngeal swab, presence of viral mutation(s) within the areas targeted by this assay, and inadequate number of viral copies(<138 copies/mL). Rodrick Payson negative result must be combined with clinical observations, patient history, and epidemiological information. The expected result is Negative.  Fact Sheet for Patients:  BloggerCourse.com  Fact Sheet for Healthcare Providers:  SeriousBroker.it  This test is no t yet approved or cleared by the [place] FDA and  has been authorized for detection and/or diagnosis of SARS-CoV-2 by FDA under an Emergency Use Authorization (EUA). This EUA will remain  in effect (meaning this test can be used) for the duration of the COVID-19 declaration under Section 564(b)(1) of the Act, 21 U.S.C.section 360bbb-3(b)(1), unless  the authorization is terminated  or revoked sooner.       Influenza Avantika Shere by PCR NEGATIVE NEGATIVE Final   Influenza B by PCR NEGATIVE NEGATIVE Final    Comment: (NOTE) The Xpert Xpress SARS-CoV-2/FLU/RSV plus assay is intended as an aid in the diagnosis of influenza from Nasopharyngeal swab specimens and should not be used as Kai Railsback sole basis for treatment. Nasal washings and aspirates are unacceptable for Xpert Xpress SARS-CoV-2/FLU/RSV testing.  Fact Sheet for Patients: BloggerCourse.com  Fact Sheet for Healthcare Providers: SeriousBroker.it  This test is not yet approved or cleared by the [place] FDA and has been authorized for detection and/or diagnosis of SARS-CoV-2 by FDA under an Emergency Use Authorization (EUA). This EUA will remain in effect (meaning this test can be used) for the duration of the COVID-19 declaration under Section 564(b)(1) of the Act, 21 U.S.C. section 360bbb-3(b)(1), unless the authorization is terminated  or revoked.     Resp Syncytial Virus by PCR NEGATIVE NEGATIVE Final    Comment: (NOTE) Fact Sheet for Patients: BloggerCourse.com  Fact Sheet for Healthcare Providers: SeriousBroker.it  This test is not yet approved or cleared by the [place] FDA and has been authorized for detection and/or diagnosis of SARS-CoV-2 by FDA under an Emergency Use Authorization (EUA). This EUA will remain in effect (meaning this test can be used) for the duration of the COVID-19 declaration under Section 564(b)(1) of the Act, 21 U.S.C. section 360bbb-3(b)(1), unless the authorization is terminated or revoked.  Performed at [facility], [address] [place], [place] [address]   MRSA Next Gen by PCR, Nasal     Status: None   Collection Time: 06/30/23  1:40 AM   Specimen: Urine, Clean Catch; Nasal Swab  Result Value Ref Range Status   MRSA by PCR Next Gen NOT DETECTED NOT DETECTED Final    Comment: (NOTE) The GeneXpert MRSA Assay (FDA approved for NASAL specimens only), is one component of Chandra Asher comprehensive MRSA colonization surveillance program. It is not intended to diagnose MRSA infection nor to guide or monitor treatment for MRSA infections. Test performance is not FDA approved in patients less than 25 years old. Performed at [facility], [address] [place], [place] [address]   Respiratory (~20 pathogens) panel by PCR     Status: None   Collection Time: 06/30/23  9:00 AM   Specimen: Nasopharyngeal Swab; Respiratory  Result Value Ref Range Status   Adenovirus NOT DETECTED NOT DETECTED Final   Coronavirus 229E NOT DETECTED NOT DETECTED Final    Comment: (NOTE) The Coronavirus on the Respiratory Panel, DOES NOT test for the novel  Coronavirus (2019 nCoV)    Coronavirus HKU1 NOT DETECTED NOT DETECTED Final   Coronavirus NL63 NOT DETECTED NOT DETECTED Final   Coronavirus OC43 NOT DETECTED NOT  DETECTED Final   Metapneumovirus NOT DETECTED NOT DETECTED Final   Rhinovirus / Enterovirus NOT DETECTED NOT DETECTED Final   Influenza [place] NOT DETECTED NOT DETECTED Final   Influenza B NOT DETECTED NOT DETECTED Final   Parainfluenza Virus 1 NOT DETECTED NOT DETECTED Final   Parainfluenza Virus 2 NOT DETECTED NOT DETECTED Final   Parainfluenza Virus 3 NOT DETECTED NOT DETECTED Final   Parainfluenza Virus 4 NOT DETECTED NOT DETECTED Final   Respiratory Syncytial Virus NOT DETECTED NOT DETECTED Final   Bordetella pertussis NOT DETECTED NOT DETECTED Final   Bordetella Parapertussis NOT DETECTED NOT DETECTED Final   Chlamydophila pneumoniae NOT DETECTED NOT DETECTED Final  Mycoplasma pneumoniae NOT DETECTED NOT DETECTED Final    Comment: Performed at [facility], [address] [place], [place] [address]         Radiology Studies: ECHOCARDIOGRAM COMPLETE Result Date: 06/30/2023    ECHOCARDIOGRAM REPORT   Patient Name:   MEGAN PRESTI Date of Exam: 06/30/2023 Medical Rec #:  [id number]      Height:       61.0 in Accession #:    [id number]     Weight:       142.0 lb Date of Birth:  02/16/1946     BSA:          1.633 m Patient Age:    82 years       BP:           126/74 mmHg Patient Gender: F              HR:           76 bpm. Exam Location:  Inpatient Procedure: 2D Echo, Cardiac Doppler and Color Doppler (Both Spectral and Color            Flow Doppler were utilized during procedure). Indications:    R06.02 SOB. Elevated troponin.  History:        Patient has no prior history of Echocardiogram examinations.                 Signs/Symptoms:Bacteremia, Shortness of Breath, Chest Pain and                 Dyspnea; Risk Factors:Hypertension. Breast cancer. Pneumonia.  Sonographer:    Sheralyn Boatman RDCS Referring Phys: [id number] Angie Fava  Sonographer Comments: Suboptimal apical window. IMPRESSIONS  1. Abnormal septal motion. Left ventricular ejection fraction, by estimation, is 55 to 60%. The left  ventricle has normal function. The left ventricle has no regional wall motion abnormalities. There is mild left ventricular hypertrophy. Left ventricular diastolic parameters were normal.  2. Right ventricular systolic function is normal. The right ventricular size is normal. There is moderately elevated pulmonary artery systolic pressure.  3. The mitral valve is normal in structure. Trivial mitral valve regurgitation. No evidence of mitral stenosis.  4. The aortic valve is tricuspid. Aortic valve regurgitation is not visualized. No aortic stenosis is present.  5. The inferior vena cava is normal in size with greater than 50% respiratory variability, suggesting right atrial pressure of 3 mmHg. FINDINGS  Left Ventricle: Abnormal septal motion. Left ventricular ejection fraction, by estimation, is 55 to 60%. The left ventricle has normal function. The left ventricle has no regional wall motion abnormalities. Strain was performed and the global longitudinal strain is indeterminate. The left ventricular internal cavity size was normal in size. There is mild left ventricular hypertrophy. Left ventricular diastolic parameters were normal. Right Ventricle: The right ventricular size is normal. No increase in right ventricular wall thickness. Right ventricular systolic function is normal. There is moderately elevated pulmonary artery systolic pressure. The tricuspid regurgitant velocity is 2.90 m/s, and with an assumed right atrial pressure of 15 mmHg, the estimated right ventricular systolic pressure is 48.6 mmHg. Left Atrium: Left atrial size was normal in size. Right Atrium: Right atrial size was normal in size. Pericardium: There is no evidence of pericardial effusion. Mitral Valve: The mitral valve is normal in structure. Trivial mitral valve regurgitation. No evidence of mitral valve stenosis. Tricuspid Valve: The tricuspid valve is normal in structure. Tricuspid valve regurgitation is mild . No evidence of  tricuspid  stenosis. Aortic Valve: The aortic valve is tricuspid. Aortic valve regurgitation is not visualized. No aortic stenosis is present. Pulmonic Valve: The pulmonic valve was normal in structure. Pulmonic valve regurgitation is not visualized. No evidence of pulmonic stenosis. Aorta: The aortic root is normal in size and structure. Venous: The inferior vena cava is normal in size with greater than 50% respiratory variability, suggesting right atrial pressure of 3 mmHg. IAS/Shunts: No atrial level shunt detected by color flow Doppler. Additional Comments: 3D was performed not requiring image post processing on an independent workstation and was indeterminate.  LEFT VENTRICLE PLAX 2D LVIDd:         4.00 cm     Diastology LVIDs:         2.60 cm     LV e' medial:    7.40 cm/s LV PW:         1.20 cm     LV E/e' medial:  8.5 LV IVS:        1.10 cm     LV e' lateral:   10.30 cm/s LVOT diam:     2.20 cm     LV E/e' lateral: 6.1 LV SV:         105 LV SV Index:   64 LVOT Area:     3.80 cm  LV Volumes (MOD) LV vol d, MOD A2C: 40.9 ml LV vol d, MOD A4C: 46.0 ml LV vol s, MOD A2C: 11.7 ml LV vol s, MOD A4C: 16.8 ml LV SV MOD A2C:     29.2 ml LV SV MOD A4C:     46.0 ml LV SV MOD BP:      30.3 ml RIGHT VENTRICLE             IVC RV S prime:     18.00 cm/s  IVC diam: 1.00 cm TAPSE (M-mode): 2.1 cm LEFT ATRIUM             Index        RIGHT ATRIUM           Index LA diam:        2.10 cm 1.29 cm/m   RA Area:     12.80 cm LA Vol (A2C):   32.0 ml 19.60 ml/m  RA Volume:   30.80 ml  18.86 ml/m LA Vol (A4C):   22.7 ml 13.90 ml/m LA Biplane Vol: 26.7 ml 16.35 ml/m  AORTIC VALVE             PULMONIC VALVE LVOT Vmax:   143.00 cm/s PR End Diast Vel: 1.67 msec LVOT Vmean:  95.800 cm/s LVOT VTI:    0.275 m  AORTA Ao Root diam: 2.90 cm Ao Asc diam:  3.60 cm MITRAL VALVE               TRICUSPID VALVE MV Area (PHT): 3.42 cm    TR Peak grad:   33.6 mmHg MV Decel Time: 222 msec    TR Vmax:        290.00 cm/s MV E velocity: 63.00 cm/s MV Taysean Wager  velocity: 71.60 cm/s  SHUNTS MV E/Michaelann Gunnoe ratio:  0.88        Systemic VTI:  0.28 m                            Systemic Diam: 2.20 cm Charlton Haws MD Electronically signed by Charlton Haws MD Signature Date/Time: 06/30/2023/1:19:56 PM  Final    CT Angio Chest PE W/Cm &/Or Wo Cm Result Date: 06/30/2023 CLINICAL DATA:  Hypoxia EXAM: CT ANGIOGRAPHY CHEST WITH CONTRAST TECHNIQUE: Multidetector CT imaging of the chest was performed using the standard protocol during bolus administration of intravenous contrast. Multiplanar CT image reconstructions and MIPs were obtained to evaluate the vascular anatomy. RADIATION DOSE REDUCTION: This exam was performed according to the departmental dose-optimization program which includes automated exposure control, adjustment of the mA and/or kV according to patient size and/or use of iterative reconstruction technique. CONTRAST:  75mL OMNIPAQUE IOHEXOL 350 MG/ML SOLN COMPARISON:  Chest x-ray 06/29/2023, CT chest 09/07/2021 FINDINGS: Cardiovascular: Satisfactory opacification of the pulmonary arteries to the segmental level. No evidence of pulmonary embolism. Mild cardiomegaly. No sizable pericardial effusion. Mild atherosclerosis. No aneurysm. Dilated azygous vein. Mediastinum/Nodes: Patent trachea. No thyroid mass. Multiple enlarged mediastinal lymph nodes. Prevascular lymph nodes measuring up to 9 mm. Right paratracheal nodes measuring up to 13 mm. Left paratracheal nodes measuring up to 14 mm. Subcarinal lymph nodes measuring up to 17 mm. Ill-defined right greater than left hilar/peribronchovascular soft tissue density without Muzammil Bruins discrete mass. Esophagus unremarkable. Lungs/Pleura: Partial lower lobe consolidations. Widespread heterogeneous ground-glass densities and consolidations throughout the bilateral lungs. No significant pleural effusion. Prominent peribronchovascular soft tissue thickening. 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 No acute osseous abnormality. Probable Schmorl's node and mild superior endplate deformity at T12. Review of the MIP images confirms the above findings. IMPRESSION: 1. Negative for acute pulmonary embolus. 2. Widespread bilateral heterogeneous consolidations and ground-glass disease with partial lower lobe consolidations suspicious for multifocal infection. Diffuse bilateral peribronchovascular thickening right greater than left which may be inflammatory or infectious. Suspicion of mild right hilar adenopathy and multiple mildly enlarged mediastinal lymph nodes potentially reactive but attention on CT follow-up. 3. 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 Cardiomegaly Aortic Atherosclerosis (ICD10-I70.0). Electronically Signed   By: Jasmine Pang M.D.   On: 06/30/2023 00:25   DG Chest Portable 1 View Result Date: 06/29/2023 CLINICAL DATA:  hypoxia, cough EXAM: PORTABLE CHEST 1 VIEW COMPARISON:  09/07/2021 chest radiograph. FINDINGS: Thickening of the right paratracheal stripe with asymmetric prominence of the right hilum, increased. Stable mild cardiomegaly. No pneumothorax or pleural effusion. Streaky bibasilar lung opacities, similar to prior radiograph. IMPRESSION: 1. Thickening of the right paratracheal stripe with asymmetric prominence of the right hilum, increased. Chest CT with IV contrast recommended for further evaluation to exclude underlying adenopathy or mass. 2. Stable mild cardiomegaly. 3. Streaky bibasilar lung opacities, similar to prior radiograph, favor scarring or atelectasis. Electronically Signed   By: Delbert Phenix M.D.   On: 06/29/2023 21:27        Scheduled Meds:  Chlorhexidine Gluconate Cloth  6 each Topical Daily   enoxaparin (LOVENOX) injection  40 mg Subcutaneous Q24H   ferrous sulfate  325 mg Oral Daily   OXcarbazepine  150 mg Oral BID   potassium chloride  40 mEq Oral Q4H   valACYclovir  1,000 mg Oral Daily   Continuous  Infusions:  azithromycin Stopped (06/30/23 1807)   cefTRIAXone (ROCEPHIN)  IV Stopped (06/30/23 1602)     LOS: 2 days    Time spent: over 30 min    Lacretia Nicks, MD [facility]   To contact the attending provider between 7A-7P or the covering provider during after hours 7P-7A, please log into the web site [email or web address] and access using universal  password for that web  site. If you do not have the password, please call the hospital operator.  07/01/2023, 8:39 AM

## 2023-07-02 DIAGNOSIS — R0902 Hypoxemia: Secondary | ICD-10-CM | POA: Diagnosis not present

## 2023-07-02 LAB — COMPREHENSIVE METABOLIC PANEL
ALT: 17 U/L (ref 0–44)
AST: 26 U/L (ref 15–41)
Albumin: 2.5 g/dL — ABNORMAL LOW (ref 3.5–5.0)
Alkaline Phosphatase: 67 U/L (ref 38–126)
Anion gap: 8 (ref 5–15)
BUN: 17 mg/dL (ref 8–23)
CO2: 28 mmol/L (ref 22–32)
Calcium: 8.6 mg/dL — ABNORMAL LOW (ref 8.9–10.3)
Chloride: 98 mmol/L (ref 98–111)
Creatinine, Ser: 0.6 mg/dL (ref 0.44–1.00)
GFR, Estimated: >60 mL/min (ref >60)
Glucose, Bld: 134 mg/dL — ABNORMAL HIGH (ref 70–99)
Potassium: 4.3 mmol/L (ref 3.5–5.1)
Sodium: 134 mmol/L — ABNORMAL LOW (ref 135–145)
Total Bilirubin: 0.6 mg/dL (ref 0.0–1.2)
Total Protein: 5.6 g/dL — ABNORMAL LOW (ref 6.5–8.1)

## 2023-07-02 LAB — CBC WITH DIFFERENTIAL/PLATELET
Abs Immature Granulocytes: 0.11 10*3/uL — ABNORMAL HIGH (ref 0.00–0.07)
Basophils Absolute: 0.1 10*3/uL (ref 0.0–0.1)
Basophils Relative: 1 %
Eosinophils Absolute: 0.2 10*3/uL (ref 0.0–0.5)
Eosinophils Relative: 2 %
HCT: 35.6 % — ABNORMAL LOW (ref 36.0–46.0)
Hemoglobin: 11.7 g/dL — ABNORMAL LOW (ref 12.0–15.0)
Immature Granulocytes: 1 %
Lymphocytes Relative: 10 %
Lymphs Abs: 1.4 10*3/uL (ref 0.7–4.0)
MCH: 33.4 pg (ref 26.0–34.0)
MCHC: 32.9 g/dL (ref 30.0–36.0)
MCV: 101.7 fL — ABNORMAL HIGH (ref 80.0–100.0)
Monocytes Absolute: 2 10*3/uL — ABNORMAL HIGH (ref 0.1–1.0)
Monocytes Relative: 14 %
Neutro Abs: 10.5 10*3/uL — ABNORMAL HIGH (ref 1.7–7.7)
Neutrophils Relative %: 72 %
Platelets: 344 10*3/uL (ref 150–400)
RBC: 3.5 MIL/uL — ABNORMAL LOW (ref 3.87–5.11)
RDW: 12.5 % (ref 11.5–15.5)
WBC: 14.3 10*3/uL — ABNORMAL HIGH (ref 4.0–10.5)
nRBC: 0 % (ref 0.0–0.2)

## 2023-07-02 LAB — PHOSPHORUS: Phosphorus: 3 mg/dL (ref 2.5–4.6)

## 2023-07-02 LAB — MAGNESIUM: Magnesium: 1.9 mg/dL (ref 1.7–2.4)

## 2023-07-02 MED ORDER — ALUM & MAG HYDROXIDE-SIMETH 200-200-20 MG/5ML PO SUSP
30.0000 mL | ORAL | Status: DC | PRN
  Administered 2023-07-02 – 2023-07-03 (×4): 30 mL via ORAL
  Filled 2023-07-02 (×4): qty 30

## 2023-07-02 MED ORDER — FUROSEMIDE 10 MG/ML IJ SOLN
40.0000 mg | Freq: Once | INTRAMUSCULAR | Status: AC
  Administered 2023-07-02: 40 mg via INTRAVENOUS
  Filled 2023-07-02: qty 4

## 2023-07-02 NOTE — Progress Notes (Signed)
 PT Cancellation Note  Patient Details Name: Carolyn Mckinney MRN: [id number] DOB: 12/24/1945   Cancelled Treatment:    Reason Eval/Treat Not Completed: Fatigue/lethargy limiting ability to participate Patient up in recliner most of morning, asking to  start PT tomorrow. Will check back in AM.  Blanchard Kelch PT Acute Rehabilitation Services Office [phone]  Rada Hay 07/02/2023, 4:13 PM

## 2023-07-02 NOTE — Plan of Care (Signed)
  Problem: Fluid Volume: Goal: Hemodynamic stability will improve Outcome: Progressing   Problem: Clinical Measurements: Goal: Diagnostic test results will improve Outcome: Progressing Goal: Signs and symptoms of infection will decrease Outcome: Progressing   Problem: Respiratory: Goal: Ability to maintain adequate ventilation will improve Outcome: Progressing   Problem: Education: Goal: Knowledge of General Education information will improve Description: Including pain rating scale, medication(s)/side effects and non-pharmacologic comfort measures Outcome: Progressing   Problem: Health Behavior/Discharge Planning: Goal: Ability to manage health-related needs will improve Outcome: Progressing   Problem: Clinical Measurements: Goal: Ability to maintain clinical measurements within normal limits will improve Outcome: Progressing Goal: Will remain free from infection Outcome: Progressing Goal: Diagnostic test results will improve Outcome: Progressing Goal: Respiratory complications will improve Outcome: Progressing Goal: Cardiovascular complication will be avoided Outcome: Progressing   Problem: Activity: Goal: Risk for activity intolerance will decrease Outcome: Progressing   Problem: Nutrition: Goal: Adequate nutrition will be maintained Outcome: Progressing   Problem: Coping: Goal: Level of anxiety will decrease Outcome: Progressing   Problem: Elimination: Goal: Will not experience complications related to bowel motility Outcome: Progressing Goal: Will not experience complications related to urinary retention Outcome: Progressing   Problem: Pain Managment: Goal: General experience of comfort will improve and/or be controlled Outcome: Progressing   Problem: Safety: Goal: Ability to remain free from injury will improve Outcome: Progressing   Problem: Skin Integrity: Goal: Risk for impaired skin integrity will decrease Outcome: Progressing   Cindy S. Clelia Croft  BSN, RN, CCRP, CCRN 07/02/2023 1:24 AM

## 2023-07-02 NOTE — Progress Notes (Signed)
 PROGRESS NOTE    Carolyn Mckinney  NWG:[id number] DOB: 10/17/1945 DOA: 06/29/2023 PCP: Sharlene Dory, DO  Chief Complaint  Patient presents with   Shortness of Breath    Brief Narrative:   78 yo with hx HTN, depression, breast cancer and multiple other medical issues here with acute hypoxic respiratory failure due to community acquired pneumonia.   Assessment & Plan:   Principal Problem:   CAP (community acquired pneumonia) Active Problems:   Essential hypertension   Acute hypoxic respiratory failure (HCC)   Severe sepsis (HCC)   Leukocytosis   Acute hyponatremia   Elevated troponin   Depression   Hypoxia  Acute Hypoxic Respiratory Failure Community Acquired Pneumonia Requiring 10 L HFNC this AM Met criteria for sepsis on presentation with leukocytosis, hypoxia and pneumonia CT chest without PE, but widespread bilateral heterogeneous consolidations and ground glass disease with partial lower lobe consolidations concerning for multifocal infectino.  Diffuse bilateral peribronchovacular thickening R>L.  Mild R hilar adenopathy and multiple mildly enlarged mediastinal LN's potentially reactive (needs CT follow up) CXR 3/9 with diffuse coarse reticular and interstitial opacities Negative MRSA pcr Follow sputum cx, follow urine strep (negative) and urine legionella (negative) Negative RVP.  Negative covid, flu, RSV. No blood cultures were collected Ceftriaxone/azithromycin (3/7-present) Will keep net negative to net even, lasix as tolerated (net positive, but several unmeasured voids - weight up, not sure how accurate)  Elevated BNP Follow echo -> EF 55-60%, no RWMA, mild LVH, moderately elevated PASP.  Abnormal septal motion (not clear significance).  Will discuss with cardiology during the week to see if any follow up recommended.  Not clinically overloaded on exam Lasix to keep net negative to net even  Hypertension Hold losartan and amlodipine in setting of  presentation with sepsis  BP ok - continue holding  Depression Cymbalta, trazodone  Paresthesias Trileptal, cymbalta   Left Breast Cancer s/p lumpectomy/radiation 2001  Trigger Finger Outpatient follow up     DVT prophylaxis: lovenox Code Status: full Family Communication: none at bedside - discussed with daughter, Heywood Iles, over the phone Disposition:   Status is: Inpatient Remains inpatient appropriate because: need for ongoing inpatient care for acute hypoxic respiratory failure and community acquired pneumonia   Consultants:  none  Procedures:  none  Antimicrobials:  Anti-infectives (From admission, onward)    Start     Dose/Rate Route Frequency Ordered Stop   06/30/23 1600  azithromycin (ZITHROMAX) 500 mg in sodium chloride 0.9 % 250 mL IVPB        500 mg 250 mL/hr over 60 Minutes Intravenous Every 24 hours 06/30/23 0038     06/30/23 1600  cefTRIAXone (ROCEPHIN) 1 g in sodium chloride 0.9 % 100 mL IVPB        1 g 200 mL/hr over 30 Minutes Intravenous Every 24 hours 06/30/23 0038     06/30/23 1000  valACYclovir (VALTREX) tablet 1,000 mg        1,000 mg Oral Daily 06/30/23 0241     06/29/23 2045  cefTRIAXone (ROCEPHIN) 1 g in sodium chloride 0.9 % 100 mL IVPB        1 g 200 mL/hr over 30 Minutes Intravenous  Once 06/29/23 2031 06/29/23 2134   06/29/23 2045  azithromycin (ZITHROMAX) 500 mg in sodium chloride 0.9 % 250 mL IVPB        500 mg 250 mL/hr over 60 Minutes Intravenous  Once 06/29/23 2031 06/29/23 2237       Subjective: She feels about the  same today  Objective: Vitals:   07/02/23 0530 07/02/23 0555 07/02/23 0600 07/02/23 0700  BP:   116/62 (!) 107/48  Pulse: 81 83 82 76  Resp: (!) 22 (!) 31 (!) 26 (!) 36  Temp:      TempSrc:      SpO2: 100% 92% 100% 100%  Weight:      Height:        Intake/Output Summary (Last 24 hours) at 07/02/2023 0812 Last data filed at 07/01/2023 2230 Gross per 24 hour  Intake 450 ml  Output --  Net 450 ml    Filed Weights   06/30/23 0205 07/01/23 0500 07/02/23 0500  Weight: 64.4 kg 64.6 kg 69.5 kg    Examination:  General: No acute distress. Sitting up in chair getting ready for breakfast. Cardiovascular: RRR Lungs: crackles throughout, greater at bases Neurological: Alert and oriented 3. Moves all extremities 4 with equal strength. Cranial nerves II through XII grossly intact. Extremities: No clubbing or cyanosis. No edema.   Data Reviewed: I have personally reviewed following labs and imaging studies  CBC: Recent Labs  Lab 06/29/23 1734 06/30/23 0140 07/01/23 0332 07/02/23 0241  WBC 13.7* 18.0* 13.4* 14.3*  NEUTROABS 10.1* 14.5* 10.1* 10.5*  HGB 12.2 13.5 11.4* 11.7*  HCT 37.1 40.8 34.1* 35.6*  MCV 101.4* 102.3* 99.4 101.7*  PLT 355 373 328 344    Basic Metabolic Panel: Recent Labs  Lab 06/29/23 1734 06/30/23 0140 07/01/23 0332 07/02/23 0241  NA 133* 135 132* 134*  K 3.9 3.9 3.3* 4.3  CL 97* 98 95* 98  CO2 28 26 28 28   GLUCOSE 100* 119* 126* 134*  BUN 15 13 13 17   CREATININE 0.73 0.72 0.56 0.60  CALCIUM 9.2 8.8* 8.2* 8.6*  MG  --  1.9  1.7 1.8 1.9  PHOS  --  4.3 3.2 3.0    GFR: Estimated Creatinine Clearance: 51.7 mL/min (by C-G formula based on SCr of 0.6 mg/dL).  Liver Function Tests: Recent Labs  Lab 06/29/23 1734 06/30/23 0140 07/01/23 0332 07/02/23 0241  AST 20 25 21 26   ALT 13 15 13 17   ALKPHOS 66 72 61 67  BILITOT 1.0 0.8 0.8 0.6  PROT 6.2* 6.7 5.5* 5.6*  ALBUMIN 3.1* 3.4* 2.6* 2.5*    CBG: Recent Labs  Lab 07/01/23 2000  GLUCAP 143*     Recent Results (from the past 240 hours)  Resp panel by RT-PCR (RSV, Flu Sherlock Nancarrow&B, Covid) Anterior Nasal Swab     Status: None   Collection Time: 06/29/23  6:00 PM   Specimen: Anterior Nasal Swab  Result Value Ref Range Status   SARS Coronavirus 2 by RT PCR NEGATIVE NEGATIVE Final    Comment: (NOTE) SARS-CoV-2 target nucleic acids are NOT DETECTED.  The SARS-CoV-2 RNA is generally detectable  in upper respiratory specimens during the acute phase of infection. The lowest concentration of SARS-CoV-2 viral copies this assay can detect is 138 copies/mL. Yaminah Clayborn negative result does not preclude SARS-Cov-2 infection and should not be used as the sole basis for treatment or other patient management decisions. Daxon Kyne negative result may occur with  improper specimen collection/handling, submission of specimen other than nasopharyngeal swab, presence of viral mutation(s) within the areas targeted by this assay, and inadequate number of viral copies(<138 copies/mL). Billye Pickerel negative result must be combined with clinical observations, patient history, and epidemiological information. The expected result is Negative.  Fact Sheet for Patients:  BloggerCourse.com  Fact Sheet for Healthcare Providers:  SeriousBroker.it  This test is no t yet approved or cleared by the [place] and  has been authorized for detection and/or diagnosis of SARS-CoV-2 by FDA under an Emergency Use Authorization (EUA). This EUA will remain  in effect (meaning this test can be used) for the duration of the COVID-19 declaration under Section 564(b)(1) of the Act, 21 U.S.C.section 360bbb-3(b)(1), unless the authorization is terminated  or revoked sooner.       Influenza Red Mandt by PCR NEGATIVE NEGATIVE Final   Influenza B by PCR NEGATIVE NEGATIVE Final    Comment: (NOTE) The Xpert Xpress SARS-CoV-2/FLU/RSV plus assay is intended as an aid in the diagnosis of influenza from Nasopharyngeal swab specimens and should not be used as Kida Digiulio sole basis for treatment. Nasal washings and aspirates are unacceptable for Xpert Xpress SARS-CoV-2/FLU/RSV testing.  Fact Sheet for Patients: BloggerCourse.com  Fact Sheet for Healthcare Providers: SeriousBroker.it  This test is not yet approved or cleared by the [place] FDA and has  been authorized for detection and/or diagnosis of SARS-CoV-2 by FDA under an Emergency Use Authorization (EUA). This EUA will remain in effect (meaning this test can be used) for the duration of the COVID-19 declaration under Section 564(b)(1) of the Act, 21 U.S.C. section 360bbb-3(b)(1), unless the authorization is terminated or revoked.     Resp Syncytial Virus by PCR NEGATIVE NEGATIVE Final    Comment: (NOTE) Fact Sheet for Patients: BloggerCourse.com  Fact Sheet for Healthcare Providers: SeriousBroker.it  This test is not yet approved or cleared by the [place] FDA and has been authorized for detection and/or diagnosis of SARS-CoV-2 by FDA under an Emergency Use Authorization (EUA). This EUA will remain in effect (meaning this test can be used) for the duration of the COVID-19 declaration under Section 564(b)(1) of the Act, 21 U.S.C. section 360bbb-3(b)(1), unless the authorization is terminated or revoked.  Performed at [facility], [address], [place], [place] [address]   MRSA Next Gen by PCR, Nasal     Status: None   Collection Time: 06/30/23  1:40 AM   Specimen: Urine, Clean Catch; Nasal Swab  Result Value Ref Range Status   MRSA by PCR Next Gen NOT DETECTED NOT DETECTED Final    Comment: (NOTE) The GeneXpert MRSA Assay (FDA approved for NASAL specimens only), is one component of Kawanna Christley comprehensive MRSA colonization surveillance program. It is not intended to diagnose MRSA infection nor to guide or monitor treatment for MRSA infections. Test performance is not FDA approved in patients less than 15 years old. Performed at [facility], [address] [place], [place] [address]   Respiratory (~20 pathogens) panel by PCR     Status: None   Collection Time: 06/30/23  9:00 AM   Specimen: Nasopharyngeal Swab; Respiratory  Result Value Ref Range Status   Adenovirus NOT DETECTED  NOT DETECTED Final   Coronavirus 229E NOT DETECTED NOT DETECTED Final    Comment: (NOTE) The Coronavirus on the Respiratory Panel, DOES NOT test for the novel  Coronavirus (2019 nCoV)    Coronavirus HKU1 NOT DETECTED NOT DETECTED Final   Coronavirus NL63 NOT DETECTED NOT DETECTED Final   Coronavirus OC43 NOT DETECTED NOT DETECTED Final   Metapneumovirus NOT DETECTED NOT DETECTED Final   Rhinovirus / Enterovirus NOT DETECTED NOT DETECTED Final   Influenza Milley Vining NOT DETECTED NOT DETECTED Final   Influenza B NOT DETECTED NOT DETECTED Final   Parainfluenza Virus 1 NOT DETECTED NOT DETECTED Final   Parainfluenza Virus  2 NOT DETECTED NOT DETECTED Final   Parainfluenza Virus 3 NOT DETECTED NOT DETECTED Final   Parainfluenza Virus 4 NOT DETECTED NOT DETECTED Final   Respiratory Syncytial Virus NOT DETECTED NOT DETECTED Final   Bordetella pertussis NOT DETECTED NOT DETECTED Final   Bordetella Parapertussis NOT DETECTED NOT DETECTED Final   Chlamydophila pneumoniae NOT DETECTED NOT DETECTED Final   Mycoplasma pneumoniae NOT DETECTED NOT DETECTED Final    Comment: Performed at [facility], [address], [place], [place] [address]  Expectorated Sputum Assessment w Gram Stain, Rflx to Resp Cult     Status: None   Collection Time: 07/01/23 10:19 AM   Specimen: Expectorated Sputum  Result Value Ref Range Status   Specimen Description EXPECTORATED SPUTUM  Final   Special Requests NONE  Final   Sputum evaluation   Final    Sputum specimen not acceptable for testing.  Please recollect.   Informed B. Marvis Moeller, RN on 07/01/2023 at 1051 by SL Performed at [facility], [address] [place], [place] [address]    Report Status 07/01/2023 FINAL  Final  Expectorated Sputum Assessment w Gram Stain, Rflx to Resp Cult     Status: None   Collection Time: 07/01/23  3:59 PM   Specimen: Expectorated Sputum  Result Value Ref Range Status   Specimen Description EXPECTORATED SPUTUM  Final    Special Requests NONE  Final   Sputum evaluation   Final    THIS SPECIMEN IS ACCEPTABLE FOR SPUTUM CULTURE Performed at [facility], [address] [place], [place] [address]    Report Status 07/01/2023 FINAL  Final  Culture, Respiratory w Gram Stain     Status: None (Preliminary result)   Collection Time: 07/01/23  3:59 PM  Result Value Ref Range Status   Specimen Description   Final    EXPECTORATED SPUTUM Performed at [facility], [address] [place], [place] [address]    Special Requests   Final    NONE Reflexed from [id number] Performed at [facility], [address] [place], [place] [address]    Gram Stain   Final    RARE WBC PRESENT, PREDOMINANTLY PMN NO ORGANISMS SEEN Performed at [facility], [address], [place], [place] [address]    Culture PENDING  Incomplete   Report Status PENDING  Incomplete         Radiology Studies: DG CHEST PORT 1 VIEW Result Date: 07/01/2023 CLINICAL DATA:  [id number] Hypoxia [id number] EXAM: PORTABLE CHEST 1 VIEW COMPARISON:  June 29, 2023 FINDINGS: The cardiomediastinal silhouette is unchanged and enlarged in contour. No significant pleural effusion. No pneumothorax. Background of diffuse coarse reticular and interstitial opacities, favored slightly increased in comparison to prior. IMPRESSION: Background of diffuse coarse reticular and interstitial opacities, favored slightly increased in comparison to prior. This could reflect atypical infection or edema. Electronically Signed   By: Meda Klinefelter M.D.   On: 07/01/2023 12:25   ECHOCARDIOGRAM COMPLETE Result Date: 06/30/2023    ECHOCARDIOGRAM REPORT   Patient Name:   Carolyn Mckinney Date of Exam: 06/30/2023 Medical Rec #:  [id number]      Height:       61.0 in Accession #:    [id number]     Weight:       142.0 lb Date of Birth:  [birth year]     BSA:          1.633 m Patient Age:  77 years       BP:           126/74 mmHg Patient  Gender: F              HR:           76 bpm. Exam Location:  Inpatient Procedure: 2D Echo, Cardiac Doppler and Color Doppler (Both Spectral and Color            Flow Doppler were utilized during procedure). Indications:    R06.02 SOB. Elevated troponin.  History:        Patient has no prior history of Echocardiogram examinations.                 Signs/Symptoms:Bacteremia, Shortness of Breath, Chest Pain and                 Dyspnea; Risk Factors:Hypertension. Breast cancer. Pneumonia.  Sonographer:    Sheralyn Boatman RDCS Referring Phys: [id number] Angie Fava  Sonographer Comments: Suboptimal apical window. IMPRESSIONS  1. Abnormal septal motion. Left ventricular ejection fraction, by estimation, is 55 to 60%. The left ventricle has normal function. The left ventricle has no regional wall motion abnormalities. There is mild left ventricular hypertrophy. Left ventricular diastolic parameters were normal.  2. Right ventricular systolic function is normal. The right ventricular size is normal. There is moderately elevated pulmonary artery systolic pressure.  3. The mitral valve is normal in structure. Trivial mitral valve regurgitation. No evidence of mitral stenosis.  4. The aortic valve is tricuspid. Aortic valve regurgitation is not visualized. No aortic stenosis is present.  5. The inferior vena cava is normal in size with greater than 50% respiratory variability, suggesting right atrial pressure of 3 mmHg. FINDINGS  Left Ventricle: Abnormal septal motion. Left ventricular ejection fraction, by estimation, is 55 to 60%. The left ventricle has normal function. The left ventricle has no regional wall motion abnormalities. Strain was performed and the global longitudinal strain is indeterminate. The left ventricular internal cavity size was normal in size. There is mild left ventricular hypertrophy. Left ventricular diastolic parameters were normal. Right Ventricle: The right ventricular size is normal. No increase in  right ventricular wall thickness. Right ventricular systolic function is normal. There is moderately elevated pulmonary artery systolic pressure. The tricuspid regurgitant velocity is 2.90 m/s, and with an assumed right atrial pressure of 15 mmHg, the estimated right ventricular systolic pressure is 48.6 mmHg. Left Atrium: Left atrial size was normal in size. Right Atrium: Right atrial size was normal in size. Pericardium: There is no evidence of pericardial effusion. Mitral Valve: The mitral valve is normal in structure. Trivial mitral valve regurgitation. No evidence of mitral valve stenosis. Tricuspid Valve: The tricuspid valve is normal in structure. Tricuspid valve regurgitation is mild . No evidence of tricuspid stenosis. Aortic Valve: The aortic valve is tricuspid. Aortic valve regurgitation is not visualized. No aortic stenosis is present. Pulmonic Valve: The pulmonic valve was normal in structure. Pulmonic valve regurgitation is not visualized. No evidence of pulmonic stenosis. Aorta: The aortic root is normal in size and structure. Venous: The inferior vena cava is normal in size with greater than 50% respiratory variability, suggesting right atrial pressure of 3 mmHg. IAS/Shunts: No atrial level shunt detected by color flow Doppler. Additional Comments: 3D was performed not requiring image post processing on an independent workstation and was indeterminate.  LEFT VENTRICLE PLAX 2D LVIDd:         4.00 cm  Diastology LVIDs:         2.60 cm     LV e' medial:    7.40 cm/s LV PW:         1.20 cm     LV E/e' medial:  8.5 LV IVS:        1.10 cm     LV e' lateral:   10.30 cm/s LVOT diam:     2.20 cm     LV E/e' lateral: 6.1 LV SV:         105 LV SV Index:   64 LVOT Area:     3.80 cm  LV Volumes (MOD) LV vol d, MOD A2C: 40.9 ml LV vol d, MOD A4C: 46.0 ml LV vol s, MOD A2C: 11.7 ml LV vol s, MOD A4C: 16.8 ml LV SV MOD A2C:     29.2 ml LV SV MOD A4C:     46.0 ml LV SV MOD BP:      30.3 ml RIGHT VENTRICLE              IVC RV S prime:     18.00 cm/s  IVC diam: 1.00 cm TAPSE (M-mode): 2.1 cm LEFT ATRIUM             Index        RIGHT ATRIUM           Index LA diam:        2.10 cm 1.29 cm/m   RA Area:     12.80 cm LA Vol (A2C):   32.0 ml 19.60 ml/m  RA Volume:   30.80 ml  18.86 ml/m LA Vol (A4C):   22.7 ml 13.90 ml/m LA Biplane Vol: 26.7 ml 16.35 ml/m  AORTIC VALVE             PULMONIC VALVE LVOT Vmax:   143.00 cm/s PR End Diast Vel: 1.67 msec LVOT Vmean:  95.800 cm/s LVOT VTI:    0.275 m  AORTA Ao Root diam: 2.90 cm Ao Asc diam:  3.60 cm MITRAL VALVE               TRICUSPID VALVE MV Area (PHT): 3.42 cm    TR Peak grad:   33.6 mmHg MV Decel Time: 222 msec    TR Vmax:        290.00 cm/s MV E velocity: 63.00 cm/s MV Rogue Pautler velocity: 71.60 cm/s  SHUNTS MV E/Ned Kakar ratio:  0.88        Systemic VTI:  0.28 m                            Systemic Diam: 2.20 cm Charlton Haws MD Electronically signed by Charlton Haws MD Signature Date/Time: 06/30/2023/1:19:56 PM    Final         Scheduled Meds:  Chlorhexidine Gluconate Cloth  6 each Topical Daily   enoxaparin (LOVENOX) injection  40 mg Subcutaneous Q24H   ferrous sulfate  325 mg Oral Daily   OXcarbazepine  150 mg Oral BID   valACYclovir  1,000 mg Oral Daily   Continuous Infusions:  azithromycin Stopped (07/01/23 1734)   cefTRIAXone (ROCEPHIN)  IV Stopped (07/01/23 1628)     LOS: 3 days    Time spent: over 30 min    Lacretia Nicks, MD [facility]   To contact the attending provider between 7A-7P or the covering provider during after hours 7P-7A, please log into the web site [email or web address]  and access using universal [place] password for that web site. If you do not have the password, please call the hospital operator.  07/02/2023, 8:12 AM

## 2023-07-02 NOTE — Plan of Care (Signed)

## 2023-07-03 DIAGNOSIS — J189 Pneumonia, unspecified organism: Secondary | ICD-10-CM | POA: Diagnosis not present

## 2023-07-03 DIAGNOSIS — R0902 Hypoxemia: Secondary | ICD-10-CM | POA: Diagnosis not present

## 2023-07-03 DIAGNOSIS — I2729 Other secondary pulmonary hypertension: Secondary | ICD-10-CM

## 2023-07-03 DIAGNOSIS — J9601 Acute respiratory failure with hypoxia: Secondary | ICD-10-CM | POA: Diagnosis not present

## 2023-07-03 DIAGNOSIS — I10 Essential (primary) hypertension: Secondary | ICD-10-CM | POA: Diagnosis not present

## 2023-07-03 LAB — COMPREHENSIVE METABOLIC PANEL
ALT: 20 U/L (ref 0–44)
AST: 28 U/L (ref 15–41)
Albumin: 2.7 g/dL — ABNORMAL LOW (ref 3.5–5.0)
Alkaline Phosphatase: 67 U/L (ref 38–126)
Anion gap: 10 (ref 5–15)
BUN: 14 mg/dL (ref 8–23)
CO2: 28 mmol/L (ref 22–32)
Calcium: 8.8 mg/dL — ABNORMAL LOW (ref 8.9–10.3)
Chloride: 95 mmol/L — ABNORMAL LOW (ref 98–111)
Creatinine, Ser: 0.62 mg/dL (ref 0.44–1.00)
GFR, Estimated: >60 mL/min (ref >60)
Glucose, Bld: 114 mg/dL — ABNORMAL HIGH (ref 70–99)
Potassium: 4.4 mmol/L (ref 3.5–5.1)
Sodium: 133 mmol/L — ABNORMAL LOW (ref 135–145)
Total Bilirubin: 0.7 mg/dL (ref 0.0–1.2)
Total Protein: 5.9 g/dL — ABNORMAL LOW (ref 6.5–8.1)

## 2023-07-03 LAB — CULTURE, RESPIRATORY W GRAM STAIN

## 2023-07-03 LAB — CBC WITH DIFFERENTIAL/PLATELET
Abs Immature Granulocytes: 0.07 10*3/uL (ref 0.00–0.07)
Basophils Absolute: 0.1 10*3/uL (ref 0.0–0.1)
Basophils Relative: 1 %
Eosinophils Absolute: 0.4 10*3/uL (ref 0.0–0.5)
Eosinophils Relative: 3 %
HCT: 36.3 % (ref 36.0–46.0)
Hemoglobin: 11.8 g/dL — ABNORMAL LOW (ref 12.0–15.0)
Immature Granulocytes: 1 %
Lymphocytes Relative: 11 %
Lymphs Abs: 1.4 10*3/uL (ref 0.7–4.0)
MCH: 33.1 pg (ref 26.0–34.0)
MCHC: 32.5 g/dL (ref 30.0–36.0)
MCV: 101.7 fL — ABNORMAL HIGH (ref 80.0–100.0)
Monocytes Absolute: 1.7 10*3/uL — ABNORMAL HIGH (ref 0.1–1.0)
Monocytes Relative: 14 %
Neutro Abs: 8.6 10*3/uL — ABNORMAL HIGH (ref 1.7–7.7)
Neutrophils Relative %: 70 %
Platelets: 366 10*3/uL (ref 150–400)
RBC: 3.57 MIL/uL — ABNORMAL LOW (ref 3.87–5.11)
RDW: 12.3 % (ref 11.5–15.5)
WBC: 12.2 10*3/uL — ABNORMAL HIGH (ref 4.0–10.5)
nRBC: 0 % (ref 0.0–0.2)

## 2023-07-03 LAB — PHOSPHORUS: Phosphorus: 3.2 mg/dL (ref 2.5–4.6)

## 2023-07-03 LAB — MAGNESIUM: Magnesium: 1.9 mg/dL (ref 1.7–2.4)

## 2023-07-03 LAB — SEDIMENTATION RATE: Sed Rate: 76 mm/h — ABNORMAL HIGH (ref 0–22)

## 2023-07-03 LAB — C-REACTIVE PROTEIN: CRP: 15.6 mg/dL — ABNORMAL HIGH (ref <1.0)

## 2023-07-03 MED ORDER — POLYETHYLENE GLYCOL 3350 17 G PO PACK
17.0000 g | PACK | Freq: Two times a day (BID) | ORAL | Status: DC
  Administered 2023-07-03 – 2023-07-06 (×4): 17 g via ORAL
  Filled 2023-07-03 (×7): qty 1

## 2023-07-03 MED ORDER — FUROSEMIDE 10 MG/ML IJ SOLN
40.0000 mg | Freq: Once | INTRAMUSCULAR | Status: AC
  Administered 2023-07-03: 40 mg via INTRAVENOUS
  Filled 2023-07-03: qty 4

## 2023-07-03 NOTE — Plan of Care (Signed)
  Problem: Fluid Volume: Goal: Hemodynamic stability will improve Outcome: Progressing   Problem: Education: Goal: Knowledge of General Education information will improve Description: Including pain rating scale, medication(s)/side effects and non-pharmacologic comfort measures Outcome: Progressing   Problem: Clinical Measurements: Goal: Ability to maintain clinical measurements within normal limits will improve Outcome: Progressing Goal: Cardiovascular complication will be avoided Outcome: Progressing   Problem: Nutrition: Goal: Adequate nutrition will be maintained Outcome: Progressing   Problem: Coping: Goal: Level of anxiety will decrease Outcome: Progressing   Problem: Elimination: Goal: Will not experience complications related to urinary retention Outcome: Progressing   Problem: Clinical Measurements: Goal: Signs and symptoms of infection will decrease Outcome: Not Progressing   Problem: Clinical Measurements: Goal: Respiratory complications will improve Outcome: Not Progressing   Problem: Activity: Goal: Risk for activity intolerance will decrease Outcome: Not Progressing   Problem: Elimination: Goal: Will not experience complications related to bowel motility Outcome: Not Progressing   Problem: Pain Managment: Goal: General experience of comfort will improve and/or be controlled Outcome: Not Progressing

## 2023-07-03 NOTE — Evaluation (Signed)
 Physical Therapy Evaluation Patient Details Name: Carolyn Mckinney MRN: [id number] DOB: 1946-02-16 Today's Date: 07/03/2023  History of Present Illness  Carolyn Mckinney is a 78 y.o. female admitted to [facility] on 06/29/2023 with severe sepsis due to suspected community-acquired pneumonia with acute hypoxic respiratory failure. PHMx: essential hypertension, depression, left breast cancer in 2001 status post lumpectomy/radiation.  Clinical Impression  Pt admitted with above diagnosis.  Pt currently with functional limitations due to the deficits listed below (see PT Problem List). Pt will benefit from acute skilled PT to increase their independence and safety with mobility to allow discharge.     The  patient demonstrates decreased balance when ambulating and standing, leans posteriorly. Patient also reports L foot pain, H/O fracture.  Patient resides alone and is independnet at baseline.  Patient  ambulated on 10 L HFNC, SPO2 >88%.  . Patient will benefit from intensive inpatient follow-up therapy, >3 hours/day.       If plan is discharge home, recommend the following: A little help with walking and/or transfers;A little help with bathing/dressing/bathroom;Assistance with cooking/housework;Assist for transportation;Help with stairs or ramp for entrance   Can travel by private vehicle        Equipment Recommendations None recommended by PT  Recommendations for Other Services       Functional Status Assessment Patient has had a recent decline in their functional status and demonstrates the ability to make significant improvements in function in a reasonable and predictable amount of time.     Precautions / Restrictions Precautions Precautions: Fall Precaution/Restrictions Comments: on HFNC Restrictions Weight Bearing Restrictions Per Provider Order: No      Mobility  Bed Mobility Overal bed mobility: Needs Assistance Bed Mobility: Supine to Sit     Supine to sit:  Contact guard, HOB elevated, Used rails     General bed mobility comments: increased time and effort    Transfers Overall transfer level: Needs assistance Equipment used: 1 person hand held assist, Rolling walker (2 wheels) Transfers: Sit to/from Stand Sit to Stand: Min assist           General transfer comment: Once she stood, she had a tendency for posterior lean with min A to correct and needed constant pressure around her to standing upright without posterior lean while ambulating to bathroom.    Ambulation/Gait Ambulation/Gait assistance: Min assist Gait Distance (Feet): 30 Feet Assistive device: Rolling walker (2 wheels) Gait Pattern/deviations: Antalgic, Step-through pattern Gait velocity: decr     General Gait Details: antalgic right foot  Stairs            Wheelchair Mobility     Tilt Bed    Modified Rankin (Stroke Patients Only)       Balance Overall balance assessment: Needs assistance Sitting-balance support: No upper extremity supported, Feet supported Sitting balance-Leahy Scale: Fair       Standing balance-Leahy Scale: Poor Standing balance comment: standing at sink to wash hands and face--pt felt like she needed to lean up against the sink for more stability, ankles definitely working overtime in standing without UE support (with back and forth slight sway)                             Pertinent Vitals/Pain Pain Assessment Pain Assessment: Faces Faces Pain Scale: Hurts little more Pain Location: left foot    Home Living Family/patient expects to be discharged to:: Private residence Living Arrangements: Alone Available Help at Discharge: Family  Type of Home: House Home Access: Level entry       Home Layout: One level Home Equipment: Agricultural consultant (2 wheels) Additional Comments: does not use any AD per her report    Prior Function Prior Level of Function : Independent/Modified Independent;Driving              Mobility Comments: pt reports being "very active" ADLs Comments: independent     Extremity/Trunk Assessment   Upper Extremity Assessment Upper Extremity Assessment: Defer to OT evaluation    Lower Extremity Assessment Lower Extremity Assessment: Generalized weakness;LLE deficits/detail LLE Deficits / Details: reports left foot pain    Cervical / Trunk Assessment Cervical / Trunk Assessment: Normal  Communication   Communication Communication: No apparent difficulties    Cognition Arousal: Alert Behavior During Therapy: Flat affect   PT - Cognitive impairments: No family/caregiver present to determine baseline, Difficult to assess, Awareness, Problem solving, Attention                       PT - Cognition Comments: at times  slow to respond to questions, Following commands: Intact       Cueing Cueing Techniques: Verbal cues     General Comments General comments (skin integrity, edema, etc.): O2 at 9 liters HFNC upon entering with sats 91%; with geeting up and about turned pt up to 10 liters (due to O2 tank can either be 8 or 10). Sats stayed above 88% during session when up and about for ADLs and mobility with most of the time 90 or above.    Exercises     Assessment/Plan    PT Assessment Patient needs continued PT services  PT Problem List Decreased strength;Decreased mobility;Decreased cognition;Pain;Decreased range of motion       PT Treatment Interventions DME instruction;Therapeutic activities;Gait training;Functional mobility training;Patient/family education    PT Goals (Current goals can be found in the Care Plan section)  Acute Rehab PT Goals Patient Stated Goal: go home to puppy PT Goal Formulation: With patient Time For Goal Achievement: 07/17/23 Potential to Achieve Goals: Good    Frequency Min 2X/week     Co-evaluation PT/OT/SLP Co-Evaluation/Treatment: Yes Reason for Co-Treatment: For patient/therapist safety;To address  functional/ADL transfers PT goals addressed during session: Mobility/safety with mobility;Balance;Proper use of DME;Strengthening/ROM OT goals addressed during session: Strengthening/ROM;ADL's and self-care       AM-PAC PT "6 Clicks" Mobility  Outcome Measure Help needed turning from your back to your side while in a flat bed without using bedrails?: None Help needed moving from lying on your back to sitting on the side of a flat bed without using bedrails?: None Help needed moving to and from a bed to a chair (including a wheelchair)?: A Little Help needed standing up from a chair using your arms (e.g., wheelchair or bedside chair)?: A Little Help needed to walk in hospital room?: A Lot Help needed climbing 3-5 steps with a railing? : Total 6 Click Score: 17    End of Session Equipment Utilized During Treatment: Gait belt Activity Tolerance: Patient tolerated treatment well Patient left: in chair;with call bell/phone within reach;with chair alarm set Nurse Communication: Mobility status PT Visit Diagnosis: Unsteadiness on feet (R26.81);Difficulty in walking, not elsewhere classified (R26.2);Pain Pain - Right/Left: Right Pain - part of body: Ankle and joints of foot    Time: 1610-9604 PT Time Calculation (min) (ACUTE ONLY): 38 min   Charges:   PT Evaluation $PT Eval Low Complexity: 1 Low   PT  General Charges $$ ACUTE PT VISIT: 1 Visit         Blanchard Kelch PT Acute Rehabilitation Services Office [phone] Weekend [phone]   Rada Hay 07/03/2023, 3:05 PM

## 2023-07-03 NOTE — Evaluation (Signed)
 Occupational Therapy Evaluation Patient Details Name: Carolyn Mckinney MRN: [id number] DOB: Jun 05, 1945 Today's Date: 07/03/2023   History of Present Illness   Carolyn Mckinney is a 78 y.o. female admitted to [facility] on 06/29/2023 with severe sepsis due to suspected community-acquired pneumonia with acute hypoxic respiratory failure. PHMx: essential hypertension, depression, left breast cancer in 2001 status post lumpectomy/radiation.     Clinical Impressions This 78 yo female admitted with above presents to acute OT with PLOF of being totally independent with basic ADLs, IADLs, driving, and "being very active". Currently she is setup-min A  for basic ADLs from a RW level and will continue to benefit from acute OT with follow up from intensive inpatient follow-up therapy, >3 hours/day to be able to go back home alone.      If plan is discharge home, recommend the following:   A little help with walking and/or transfers;A little help with bathing/dressing/bathroom     Functional Status Assessment   Patient has had a recent decline in their functional status and demonstrates the ability to make significant improvements in function in a reasonable and predictable amount of time.     Equipment Recommendations   None recommended by OT      Precautions/Restrictions   Precautions Precautions: Fall Recall of Precautions/Restrictions: Intact Restrictions Weight Bearing Restrictions Per Provider Order: No     Mobility Bed Mobility Overal bed mobility: Needs Assistance Bed Mobility: Supine to Sit     Supine to sit: Contact guard, HOB elevated, Used rails     General bed mobility comments: increased time and effort    Transfers Overall transfer level: Needs assistance Equipment used: 1 person hand held assist Transfers: Sit to/from Stand Sit to Stand: Min assist           General transfer comment: Once she stood, she had a tendency for posterior lean with  min A to correct and needed constant pressure around her to standing upright without posterior lean while ambulating to bathroom.      Balance Overall balance assessment: Needs assistance Sitting-balance support: No upper extremity supported, Feet supported Sitting balance-Leahy Scale: Fair     Standing balance support: No upper extremity supported, During functional activity Standing balance-Leahy Scale: Poor Standing balance comment: standing at sink to wash hands and face--pt felt like she needed to lean up against the sink for more stability, ankles definitely working overtime in standing without UE support (with back and forth slight sway)                           ADL either performed or assessed with clinical judgement   ADL Overall ADL's : Needs assistance/impaired Eating/Feeding: Independent;Sitting   Grooming: Set up Grooming Details (indicate cue type and reason): min A for balance standing at sink Upper Body Bathing: Set up;Sitting   Lower Body Bathing: Set up Lower Body Bathing Details (indicate cue type and reason): min A for standing balance Upper Body Dressing : Set up;Sitting   Lower Body Dressing: Set up Lower Body Dressing Details (indicate cue type and reason): min A for standing balance Toilet Transfer: Minimal assistance;Ambulation Toilet Transfer Details (indicate cue type and reason): one person HHA with pt with tendency for posterior lean, on way back from bathroom used RW and balance was better (no posterior lean) Toileting- Clothing Manipulation and Hygiene: Contact guard assist Toileting - Clothing Manipulation Details (indicate cue type and reason): min A for standing balance  Vision Patient Visual Report: No change from baseline              Pertinent Vitals/Pain Pain Assessment Pain Assessment: No/denies pain     Extremity/Trunk Assessment Upper Extremity Assessment Upper Extremity Assessment: Overall WFL for  tasks assessed           Communication Communication Communication: No apparent difficulties   Cognition Arousal: Alert Behavior During Therapy: Flat affect Cognition: No family/caregiver present to determine baseline                               Following commands: Intact       Cueing  General Comments   Cueing Techniques: Verbal cues  O2 at 9 liters HFNC upon entering with sats 91%; with geeting up and about turned pt up to 10 liters (due to O2 tank can either be 8 or 10). Sats stayed above 88% during session when up and about for ADLs and mobility with most of the time 90 or above.           Home Living Family/patient expects to be discharged to:: Private residence Living Arrangements: Alone   Type of Home: House Home Access: Level entry     Home Layout: One level     Bathroom Shower/Tub: Producer, television/film/video: Standard     Home Equipment: Agricultural consultant (2 wheels)   Additional Comments: does not use any AD per her report      Prior Functioning/Environment Prior Level of Function : Independent/Modified Independent;Driving             Mobility Comments: pt reports being "very active" ADLs Comments: independent    OT Problem List: Decreased activity tolerance;Impaired balance (sitting and/or standing);Cardiopulmonary status limiting activity   OT Treatment/Interventions: Self-care/ADL training;Energy conservation;Patient/family education;Balance training;DME and/or AE instruction      OT Goals(Current goals can be found in the care plan section)   Acute Rehab OT Goals Patient Stated Goal: to feel better and get to go home OT Goal Formulation: With patient Time For Goal Achievement: 07/17/23 Potential to Achieve Goals: Good   OT Frequency:  Min 2X/week    Co-evaluation PT/OT/SLP Co-Evaluation/Treatment: Yes Reason for Co-Treatment: For patient/therapist safety;To address functional/ADL transfers PT goals  addressed during session: Mobility/safety with mobility;Balance;Proper use of DME;Strengthening/ROM OT goals addressed during session: Strengthening/ROM;ADL's and self-care      AM-PAC OT "6 Clicks" Daily Activity     Outcome Measure Help from another person eating meals?: None Help from another person taking care of personal grooming?: A Little Help from another person toileting, which includes using toliet, bedpan, or urinal?: A Little Help from another person bathing (including washing, rinsing, drying)?: A Little Help from another person to put on and taking off regular upper body clothing?: A Little Help from another person to put on and taking off regular lower body clothing?: A Little 6 Click Score: 19   End of Session Equipment Utilized During Treatment: Gait belt;Rolling walker (2 wheels);Oxygen (10 liters) Nurse Communication: Mobility status  Activity Tolerance: Patient tolerated treatment Mckinney Patient left: in chair;with call bell/phone within reach;with chair alarm set  OT Visit Diagnosis: Unsteadiness on feet (R26.81);Other abnormalities of gait and mobility (R26.89);Muscle weakness (generalized) (M62.81);History of falling (Z91.81)                Time: 1610-9604 OT Time Calculation (min): 36 min Charges:  OT General Charges $OT Visit: 1 Visit  OT Evaluation $OT Eval Moderate Complexity: 1 Mod  Cathy L. OT Acute Rehabilitation Services Office [phone]    Evette Georges 07/03/2023, 11:38 AM

## 2023-07-03 NOTE — Consult Note (Addendum)
   NAME:  Carolyn Mckinney, MRN:  [id number], DOB:  1945-06-16, LOS: 4 ADMISSION DATE:  06/29/2023, CONSULTATION DATE: 07/03/2023 REFERRING MD: Dr Elijah Birk, CHIEF COMPLAINT: Pneumonia  History of Present Illness:  Patient admitted with shortness of breath With evidence notes congestion, cough increasing shortness of breath for about 2 weeks prior to coming to the hospital Diagnosed with a pneumonia started on antibiotics Still requiring significant amount of oxygen supplementation  Denies underlying lung disease  Pertinent  Medical History   Past Medical History:  Diagnosis Date   Allergy    Cancer (HCC)    Breast   Cataract    cataract repair bilaterally   Closed patellar sleeve fracture of right knee 05/2019   Hypertension    Meniere's disease    Personal history of radiation therapy 2001   Left Breast Cancer   Significant Hospital Events: Including procedures, antibiotic start and stop dates in addition to other pertinent events   CT chest 06/29/2023 reviewed-multifocal infiltrates  Antibiotics Azithromycin 3/7>> Ceftriaxone 3/7>>  Interim History / Subjective:  Shortness of breath, still with some chest tightness, cough, congestion  Objective   Blood pressure 133/68, pulse 73, temperature 99 F (37.2 C), temperature source Oral, resp. rate (!) 22, height 5\' 1"  (1.549 m), weight 67.3 kg, SpO2 94%.        Intake/Output Summary (Last 24 hours) at 07/03/2023 0942 Last data filed at 07/02/2023 1809 Gross per 24 hour  Intake 606.04 ml  Output --  Net 606.04 ml   Filed Weights   07/01/23 0500 07/02/23 0500 07/03/23 0500  Weight: 64.6 kg 69.5 kg 67.3 kg    Examination: General: Elderly, does not appear to be in distress HENT: Moist oral mucosa Lungs: Bibasal rales  Cardiovascular: S1-S2 appreciated Abdomen: Soft, bowel sounds appreciated Extremities: No clubbing, no edema Neuro: Alert and oriented x 3 GU:   I reviewed nursing notes, last 24 h vitals and pain  scores, last 48 h intake and output, last 24 h labs and trends, and last 24 h imaging results.  Resolved Hospital Problem list     Assessment & Plan:  Multilobar pneumonia -Recommend to complete 5 to 7 days of antibiotic therapy -May stop azithromycin, complete antibiotics with Ceftin -Respiratory viral panel negative, RSV, COVID-negative  Acute hypoxemic respiratory failure -Continue oxygen supplementation -Wean down as tolerated  Pulmonary hypertension -An infection causing multifocal infiltrate with underlying pulmonary hypertension likely making hypoxemia more apparent -Goal is still continue to aggressively treat infection -Ensure euvolemia -will need further workup of her pulmonary hypertension -Will need right heart catheterization when more stable  Rule out hypersensitivity pneumonitis Rule out connective tissue disorder leading to the interstitial process -ANA, ANCA, rheumatoid factor, CRP,esr  Hypertension -BP meds on hold  History of neuropathy, paresthesias  History of left breast cancer s/p radiation treatment in 2001  Will continue to follow  Virl Diamond, MD [place] [facility] Pager: See Loretha Stapler

## 2023-07-03 NOTE — Progress Notes (Signed)
 Inpatient Rehab Admissions Coordinator:   Per therapy recommendations, patient was screened for CIR candidacy by Megan Salon, MS, CCC-SLP. At this time, Pt. is continues to require 8-11L of oxygen via HFNC and is not medically stable for CIR. CIR will follow and rescreen once medically stable. Please contact me with any questions.   Megan Salon, MS, CCC-SLP Rehab Admissions Coordinator  [phone] (celll) [phone] (office)

## 2023-07-03 NOTE — Progress Notes (Addendum)
 PROGRESS NOTE    Carolyn Mckinney  QIH:[id number] DOB: February 20, 1946 DOA: 06/29/2023 PCP: Sharlene Dory, DO  Chief Complaint  Patient presents with   Shortness of Breath    Brief Narrative:   78 yo with hx HTN, depression, breast cancer and multiple other medical issues here with acute hypoxic respiratory failure due to community acquired pneumonia.   Assessment & Plan:   Principal Problem:   CAP (community acquired pneumonia) Active Problems:   Essential hypertension   Acute hypoxic respiratory failure (HCC)   Severe sepsis (HCC)   Leukocytosis   Acute hyponatremia   Elevated troponin   Depression   Hypoxia  Acute Hypoxic Respiratory Failure Community Acquired Pneumonia Requiring 8-11 L HFNC overnight to this AM - not notably improved over past 3 days Met criteria for sepsis on presentation with leukocytosis, hypoxia and pneumonia CT chest without PE, but widespread bilateral heterogeneous consolidations and ground glass disease with partial lower lobe consolidations concerning for multifocal infectino.  Diffuse bilateral peribronchovacular thickening R>L.  Mild R hilar adenopathy and multiple mildly enlarged mediastinal LN's potentially reactive (needs CT follow up) CXR 3/9 with diffuse coarse reticular and interstitial opacities Negative MRSA pcr Follow sputum cx, follow urine strep (negative) and urine legionella (negative) Negative RVP.  Negative covid, flu, RSV. No blood cultures were collected Ceftriaxone/azithromycin (3/7-present) Given lack of improvement after 4 days abx, continued notable O2 requirement, will ask pulmonology to consult  Will keep net negative to net even, lasix as tolerated (net positive, but several unmeasured voids - weight up, not sure how accurate)  Elevated BNP Follow echo -> EF 55-60%, no RWMA, mild LVH, moderately elevated PASP.  Abnormal septal motion (not clear significance).   Abnormal septal motion can be followed outpatient Not  clinically overloaded on exam Lasix to keep net negative to net even  Hypertension Hold losartan and amlodipine in setting of presentation with sepsis  BP ok - continue holding  Depression Cymbalta, trazodone  Paresthesias Trileptal, cymbalta   Left Breast Cancer s/p lumpectomy/radiation 2001  Trigger Finger Outpatient follow up  Constipation Bowel regimen     DVT prophylaxis: lovenox Code Status: full Family Communication: none at bedside - discussed with daughter, Heywood Iles, over the phone 3/10 Disposition:   Status is: Inpatient Remains inpatient appropriate because: need for ongoing inpatient care for acute hypoxic respiratory failure and community acquired pneumonia   Consultants:  none  Procedures:  none  Antimicrobials:  Anti-infectives (From admission, onward)    Start     Dose/Rate Route Frequency Ordered Stop   06/30/23 1600  azithromycin (ZITHROMAX) 500 mg in sodium chloride 0.9 % 250 mL IVPB        500 mg 250 mL/hr over 60 Minutes Intravenous Every 24 hours 06/30/23 0038     06/30/23 1600  cefTRIAXone (ROCEPHIN) 1 g in sodium chloride 0.9 % 100 mL IVPB        1 g 200 mL/hr over 30 Minutes Intravenous Every 24 hours 06/30/23 0038     06/30/23 1000  valACYclovir (VALTREX) tablet 1,000 mg        1,000 mg Oral Daily 06/30/23 0241     06/29/23 2045  cefTRIAXone (ROCEPHIN) 1 g in sodium chloride 0.9 % 100 mL IVPB        1 g 200 mL/hr over 30 Minutes Intravenous  Once 06/29/23 2031 06/29/23 2134   06/29/23 2045  azithromycin (ZITHROMAX) 500 mg in sodium chloride 0.9 % 250 mL IVPB  500 mg 250 mL/hr over 60 Minutes Intravenous  Once 06/29/23 2031 06/29/23 2237       Subjective: Feels constipated Otherwise, no new complaints Feels about the same  Objective: Vitals:   07/03/23 0500 07/03/23 0600 07/03/23 0700 07/03/23 0800  BP: (!) 196/106 117/68 (!) 101/48 133/68  Pulse: 83 75 71 73  Resp: (!) 25 17 20  (!) 22  Temp:  99 F (37.2 C)  99  F (37.2 C)  TempSrc:  Oral  Oral  SpO2: 91% 94% 95% 94%  Weight: 67.3 kg     Height:        Intake/Output Summary (Last 24 hours) at 07/03/2023 1027 Last data filed at 07/02/2023 1809 Gross per 24 hour  Intake 606.04 ml  Output --  Net 606.04 ml   Filed Weights   07/01/23 0500 07/02/23 0500 07/03/23 0500  Weight: 64.6 kg 69.5 kg 67.3 kg    Examination:  General: No acute distress. Cardiovascular: RRR Lungs: continued crackles, greatest at bases Abdomen: Soft, nontender, nondistended  Neurological: Alert and oriented 3. Moves all extremities 4 with equal strength. Cranial nerves II through XII grossly intact. Extremities: No clubbing or cyanosis. No edema.   Data Reviewed: I have personally reviewed following labs and imaging studies  CBC: Recent Labs  Lab 06/29/23 1734 06/30/23 0140 07/01/23 0332 07/02/23 0241 07/03/23 0256  WBC 13.7* 18.0* 13.4* 14.3* 12.2*  NEUTROABS 10.1* 14.5* 10.1* 10.5* 8.6*  HGB 12.2 13.5 11.4* 11.7* 11.8*  HCT 37.1 40.8 34.1* 35.6* 36.3  MCV 101.4* 102.3* 99.4 101.7* 101.7*  PLT 355 373 328 344 366    Basic Metabolic Panel: Recent Labs  Lab 06/29/23 1734 06/30/23 0140 07/01/23 0332 07/02/23 0241 07/03/23 0256  NA 133* 135 132* 134* 133*  K 3.9 3.9 3.3* 4.3 4.4  CL 97* 98 95* 98 95*  CO2 28 26 28 28 28   GLUCOSE 100* 119* 126* 134* 114*  BUN 15 13 13 17 14   CREATININE 0.73 0.72 0.56 0.60 0.62  CALCIUM 9.2 8.8* 8.2* 8.6* 8.8*  MG  --  1.9  1.7 1.8 1.9 1.9  PHOS  --  4.3 3.2 3.0 3.2    GFR: Estimated Creatinine Clearance: 50.9 mL/min (by C-G formula based on SCr of 0.62 mg/dL).  Liver Function Tests: Recent Labs  Lab 06/29/23 1734 06/30/23 0140 07/01/23 0332 07/02/23 0241 07/03/23 0256  AST 20 25 21 26 28   ALT 13 15 13 17 20   ALKPHOS 66 72 61 67 67  BILITOT 1.0 0.8 0.8 0.6 0.7  PROT 6.2* 6.7 5.5* 5.6* 5.9*  ALBUMIN 3.1* 3.4* 2.6* 2.5* 2.7*    CBG: Recent Labs  Lab 07/01/23 2000  GLUCAP 143*     Recent  Results (from the past 240 hours)  Resp panel by RT-PCR (RSV, Flu Jaimere Feutz&B, Covid) Anterior Nasal Swab     Status: None   Collection Time: 06/29/23  6:00 PM   Specimen: Anterior Nasal Swab  Result Value Ref Range Status   SARS Coronavirus 2 by RT PCR NEGATIVE NEGATIVE Final    Comment: (NOTE) SARS-CoV-2 target nucleic acids are NOT DETECTED.  The SARS-CoV-2 RNA is generally detectable in upper respiratory specimens during the acute phase of infection. The lowest concentration of SARS-CoV-2 viral copies this assay can detect is 138 copies/mL. Rickeya Manus negative result does not preclude SARS-Cov-2 infection and should not be used as the sole basis for treatment or other patient management decisions. Launi Asencio negative result may occur with  improper specimen collection/handling,  submission of specimen other than nasopharyngeal swab, presence of viral mutation(s) within the areas targeted by this assay, and inadequate number of viral copies(<138 copies/mL). Shatima Zalar negative result must be combined with clinical observations, patient history, and epidemiological information. The expected result is Negative.  Fact Sheet for Patients:  BloggerCourse.com  Fact Sheet for Healthcare Providers:  SeriousBroker.it  This test is no t yet approved or cleared by the [place] FDA and  has been authorized for detection and/or diagnosis of SARS-CoV-2 by FDA under an Emergency Use Authorization (EUA). This EUA will remain  in effect (meaning this test can be used) for the duration of the COVID-19 declaration under Section 564(b)(1) of the Act, 21 U.S.C.section 360bbb-3(b)(1), unless the authorization is terminated  or revoked sooner.       Influenza Jawuan Robb by PCR NEGATIVE NEGATIVE Final   Influenza B by PCR NEGATIVE NEGATIVE Final    Comment: (NOTE) The Xpert Xpress SARS-CoV-2/FLU/RSV plus assay is intended as an aid in the diagnosis of influenza from Nasopharyngeal swab  specimens and should not be used as Yitzchak Kothari sole basis for treatment. Nasal washings and aspirates are unacceptable for Xpert Xpress SARS-CoV-2/FLU/RSV testing.  Fact Sheet for Patients: BloggerCourse.com  Fact Sheet for Healthcare Providers: SeriousBroker.it  This test is not yet approved or cleared by the [place] FDA and has been authorized for detection and/or diagnosis of SARS-CoV-2 by FDA under an Emergency Use Authorization (EUA). This EUA will remain in effect (meaning this test can be used) for the duration of the COVID-19 declaration under Section 564(b)(1) of the Act, 21 U.S.C. section 360bbb-3(b)(1), unless the authorization is terminated or revoked.     Resp Syncytial Virus by PCR NEGATIVE NEGATIVE Final    Comment: (NOTE) Fact Sheet for Patients: BloggerCourse.com  Fact Sheet for Healthcare Providers: SeriousBroker.it  This test is not yet approved or cleared by the [place] FDA and has been authorized for detection and/or diagnosis of SARS-CoV-2 by FDA under an Emergency Use Authorization (EUA). This EUA will remain in effect (meaning this test can be used) for the duration of the COVID-19 declaration under Section 564(b)(1) of the Act, 21 U.S.C. section 360bbb-3(b)(1), unless the authorization is terminated or revoked.  Performed at [facility], [address] [place], [place] [address]   MRSA Next Gen by PCR, Nasal     Status: None   Collection Time: 06/30/23  1:40 AM   Specimen: Urine, Clean Catch; Nasal Swab  Result Value Ref Range Status   MRSA by PCR Next Gen NOT DETECTED NOT DETECTED Final    Comment: (NOTE) The GeneXpert MRSA Assay (FDA approved for NASAL specimens only), is one component of Cloey Sferrazza comprehensive MRSA colonization surveillance program. It is not intended to diagnose MRSA infection nor to guide or monitor treatment  for MRSA infections. Test performance is not FDA approved in patients less than 38 years old. Performed at [facility], [address] [place], [place] [address]   Respiratory (~20 pathogens) panel by PCR     Status: None   Collection Time: 06/30/23  9:00 AM   Specimen: Nasopharyngeal Swab; Respiratory  Result Value Ref Range Status   Adenovirus NOT DETECTED NOT DETECTED Final   Coronavirus 229E NOT DETECTED NOT DETECTED Final    Comment: (NOTE) The Coronavirus on the Respiratory Panel, DOES NOT test for the novel  Coronavirus (2019 nCoV)    Coronavirus HKU1 NOT DETECTED NOT DETECTED Final   Coronavirus NL63 NOT DETECTED NOT DETECTED  Final   Coronavirus OC43 NOT DETECTED NOT DETECTED Final   Metapneumovirus NOT DETECTED NOT DETECTED Final   Rhinovirus / Enterovirus NOT DETECTED NOT DETECTED Final   Influenza Giulian Goldring NOT DETECTED NOT DETECTED Final   Influenza B NOT DETECTED NOT DETECTED Final   Parainfluenza Virus 1 NOT DETECTED NOT DETECTED Final   Parainfluenza Virus 2 NOT DETECTED NOT DETECTED Final   Parainfluenza Virus 3 NOT DETECTED NOT DETECTED Final   Parainfluenza Virus 4 NOT DETECTED NOT DETECTED Final   Respiratory Syncytial Virus NOT DETECTED NOT DETECTED Final   Bordetella pertussis NOT DETECTED NOT DETECTED Final   Bordetella Parapertussis NOT DETECTED NOT DETECTED Final   Chlamydophila pneumoniae NOT DETECTED NOT DETECTED Final   Mycoplasma pneumoniae NOT DETECTED NOT DETECTED Final    Comment: Performed at [facility], [address] [place], [place] [address]  Expectorated Sputum Assessment w Gram Stain, Rflx to Resp Cult     Status: None   Collection Time: 07/01/23 10:19 AM   Specimen: Expectorated Sputum  Result Value Ref Range Status   Specimen Description EXPECTORATED SPUTUM  Final   Special Requests NONE  Final   Sputum evaluation   Final    Sputum specimen not acceptable for testing.  Please recollect.   Informed B. Marvis Moeller, RN on  07/01/2023 at 1051 by SL Performed at [facility], [address] [place], [place] [address]    Report Status 07/01/2023 FINAL  Final  Expectorated Sputum Assessment w Gram Stain, Rflx to Resp Cult     Status: None   Collection Time: 07/01/23  3:59 PM   Specimen: Expectorated Sputum  Result Value Ref Range Status   Specimen Description EXPECTORATED SPUTUM  Final   Special Requests NONE  Final   Sputum evaluation   Final    THIS SPECIMEN IS ACCEPTABLE FOR SPUTUM CULTURE Performed at [facility], [address] [place], [place] [address]    Report Status 07/01/2023 FINAL  Final  Culture, Respiratory w Gram Stain     Status: None (Preliminary result)   Collection Time: 07/01/23  3:59 PM  Result Value Ref Range Status   Specimen Description   Final    EXPECTORATED SPUTUM Performed at [facility], [address] [place], [place] [address]    Special Requests   Final    NONE Reflexed from [id number] Performed at [facility], [address] [place], [place] [address]    Gram Stain   Final    RARE WBC PRESENT, PREDOMINANTLY PMN NO ORGANISMS SEEN    Culture   Final    TOO YOUNG TO READ Performed at [facility], [address], [place], [place] [address]    Report Status PENDING  Incomplete         Radiology Studies: No results found.       Scheduled Meds:  Chlorhexidine Gluconate Cloth  6 each Topical Daily   enoxaparin (LOVENOX) injection  40 mg Subcutaneous Q24H   ferrous sulfate  325 mg Oral Daily   OXcarbazepine  150 mg Oral BID   polyethylene glycol  17 g Oral BID   valACYclovir  1,000 mg Oral Daily   Continuous Infusions:  azithromycin Stopped (07/02/23 1809)   cefTRIAXone (ROCEPHIN)  IV Stopped (07/02/23 1637)     LOS: 4 days    Time spent: over 30 min    Lacretia Nicks, MD [facility]   To contact the attending provider  between 7A-7P or the covering  provider during after hours 7P-7A, please log into the web site [email or web address] and access using universal [place] password for that web site. If you do not have the password, please call the hospital operator.  07/03/2023, 10:27 AM

## 2023-07-04 DIAGNOSIS — J189 Pneumonia, unspecified organism: Secondary | ICD-10-CM | POA: Diagnosis not present

## 2023-07-04 DIAGNOSIS — I2729 Other secondary pulmonary hypertension: Secondary | ICD-10-CM | POA: Diagnosis not present

## 2023-07-04 DIAGNOSIS — A419 Sepsis, unspecified organism: Secondary | ICD-10-CM | POA: Diagnosis not present

## 2023-07-04 DIAGNOSIS — I10 Essential (primary) hypertension: Secondary | ICD-10-CM | POA: Diagnosis not present

## 2023-07-04 DIAGNOSIS — J9601 Acute respiratory failure with hypoxia: Secondary | ICD-10-CM | POA: Diagnosis not present

## 2023-07-04 DIAGNOSIS — R6520 Severe sepsis without septic shock: Secondary | ICD-10-CM | POA: Diagnosis not present

## 2023-07-04 LAB — CBC WITH DIFFERENTIAL/PLATELET
Abs Immature Granulocytes: 0.06 10*3/uL (ref 0.00–0.07)
Basophils Absolute: 0.1 10*3/uL (ref 0.0–0.1)
Basophils Relative: 1 %
Eosinophils Absolute: 0.5 10*3/uL (ref 0.0–0.5)
Eosinophils Relative: 5 %
HCT: 35 % — ABNORMAL LOW (ref 36.0–46.0)
Hemoglobin: 11.3 g/dL — ABNORMAL LOW (ref 12.0–15.0)
Immature Granulocytes: 1 %
Lymphocytes Relative: 13 %
Lymphs Abs: 1.5 10*3/uL (ref 0.7–4.0)
MCH: 33.4 pg (ref 26.0–34.0)
MCHC: 32.3 g/dL (ref 30.0–36.0)
MCV: 103.6 fL — ABNORMAL HIGH (ref 80.0–100.0)
Monocytes Absolute: 1.5 10*3/uL — ABNORMAL HIGH (ref 0.1–1.0)
Monocytes Relative: 13 %
Neutro Abs: 7.9 10*3/uL — ABNORMAL HIGH (ref 1.7–7.7)
Neutrophils Relative %: 67 %
Platelets: 400 10*3/uL (ref 150–400)
RBC: 3.38 MIL/uL — ABNORMAL LOW (ref 3.87–5.11)
RDW: 12.2 % (ref 11.5–15.5)
WBC: 11.6 10*3/uL — ABNORMAL HIGH (ref 4.0–10.5)
nRBC: 0 % (ref 0.0–0.2)

## 2023-07-04 LAB — RHEUMATOID FACTOR: Rheumatoid fact SerPl-aCnc: 16.5 [IU]/mL — ABNORMAL HIGH (ref <14.0)

## 2023-07-04 LAB — COMPREHENSIVE METABOLIC PANEL
ALT: 29 U/L (ref 0–44)
AST: 35 U/L (ref 15–41)
Albumin: 2.5 g/dL — ABNORMAL LOW (ref 3.5–5.0)
Alkaline Phosphatase: 66 U/L (ref 38–126)
Anion gap: 11 (ref 5–15)
BUN: 11 mg/dL (ref 8–23)
CO2: 27 mmol/L (ref 22–32)
Calcium: 8.4 mg/dL — ABNORMAL LOW (ref 8.9–10.3)
Chloride: 95 mmol/L — ABNORMAL LOW (ref 98–111)
Creatinine, Ser: 0.57 mg/dL (ref 0.44–1.00)
GFR, Estimated: >60 mL/min (ref >60)
Glucose, Bld: 107 mg/dL — ABNORMAL HIGH (ref 70–99)
Potassium: 3.8 mmol/L (ref 3.5–5.1)
Sodium: 133 mmol/L — ABNORMAL LOW (ref 135–145)
Total Bilirubin: 0.7 mg/dL (ref 0.0–1.2)
Total Protein: 5.5 g/dL — ABNORMAL LOW (ref 6.5–8.1)

## 2023-07-04 LAB — MAGNESIUM: Magnesium: 1.9 mg/dL (ref 1.7–2.4)

## 2023-07-04 LAB — ANA: Anti Nuclear Antibody (ANA): NEGATIVE

## 2023-07-04 LAB — PHOSPHORUS: Phosphorus: 3.7 mg/dL (ref 2.5–4.6)

## 2023-07-04 MED ORDER — CEFUROXIME AXETIL 500 MG PO TABS
500.0000 mg | ORAL_TABLET | Freq: Two times a day (BID) | ORAL | Status: AC
  Administered 2023-07-04 – 2023-07-05 (×4): 500 mg via ORAL
  Filled 2023-07-04 (×4): qty 1

## 2023-07-04 MED ORDER — PREDNISONE 5 MG PO TABS
30.0000 mg | ORAL_TABLET | Freq: Every day | ORAL | Status: DC
  Administered 2023-07-04 – 2023-07-06 (×3): 30 mg via ORAL
  Filled 2023-07-04 (×3): qty 2
  Filled 2023-07-04 (×2): qty 1

## 2023-07-04 NOTE — TOC Initial Note (Signed)
 Transition of Care [facility]) - Initial/Assessment Note    Patient Details  Name: Carolyn Mckinney MRN: [id number] Date of Birth: [birth year]  Transition of Care [facility]) CM/SW Contact:    Amada Jupiter, LCSW Phone Number: 07/04/2023, 2:41 PM  Clinical Narrative:                  Met with pt today to introduce TOC/ CSW role and begin reviewing possible dc needs.  Pt very pleasant and reports feeling much better today.  Pt confirms that she lives alone and daughter, Vernona Rieger, is living in [facility] and supportive.  Pt completely independent prior to this hospitalization.  Explained to pt that therapy has recommended possible CIR when medically improved.  Answered pt's questions of SNF rehab vs CIR rehab.  She is agreeable to either if this is recommended.  At this point, CSW will continue to follow along and assist with dc needs.  Expected Discharge Plan:  (TBD) Barriers to Discharge: Continued Medical Work up   Patient Goals and CMS Choice Patient states their goals for this hospitalization and ongoing recovery are:: return home          Expected Discharge Plan and Services In-house Referral: Clinical Social Work     Living arrangements for the past 2 months: Single Family Home                                      Prior Living Arrangements/Services Living arrangements for the past 2 months: Single Family Home Lives with:: Self Patient language and need for interpreter reviewed:: Yes Do you feel safe going back to the place where you live?: Yes      Need for Family Participation in Patient Care: Yes (Comment) Care giver support system in place?: No (comment)   Criminal Activity/Legal Involvement Pertinent to Current Situation/Hospitalization: No - Comment as needed  Activities of Daily Living   ADL Screening (condition at time of admission) Independently performs ADLs?: Yes (appropriate for developmental age) Is the patient deaf or have difficulty hearing?: Yes Does the  patient have difficulty seeing, even when wearing glasses/contacts?: Yes Does the patient have difficulty concentrating, remembering, or making decisions?: Yes  Permission Sought/Granted Permission sought to share information with : Family Supports Permission granted to share information with : Yes, Verbal Permission Granted  Share Information with NAME: daughter, Heywood Iles @ [phone]           Emotional Assessment Appearance:: Appears stated age Attitude/Demeanor/Rapport: Gracious, Engaged Affect (typically observed): Accepting Orientation: : Oriented to Self, Oriented to Place, Oriented to  Time, Oriented to Situation Alcohol / Substance Use: Not Applicable Psych Involvement: No (comment)  Admission diagnosis:  Hypoxia [R09.02] Acute hypoxic respiratory failure (HCC) [J96.01] Patient Active Problem List   Diagnosis Date Noted   Severe sepsis (HCC) 06/30/2023   Leukocytosis 06/30/2023   Acute hyponatremia 06/30/2023   Elevated troponin 06/30/2023   Depression 06/30/2023   Hypoxia 06/30/2023   Acute hypoxic respiratory failure (HCC) 06/29/2023   Mixed incontinence urge and stress 02/14/2023   Paresthesia of both feet 03/24/2022   CAP (community acquired pneumonia) 09/07/2021   Sacroiliac joint dysfunction of left side 07/25/2021   Trigger ring finger of left hand 06/27/2021   Chronic right SI joint pain 06/27/2021   Facet hypertrophy of lumbar region 06/17/2020   Lumbar radiculopathy 06/10/2020   Primary osteoarthritis of left knee 05/12/2020   Weakness of both  legs 07/01/2019   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 [place] [address] Phone: [phone] Fax: [phone]     Social Drivers of Health (SDOH) Social History: SDOH  Screenings   Food Insecurity: No Food Insecurity (06/30/2023)  Housing: Low Risk  (06/30/2023)  Transportation Needs: No Transportation Needs (06/30/2023)  Utilities: Not At Risk (06/30/2023)  Alcohol Screen: Low Risk  (05/21/2023)  Depression (PHQ2-9): Low Risk  (03/29/2023)  Financial Resource Strain: Low Risk  (05/21/2023)  Physical Activity: Sufficiently Active (05/21/2023)  Recent Concern: Physical Activity - Insufficiently Active (03/24/2023)  Social Connections: Moderately Integrated (06/30/2023)  Stress: No Stress Concern Present (05/21/2023)  Tobacco Use: Low Risk  (06/30/2023)  Health Literacy: Adequate Health Literacy (03/29/2023)   SDOH Interventions:     Readmission Risk Interventions    07/04/2023    2:38 PM  Readmission Risk Prevention Plan  Transportation Screening Complete  PCP or Specialist Appt within 5-7 Days Complete  Home Care Screening Complete  Medication Review (RN CM) Complete

## 2023-07-04 NOTE — Plan of Care (Signed)
  Problem: Respiratory: Goal: Ability to maintain adequate ventilation will improve Outcome: Progressing   Problem: Education: Goal: Knowledge of General Education information will improve Description: Including pain rating scale, medication(s)/side effects and non-pharmacologic comfort measures Outcome: Progressing   Problem: Clinical Measurements: Goal: Cardiovascular complication will be avoided Outcome: Progressing   Problem: Nutrition: Goal: Adequate nutrition will be maintained Outcome: Progressing   Problem: Elimination: Goal: Will not experience complications related to urinary retention Outcome: Progressing   Problem: Pain Managment: Goal: General experience of comfort will improve and/or be controlled Outcome: Progressing   Problem: Safety: Goal: Ability to remain free from injury will improve Outcome: Progressing   Problem: Clinical Measurements: Goal: Respiratory complications will improve Outcome: Not Progressing   Problem: Activity: Goal: Risk for activity intolerance will decrease Outcome: Not Progressing   Problem: Coping: Goal: Level of anxiety will decrease Outcome: Not Progressing   Problem: Elimination: Goal: Will not experience complications related to bowel motility Outcome: Not Progressing

## 2023-07-04 NOTE — Progress Notes (Signed)

## 2023-07-04 NOTE — Progress Notes (Signed)
   NAME:  Carolyn Mckinney, MRN:  [id number], DOB:  [birth year], LOS: 5 ADMISSION DATE:  06/29/2023, CONSULTATION DATE:  07/03/2023 REFERRING MD:  Dr Elijah Birk, CHIEF COMPLAINT:  Pneumonia   History of Present Illness:  Patient admitted with shortness of breath With evidence notes congestion, cough increasing shortness of breath for about 2 weeks prior to coming to the hospital Diagnosed with a pneumonia started on antibiotics Still requiring significant amount of oxygen supplementation   Denies underlying lung disease  Pertinent  Medical History   Past Medical History:  Diagnosis Date   Allergy    Cancer (HCC)    Breast   Cataract    cataract repair bilaterally   Closed patellar sleeve fracture of right knee 05/2019   Hypertension    Meniere's disease    Personal history of radiation therapy 2001   Left Breast Cancer   Significant Hospital Events: Including procedures, antibiotic start and stop dates in addition to other pertinent events   CT chest 06/29/2023 reviewed-multifocal infiltrates   Antibiotics Azithromycin 3/7>> Ceftriaxone 3/7>>  Interim History / Subjective:  Still has some chest congestion Slept fair, no new specific concerns  Objective   Blood pressure (!) 120/58, pulse 74, temperature 98.2 F (36.8 C), temperature source Oral, resp. rate 20, height 5\' 1"  (1.549 m), weight 68 kg, SpO2 94%.        Intake/Output Summary (Last 24 hours) at 07/04/2023 5956 Last data filed at 07/03/2023 2135 Gross per 24 hour  Intake 552.99 ml  Output 2225 ml  Net -1672.01 ml   Filed Weights   07/02/23 0500 07/03/23 0500 07/04/23 0217  Weight: 69.5 kg 67.3 kg 68 kg    Examination: General: Elderly,, does not appear to be in distress  HENT: Moist oral mucosa Lungs: Bibasal rales Cardiovascular: S1-S2 appreciated Abdomen: Soft, bowel sounds appreciated Extremities: No clubbing, no edema Neuro: Alert and oriented x 3 GU:   I reviewed nursing notes, hospitalist notes,  last 24 h vitals and pain scores, last 48 h intake and output, last 24 h labs and trends, and last 24 h imaging results. Resolved Hospital Problem list     Assessment & Plan:  Multilobar pneumonia -Complete 7 days of antibiotics -Viral panel, COVID-negative  Acute hypoxemic respiratory failure -Continue oxygen supplementation -Continue to wean down oxygen supplementation as tolerated, was turned down to 6 L  Pulmonary hypertension -Will need further workup for pulmonary hypertension when more stable -Will need right heart catheterization to assess degree of hypertension and treatment options -Ensure euvolemia -Continue oxygen supplementation  Oxygen requirement is likely multifactorial with respiratory infection and underlying pulmonary hypertension  Did send lab panel out to rule out connective tissue disease interstitial lung disease  Hypertension History of neuropathy/paresthesias history of breast S/p radiation treatment  Follow labs  Virl Diamond, MD [facility] Pager: See Loretha Stapler

## 2023-07-04 NOTE — Plan of Care (Signed)
  Problem: Fluid Volume: Goal: Hemodynamic stability will improve Outcome: Progressing   Problem: Clinical Measurements: Goal: Diagnostic test results will improve Outcome: Progressing Goal: Signs and symptoms of infection will decrease Outcome: Progressing   Problem: Respiratory: Goal: Ability to maintain adequate ventilation will improve Outcome: Progressing   Problem: Education: Goal: Knowledge of General Education information will improve Description: Including pain rating scale, medication(s)/side effects and non-pharmacologic comfort measures Outcome: Progressing   Problem: Health Behavior/Discharge Planning: Goal: Ability to manage health-related needs will improve Outcome: Progressing   Problem: Clinical Measurements: Goal: Ability to maintain clinical measurements within normal limits will improve Outcome: Progressing Goal: Will remain free from infection Outcome: Progressing Goal: Diagnostic test results will improve Outcome: Progressing Goal: Respiratory complications will improve Outcome: Progressing Goal: Cardiovascular complication will be avoided Outcome: Progressing   Problem: Activity: Goal: Risk for activity intolerance will decrease Outcome: Progressing   Problem: Nutrition: Goal: Adequate nutrition will be maintained Outcome: Progressing   Problem: Coping: Goal: Level of anxiety will decrease Outcome: Progressing   Problem: Elimination: Goal: Will not experience complications related to bowel motility Outcome: Progressing Goal: Will not experience complications related to urinary retention Outcome: Progressing   Problem: Pain Managment: Goal: General experience of comfort will improve and/or be controlled Outcome: Progressing   Problem: Safety: Goal: Ability to remain free from injury will improve Outcome: Progressing

## 2023-07-04 NOTE — Hospital Course (Addendum)
 Carolyn Mckinney is a 78 yo female with PMH HTN, Meniere's disease, left breast cancer s/p radiation who presented with cough and worsening SOB for approx 2 weeks PTA.  CTA chest showed multifocal pneumonia. She was significantly hypoxic and required salter HFNC on admission. Pulmonology was also consulted.

## 2023-07-04 NOTE — Progress Notes (Signed)
 Progress Note    Carolyn Mckinney   ZOX:[id number]  DOB: 10-04-1945  DOA: 06/29/2023     5 PCP: Sharlene Dory, DO  Initial CC: SOB, cough  Hospital Course: Carolyn Mckinney is a 78 yo female with PMH HTN, Meniere's disease, left breast cancer s/p radiation who presented with cough and worsening SOB for approx 2 weeks PTA.  CTA chest showed multifocal pneumonia. She was significantly hypoxic and required salter HFNC on admission. Pulmonology was also consulted.   Interval History:  No evidence overnight.  Still remains on salter high flow this morning.  In general her cough and shortness of breath has improved some since admission.  Assessment and Plan:  Acute Hypoxic Respiratory Failure Community Acquired Pneumonia - Met criteria for sepsis on presentation with leukocytosis, hypoxia and pneumonia -Multifocal pneumonia noted on CT angio chest - Remains on [place]ter high flow - urine strep (negative) and urine legionella (negative) - Negative RVP.  Negative covid, flu, RSV -Pulmonology following as well, appreciate assistance - Given oxygen demand, may need to consider steroids  Elevated BNP Follow echo -> EF 55-60%, no RWMA, mild LVH, moderately elevated PASP.  Abnormal septal motion (not clear significance).   Abnormal septal motion can be followed outpatient Not clinically overloaded on exam Lasix to keep net negative to net even   Hypertension Hold losartan and amlodipine in setting of presentation with sepsis    Depression Cymbalta, trazodone   Paresthesias Trileptal, cymbalta   Left Breast Cancer s/p lumpectomy/radiation 2001   Trigger Finger Outpatient follow up   Constipation Bowel regimen     Old records reviewed in assessment of this patient  Antimicrobials: Azithromycin 06/29/2023 >> 07/03/2023 Rocephin 06/29/2023 >> 07/03/2023 Cefuroxime 07/04/2023 >> current  DVT prophylaxis:  enoxaparin (LOVENOX) injection 40 mg Start: 06/30/23 1000 SCDs Start:  06/29/23 2356   Code Status:   Code Status: Full Code  Mobility Assessment (Last 72 Hours)     Mobility Assessment     Row Name 07/03/23 1503 07/03/23 1135         What is the highest level of mobility based on the progressive mobility assessment? Level 5 (Walks with assist in room/hall) - Balance while stepping forward/back and can walk in room with assist - Complete Level 5 (Walks with assist in room/hall) - Balance while stepping forward/back and can walk in room with assist - Complete               Barriers to discharge: none Disposition Plan:  Home HH orders placed: n/a Status is: Inpt  Objective: Blood pressure 132/73, pulse 68, temperature (!) 97.5 F (36.4 C), temperature source Axillary, resp. rate 16, height 5\' 1"  (1.549 m), weight 68 kg, SpO2 97%.  Examination:  Physical Exam Constitutional:      Appearance: Normal appearance.  HENT:     Head: Normocephalic and atraumatic.     Mouth/Throat:     Mouth: Mucous membranes are moist.  Eyes:     Extraocular Movements: Extraocular movements intact.  Cardiovascular:     Rate and Rhythm: Normal rate and regular rhythm.  Pulmonary:     Effort: Pulmonary effort is normal. No respiratory distress.     Breath sounds: Rhonchi present. No wheezing.  Abdominal:     General: Bowel sounds are normal. There is no distension.     Palpations: Abdomen is soft.     Tenderness: There is no abdominal tenderness.  Musculoskeletal:        General: Normal range of motion.  Cervical back: Normal range of motion and neck supple.  Skin:    General: Skin is warm and dry.  Neurological:     General: No focal deficit present.     Mental Status: She is alert.  Psychiatric:        Mood and Affect: Mood normal.      Consultants:  Pulmonology   Procedures:    Data Reviewed: Results for orders placed or performed during the hospital encounter of 06/29/23 (from the past 24 hours)  CBC with Differential/Platelet     Status:  Abnormal   Collection Time: 07/04/23  2:59 AM  Result Value Ref Range   WBC 11.6 (H) 4.0 - 10.5 K/uL   RBC 3.38 (L) 3.87 - 5.11 MIL/uL   Hemoglobin 11.3 (L) 12.0 - 15.0 g/dL   HCT 16.1 (L) 09.6 - 04.5 %   MCV 103.6 (H) 80.0 - 100.0 fL   MCH 33.4 26.0 - 34.0 pg   MCHC 32.3 30.0 - 36.0 g/dL   RDW 40.9 81.1 - 91.4 %   Platelets 400 150 - 400 K/uL   nRBC 0.0 0.0 - 0.2 %   Neutrophils Relative % 67 %   Neutro Abs 7.9 (H) 1.7 - 7.7 K/uL   Lymphocytes Relative 13 %   Lymphs Abs 1.5 0.7 - 4.0 K/uL   Monocytes Relative 13 %   Monocytes Absolute 1.5 (H) 0.1 - 1.0 K/uL   Eosinophils Relative 5 %   Eosinophils Absolute 0.5 0.0 - 0.5 K/uL   Basophils Relative 1 %   Basophils Absolute 0.1 0.0 - 0.1 K/uL   Immature Granulocytes 1 %   Abs Immature Granulocytes 0.06 0.00 - 0.07 K/uL  Comprehensive metabolic panel     Status: Abnormal   Collection Time: 07/04/23  2:59 AM  Result Value Ref Range   Sodium 133 (L) 135 - 145 mmol/L   Potassium 3.8 3.5 - 5.1 mmol/L   Chloride 95 (L) 98 - 111 mmol/L   CO2 27 22 - 32 mmol/L   Glucose, Bld 107 (H) 70 - 99 mg/dL   BUN 11 8 - 23 mg/dL   Creatinine, Ser 7.82 0.44 - 1.00 mg/dL   Calcium 8.4 (L) 8.9 - 10.3 mg/dL   Total Protein 5.5 (L) 6.5 - 8.1 g/dL   Albumin 2.5 (L) 3.5 - 5.0 g/dL   AST 35 15 - 41 U/L   ALT 29 0 - 44 U/L   Alkaline Phosphatase 66 38 - 126 U/L   Total Bilirubin 0.7 0.0 - 1.2 mg/dL   GFR, Estimated >95 >62 mL/min   Anion gap 11 5 - 15  Magnesium     Status: None   Collection Time: 07/04/23  2:59 AM  Result Value Ref Range   Magnesium 1.9 1.7 - 2.4 mg/dL  Phosphorus     Status: None   Collection Time: 07/04/23  2:59 AM  Result Value Ref Range   Phosphorus 3.7 2.5 - 4.6 mg/dL    I have reviewed pertinent nursing notes, vitals, labs, and images as necessary. I have ordered labwork to follow up on as indicated.  I have reviewed the last notes from staff over past 24 hours. I have discussed patient's care plan and test results  with nursing staff, CM/SW, and other staff as appropriate.  Time spent: Greater than 50% of the 55 minute visit was spent in counseling/coordination of care for the patient as laid out in the A&P.   LOS: 5 days   Onalee Hua  Milas Schappell, MD [facility] 07/04/2023, 1:53 PM

## 2023-07-04 NOTE — Progress Notes (Signed)
  Inpatient Rehabilitation Admissions Coordinator   Spoke with patient and her daughter, Vernona Rieger by phone for rehab assessment. We discussed goals and expectations of a possible CIR admit. Vernona Rieger can take some days off from work when patient initially comes home from possible CIR. They will discuss tonight and I will discuss case with rehab MD. I will follow up tomorrow for their preference of rehab venue. Please call me with any questions.   Ottie Glazier, RN, MSN Rehab Admissions Coordinator [phone]

## 2023-07-05 ENCOUNTER — Ambulatory Visit: Payer: Medicare Other | Admitting: Family Medicine

## 2023-07-05 ENCOUNTER — Telehealth: Payer: Self-pay | Admitting: Pulmonary Disease

## 2023-07-05 DIAGNOSIS — J90 Pleural effusion, not elsewhere classified: Secondary | ICD-10-CM | POA: Diagnosis not present

## 2023-07-05 DIAGNOSIS — J9601 Acute respiratory failure with hypoxia: Secondary | ICD-10-CM | POA: Diagnosis not present

## 2023-07-05 DIAGNOSIS — J189 Pneumonia, unspecified organism: Secondary | ICD-10-CM | POA: Diagnosis not present

## 2023-07-05 DIAGNOSIS — I272 Pulmonary hypertension, unspecified: Secondary | ICD-10-CM

## 2023-07-05 DIAGNOSIS — R6520 Severe sepsis without septic shock: Secondary | ICD-10-CM | POA: Diagnosis not present

## 2023-07-05 DIAGNOSIS — A419 Sepsis, unspecified organism: Secondary | ICD-10-CM | POA: Diagnosis not present

## 2023-07-05 LAB — ANCA TITERS
Atypical P-ANCA titer: <1:20 {titer}
C-ANCA: <1:20 {titer}
P-ANCA: <1:20 {titer}

## 2023-07-05 MED ORDER — DULOXETINE HCL 60 MG PO CPEP
60.0000 mg | ORAL_CAPSULE | Freq: Two times a day (BID) | ORAL | Status: DC
  Administered 2023-07-05 – 2023-07-08 (×6): 60 mg via ORAL
  Filled 2023-07-05 (×6): qty 1
  Filled 2023-07-05: qty 2

## 2023-07-05 NOTE — Progress Notes (Signed)
 Progress Note    Carolyn Mckinney   AOZ:[id number]  DOB: 12-Dec-1945  DOA: 06/29/2023     6 PCP: Sharlene Dory, DO  Initial CC: SOB, cough  Hospital Course: Carolyn Mckinney is a 78 yo female with PMH HTN, Meniere's disease, left breast cancer s/p radiation who presented with cough and worsening SOB for approx 2 weeks PTA.  CTA chest showed multifocal pneumonia. She was significantly hypoxic and required salter HFNC on admission. Pulmonology was also consulted.   Interval History:  No events overnight.  Able to be weaned down to [place] since yesterday. Ambulated with PT/OT today too.  Now stable for CIR.   Assessment and Plan:  Acute Hypoxic Respiratory Failure Community Acquired Pneumonia - Met criteria for sepsis on presentation with leukocytosis, hypoxia and pneumonia -Multifocal pneumonia noted on CT angio chest - s/p salter HF; now on [place]; ambulated with 4L - urine strep (negative) and urine legionella (negative) - Negative RVP.  Negative covid, flu, RSV -Pulmonology following as well, appreciate assistance - finishing 7 day course abx  Pulmonary HTN - appreciate pulmonology recommendations - will refer to cardiology for outpatient RHC to further evaluate for pulm HTN  Elevated BNP Follow echo -> EF 55-60%, no RWMA, mild LVH, moderately elevated PASP.  Abnormal septal motion (not clear significance).   Abnormal septal motion can be followed outpatient Not clinically overloaded on exam - lasix as needed   Hypertension Hold losartan and amlodipine in setting of presentation with sepsis    Depression Cymbalta, trazodone   Paresthesias Trileptal, cymbalta   Left Breast Cancer s/p lumpectomy/radiation 2001   Trigger Finger Outpatient follow up   Constipation Bowel regimen     Old records reviewed in assessment of this patient  Antimicrobials: Azithromycin 06/29/2023 >> 07/03/2023 Rocephin 06/29/2023 >> 07/03/2023 Cefuroxime 07/04/2023 >> current  DVT  prophylaxis:  enoxaparin (LOVENOX) injection 40 mg Start: 06/30/23 1000 SCDs Start: 06/29/23 2356   Code Status:   Code Status: Full Code  Mobility Assessment (Last 72 Hours)     Mobility Assessment     Row Name 07/05/23 1224 07/05/23 1121 07/05/23 1000 07/03/23 1503 07/03/23 1135   What is the highest level of mobility based on the progressive mobility assessment? Level 5 (Walks with assist in room/hall) - Balance while stepping forward/back and can walk in room with assist - Complete Level 5 (Walks with assist in room/hall) - Balance while stepping forward/back and can walk in room with assist - Complete Level 5 (Walks with assist in room/hall) - Balance while stepping forward/back and can walk in room with assist - Complete Level 5 (Walks with assist in room/hall) - Balance while stepping forward/back and can walk in room with assist - Complete Level 5 (Walks with assist in room/hall) - Balance while stepping forward/back and can walk in room with assist - Complete            Barriers to discharge: none Disposition Plan:  CIR HH orders placed: n/a Status is: Inpt  Objective: Blood pressure 137/68, pulse 76, temperature 98.8 F (37.1 C), temperature source Oral, resp. rate 20, height 5\' 1"  (1.549 m), weight 65.9 kg, SpO2 97%.  Examination:  Physical Exam Constitutional:      Appearance: Normal appearance.  HENT:     Head: Normocephalic and atraumatic.     Mouth/Throat:     Mouth: Mucous membranes are moist.  Eyes:     Extraocular Movements: Extraocular movements intact.  Cardiovascular:     Rate and Rhythm: Normal  rate and regular rhythm.  Pulmonary:     Effort: Pulmonary effort is normal. No respiratory distress.     Breath sounds: Rhonchi present. No wheezing.  Abdominal:     General: Bowel sounds are normal. There is no distension.     Palpations: Abdomen is soft.     Tenderness: There is no abdominal tenderness.  Musculoskeletal:        General: Normal range of  motion.     Cervical back: Normal range of motion and neck supple.  Skin:    General: Skin is warm and dry.  Neurological:     General: No focal deficit present.     Mental Status: She is alert.  Psychiatric:        Mood and Affect: Mood normal.      Consultants:  Pulmonology   Procedures:    Data Reviewed: No results found for this or any previous visit (from the past 24 hours).   I have reviewed pertinent nursing notes, vitals, labs, and images as necessary. I have ordered labwork to follow up on as indicated.  I have reviewed the last notes from staff over past 24 hours. I have discussed patient's care plan and test results with nursing staff, CM/SW, and other staff as appropriate.  Time spent: Greater than 50% of the 55 minute visit was spent in counseling/coordination of care for the patient as laid out in the A&P.   LOS: 6 days   Lewie Chamber, MD [facility] 07/05/2023, 12:49 PM

## 2023-07-05 NOTE — Progress Notes (Signed)
 Inpatient Rehabilitation Admissions Coordinator   Discussed case with Dr Shearon Stalls and then spoke with patient by phone. Patient has progressed very well and ambulated 400 feet X2 at contact guard assist level. Has weaned to 1 liter [place] from 4 liters [place] this am. IF CIR bed available Friday, she likely would only be with [place] through the weekend and likely be functionally ready to discharge home on Monday with her daughter's assistance. I question if another 48 hrs at [facility] long vs 3 day stay at [facility] prior to discharge home. Patient to discuss with daughter and I will follow in the am with CIR bed availability as well as her preference.  Ottie Glazier, RN, MSN Rehab Admissions Coordinator [phone] 07/05/2023 5:12 PM

## 2023-07-05 NOTE — Progress Notes (Signed)
 Physical Therapy Treatment Patient Details Name: Carolyn Mckinney MRN: [id number] DOB: [birth year] Today's Date: 07/05/2023   History of Present Illness Carolyn Mckinney is a 78 y.o. female admitted to [facility] on 06/29/2023 with severe sepsis due to suspected community-acquired pneumonia with acute hypoxic respiratory failure. PHMx: essential hypertension, depression, left breast cancer in 2001 status post lumpectomy/radiation.    PT Comments  Patient demonstrates improved mobility, balance improved. Continues to require supplemental oxygen . See separate note.  Patient will benefit from intensive inpatient follow-up therapy, >3 hours/day to return to independence.    If plan is discharge home, recommend the following: A little help with walking and/or transfers;A little help with bathing/dressing/bathroom;Assistance with cooking/housework;Assist for transportation;Help with stairs or ramp for entrance   Can travel by private vehicle        Equipment Recommendations    RW   Recommendations for Other Services       Precautions / Restrictions Precautions Precautions: Fall Precaution/Restrictions Comments: monitor sats Restrictions Weight Bearing Restrictions Per Provider Order: No     Mobility  Bed Mobility         Supine to sit: Supervision          Transfers     Transfers: Sit to/from Stand Sit to Stand: Min assist           General transfer comment: steady support to stand up    Ambulation/Gait Ambulation/Gait assistance: Contact guard assist Gait Distance (Feet): 400 Feet (x 2) Assistive device: Rolling walker (2 wheels) Gait Pattern/deviations: Step-through pattern       General Gait Details: gait is slow, steady , requires  pelvis stabilization x 10' with no RW   Stairs             Wheelchair Mobility     Tilt Bed    Modified Rankin (Stroke Patients Only)       Balance Overall balance assessment: Needs assistance    Sitting balance-Leahy Scale: Good       Standing balance-Leahy Scale: Fair                              Hotel manager: No apparent difficulties  Cognition Arousal: Alert Behavior During Therapy: WFL for tasks assessed/performed   PT - Cognitive impairments: No family/caregiver present to determine baseline                       PT - Cognition Comments: at times  slow to respond to questions, and express, loses train of thought/task Following commands: Intact      Cueing Cueing Techniques: Verbal cues  Exercises      General Comments        Pertinent Vitals/Pain Pain Assessment Pain Assessment: No/denies pain    Home Living                          Prior Function            PT Goals (current goals can now be found in the care plan section) Progress towards PT goals: Progressing toward goals    Frequency    Min 2X/week      PT Plan      Co-evaluation              AM-PAC PT "6 Clicks" Mobility   Outcome Measure  Help needed turning from your back to your side  while in a flat bed without using bedrails?: None Help needed moving from lying on your back to sitting on the side of a flat bed without using bedrails?: None Help needed moving to and from a bed to a chair (including a wheelchair)?: A Little Help needed standing up from a chair using your arms (e.g., wheelchair or bedside chair)?: A Little Help needed to walk in hospital room?: A Little Help needed climbing 3-5 steps with a railing? : A Lot 6 Click Score: 19    End of Session Equipment Utilized During Treatment: Oxygen Activity Tolerance: Patient tolerated treatment well Patient left: in chair;with call bell/phone within reach;with chair alarm set Nurse Communication: Mobility status PT Visit Diagnosis: Unsteadiness on feet (R26.81);Difficulty in walking, not elsewhere classified (R26.2)     Time: 4098-1191 PT Time  Calculation (min) (ACUTE ONLY): 44 min  Charges:    $Gait Training: 23-37 mins $Self Care/Home Management: 8-22 PT General Charges $$ ACUTE PT VISIT: 1 Visit                     Blanchard Kelch PT Acute Rehabilitation Services Office [phone]     Rada Hay 07/05/2023, 11:22 AM

## 2023-07-05 NOTE — Telephone Encounter (Signed)
 Done

## 2023-07-05 NOTE — Progress Notes (Signed)
   NAME:  Carolyn Mckinney, MRN:  [id number], DOB:  [birth year], LOS: 6 ADMISSION DATE:  06/29/2023, CONSULTATION DATE:  07/03/2023 REFERRING MD:  Dr Elijah Birk, CHIEF COMPLAINT:  Pneumonia   History of Present Illness:  Patient admitted with shortness of breath With evidence notes congestion, cough increasing shortness of breath for about 2 weeks prior to coming to the hospital Diagnosed with a pneumonia started on antibiotics Still requiring significant amount of oxygen supplementation   Denies underlying lung disease  Pertinent  Medical History   Past Medical History:  Diagnosis Date   Allergy    Cancer (HCC)    Breast   Cataract    cataract repair bilaterally   Closed patellar sleeve fracture of right knee 05/2019   Hypertension    Meniere's disease    Personal history of radiation therapy 2001   Left Breast Cancer   Significant Hospital Events: Including procedures, antibiotic start and stop dates in addition to other pertinent events   CT chest 06/29/2023 reviewed-multifocal infiltrates   Antibiotics Azithromycin 3/7>> Ceftriaxone 3/7>>  Interim History / Subjective:  Feeling better Oxygen requirement appears improving Less chest discomfort  Objective   Blood pressure (!) 147/80, pulse 70, temperature 98.4 F (36.9 C), temperature source Oral, resp. rate 18, height 5\' 1"  (1.549 m), weight 65.9 kg, SpO2 94%.        Intake/Output Summary (Last 24 hours) at 07/05/2023 8295 Last data filed at 07/05/2023 0000 Gross per 24 hour  Intake 960 ml  Output 600 ml  Net 360 ml   Filed Weights   07/03/23 0500 07/04/23 0217 07/05/23 0136  Weight: 67.3 kg 68 kg 65.9 kg    Examination: General: Elderly, does not appear to be in distress  HENT: Moist oral mucosa Lungs: Bibasal rales Cardiovascular: S1-S2 appreciated Abdomen: Soft bowel sounds appreciated Extremities: No clubbing, no edema Neuro: Alert and oriented x 3 GU:   I reviewed nursing notes, hospitalist notes, last  24 h vitals and pain scores, last 48 h intake and output, last 24 h labs and trends, and last 24 h imaging results. Resolved Hospital Problem list     Assessment & Plan:   Multilobar pneumonia -Recommend to complete 7 days of antibiotics -Viral panel negative, COVID-negative  Acute hypoxemic respiratory failure -Continue oxygen supplementation -Oxygen requirement continues to improve -May be switched to nasal cannula  Pulmonary hypertension -Will need further workup for pulmonary hypertension when more stable -Right heart catheterization to assess degree of hypertension and treatment options -Continue oxygen supplementation -Referral to cardiology as outpatient for right heart catheterization  Oxygen requirement is likely multifactorial with respiratory infection and underlying pulmonary hypertension  Connective tissue disease Sed rate elevated, CRP elevated ANA negative, Hypersensitivity pneumonitis panel results pending ANCA results pending IgE pending  Hypertension History of neuropathy/paresthesias history of breast S/p radiation treatment  Continues to improve  -May need short-term rehab/physical therapy -Likely needs to be discharged home on oxygen -Will place request for a pulmonary follow-up in 4 to 6 weeks -Cardiology follow-up for pulmonary hypertension-needs right heart catheterization  -Will sign off  -Call as needed  Virl Diamond, MD [place] [facility] Pager: See Loretha Stapler

## 2023-07-05 NOTE — Progress Notes (Signed)
 Occupational Therapy Treatment Patient Details Name: Carolyn Mckinney MRN: [id number] DOB: [birth year] Today's Date: 07/05/2023   History of present illness Carolyn Mckinney is a 78 y.o. female admitted to [facility] on 06/29/2023 with severe sepsis due to suspected community-acquired pneumonia with acute hypoxic respiratory failure. PHMx: essential hypertension, depression, left breast cancer in 2001 status post lumpectomy/radiation.   OT comments  Pt seen to see how she was progressing with ADLs today. Pt is more at a CGA level today at RW level, is more sure up on her feet and requiring less O2 today over when she was seen 2 days ago. She will continue to benefit from acute OT with follow up from intensive inpatient follow-up therapy, >3 hours/day still recommended.       If plan is discharge home, recommend the following:  A little help with walking and/or transfers;A little help with bathing/dressing/bathroom   Equipment Recommendations  None recommended by OT       Precautions / Restrictions Precautions Precautions: Fall Recall of Precautions/Restrictions: Intact Precaution/Restrictions Comments: monitor sats Restrictions Weight Bearing Restrictions Per Provider Order: No       Mobility Bed Mobility Overal bed mobility: Needs Assistance Bed Mobility: Supine to Sit     Supine to sit: Supervision     General bed mobility comments: increased time and effort    Transfers Overall transfer level: Needs assistance Equipment used: 1 person hand held assist, Rolling walker (2 wheels) Transfers: Sit to/from Stand Sit to Stand: Contact guard assist                 Balance Overall balance assessment: Needs assistance Sitting-balance support: No upper extremity supported, Feet supported Sitting balance-Leahy Scale: Good     Standing balance support: No upper extremity supported Standing balance-Leahy Scale: Fair Standing balance comment: standing to rub hands  together for hand gel                           ADL either performed or assessed with clinical judgement   ADL Overall ADL's : Needs assistance/impaired     Grooming: Wash/dry hands;Set up;Supervision/safety;Standing               Lower Body Dressing: Contact guard assist;Sit to/from stand   Toilet Transfer: Minimal assistance;Ambulation Toilet Transfer Details (indicate cue type and reason): one person HHA with pt--no posterior lean today Toileting- Clothing Manipulation and Hygiene: Contact guard assist;Sit to/from stand              Extremity/Trunk Assessment Upper Extremity Assessment Upper Extremity Assessment: Overall WFL for tasks assessed            Vision Patient Visual Report: No change from baseline           Communication Communication Communication: No apparent difficulties   Cognition Arousal: Alert Behavior During Therapy: WFL for tasks assessed/performed Cognition: No family/caregiver present to determine baseline                               Following commands: Intact                 General Comments Sats on 3L at rest and when up to bathroom were as high as 100 and low as 93%.    Pertinent Vitals/ Pain       Pain Assessment Pain Assessment: No/denies pain  Frequency  Min 2X/week        Progress Toward Goals  OT Goals(current goals can now be found in the care plan section)  Progress towards OT goals: Progressing toward goals  Acute Rehab OT Goals Patient Stated Goal: to continue to feel better OT Goal Formulation: With patient Time For Goal Achievement: 07/17/23 Potential to Achieve Goals: Good         AM-PAC OT "6 Clicks" Daily Activity     Outcome Measure   Help from another person eating meals?: None Help from another person taking care of personal grooming?: A Little Help from another person toileting, which includes using toliet, bedpan, or urinal?: A Little Help from  another person bathing (including washing, rinsing, drying)?: A Little Help from another person to put on and taking off regular upper body clothing?: A Little Help from another person to put on and taking off regular lower body clothing?: A Little 6 Click Score: 19    End of Session Equipment Utilized During Treatment: Rolling walker (2 wheels);Oxygen  OT Visit Diagnosis: Unsteadiness on feet (R26.81);Other abnormalities of gait and mobility (R26.89);Muscle weakness (generalized) (M62.81);History of falling (Z91.81)   Activity Tolerance Patient tolerated treatment well   Patient Left  (ambulating with PT in hallway)   Nurse Communication Mobility status        Time: 1610-9604 OT Time Calculation (min): 31 min  Charges: OT General Charges $OT Visit: 1 Visit OT Treatments $Self Care/Home Management : 23-37 mins  Lindon Romp OT Acute Rehabilitation Services Office [phone]    Evette Georges 07/05/2023, 12:29 PM

## 2023-07-05 NOTE — Plan of Care (Signed)
  Problem: Fluid Volume: Goal: Hemodynamic stability will improve Outcome: Progressing   Problem: Respiratory: Goal: Ability to maintain adequate ventilation will improve Outcome: Progressing   Problem: Activity: Goal: Risk for activity intolerance will decrease Outcome: Progressing

## 2023-07-05 NOTE — Progress Notes (Signed)
 SATURATION QUALIFICATIONS: (This note is used to comply with regulatory documentation for home oxygen)  Patient Saturations on Room Air at Rest = 92%  Patient Saturations on Room Air while Ambulating = 83%  Patient Saturations on 4 Liters of oxygen while Ambulating = varied 88-95% Please briefly explain why patient needs home oxygen:to maintain oxyen saturation while performing  functional activities such as ambulation. Blanchard Kelch PT Acute Rehabilitation Services Office [phone]

## 2023-07-05 NOTE — Telephone Encounter (Signed)
 Needs follow-up with either Dr. Isaiah Serge or Marchelle Gearing  Interstitial lung disease Pulmonary hypertension  4 to 6 weeks  She is still inpatient 07/05/2023 but anticipate may be able to go home in the next few days

## 2023-07-06 DIAGNOSIS — R6520 Severe sepsis without septic shock: Secondary | ICD-10-CM | POA: Diagnosis not present

## 2023-07-06 DIAGNOSIS — J189 Pneumonia, unspecified organism: Secondary | ICD-10-CM | POA: Diagnosis not present

## 2023-07-06 DIAGNOSIS — A419 Sepsis, unspecified organism: Secondary | ICD-10-CM | POA: Diagnosis not present

## 2023-07-06 DIAGNOSIS — J9601 Acute respiratory failure with hypoxia: Secondary | ICD-10-CM | POA: Diagnosis not present

## 2023-07-06 LAB — HYPERSENSITIVITY PNEUMONITIS
A. Pullulans Abs: NEGATIVE
A.Fumigatus #1 Abs: NEGATIVE
Micropolyspora faeni, IgG: NEGATIVE
Pigeon Serum Abs: NEGATIVE
Thermoact. Saccharii: NEGATIVE
Thermoactinomyces vulgaris, IgG: NEGATIVE

## 2023-07-06 MED ORDER — TRAZODONE HCL 50 MG PO TABS
50.0000 mg | ORAL_TABLET | Freq: Once | ORAL | Status: AC
  Administered 2023-07-06: 50 mg via ORAL

## 2023-07-06 MED ORDER — IBUPROFEN 200 MG PO TABS
200.0000 mg | ORAL_TABLET | Freq: Once | ORAL | Status: AC
  Administered 2023-07-06: 200 mg via ORAL
  Filled 2023-07-06: qty 1

## 2023-07-06 NOTE — Progress Notes (Signed)
 Mobility Specialist - Progress Note  Pre-mobility: 75 bpm HR, 92% SpO2 ([place] 1L) During mobility: 84 bpm HR, 86% SpO2 ([place] 2L) Post-mobility: 74 bpm HR, 93% SPO2 ([place] 1L)   07/06/23 1148  Mobility  Activity Ambulated with assistance in hallway  Level of Assistance Contact guard assist, steadying assist  Assistive Device Front wheel walker  Distance Ambulated (ft) 500 ft  Range of Motion/Exercises Active  Activity Response Tolerated well  Mobility Referral Yes  Mobility visit 1 Mobility  Mobility Specialist Start Time (ACUTE ONLY) 1115  Mobility Specialist Stop Time (ACUTE ONLY) 1148  Mobility Specialist Time Calculation (min) (ACUTE ONLY) 33 min   Pt was found in bed and agreeable to ambulate. Had x1 brief standing rest break at ~234ft due to SPO2 decreasing to 86%. Able to increase >90% and maintained for remainder of session. At EOS returned to bed with all needs met. Call bell in reach and RN notified.   Billey Chang Mobility Specialist

## 2023-07-06 NOTE — Progress Notes (Signed)
 Progress Note    Carolyn Mckinney   VHQ:[id number]  DOB: 05-14-1945  DOA: 06/29/2023     7 PCP: Sharlene Dory, DO  Initial CC: SOB, cough  Hospital Course: Carolyn Mckinney is a 78 yo female with PMH HTN, Meniere's disease, left breast cancer s/p radiation who presented with cough and worsening SOB for approx 2 weeks PTA.  CTA chest showed multifocal pneumonia. She was significantly hypoxic and required salter HFNC on admission. Pulmonology was also consulted.   Interval History:  No events overnight. Has continued to wean on oxygen and strength also continues to improve.  Due to this, she no longer is anticipated to need CIR and admission has been canceled. Tentative plan is to continue working with PT and mobility for the next couple days with anticipation of sending home with home health.  Assessment and Plan:  Acute Hypoxic Respiratory Failure Community Acquired Pneumonia - Met criteria for sepsis on presentation with leukocytosis, hypoxia and pneumonia -Multifocal pneumonia noted on CT angio chest - s/p salter HF; now on [place] - urine strep (negative) and urine legionella (negative) - Negative RVP.  Negative covid, flu, RSV -Pulmonology following as well, appreciate assistance - finishing 7 day course abx  Pulmonary HTN - appreciate pulmonology recommendations - will refer to cardiology for outpatient RHC to further evaluate for pulm HTN  Physical deconditioning - Initially was planned for CIR but has continued to steadily improve especially over the past couple days - Now planning for probable discharge home with home health - Continue working with PT over the next couple days  Elevated BNP Follow echo -> EF 55-60%, no RWMA, mild LVH, moderately elevated PASP.  Abnormal septal motion (not clear significance).   Abnormal septal motion can be followed outpatient Not clinically overloaded on exam - lasix as needed   Hypertension Hold losartan and amlodipine in setting  of presentation with sepsis    Depression Cymbalta, trazodone   Paresthesias Trileptal, cymbalta   Left Breast Cancer s/p lumpectomy/radiation 2001   Trigger Finger Outpatient follow up   Constipation Bowel regimen     Old records reviewed in assessment of this patient  Antimicrobials: Azithromycin 06/29/2023 >> 07/03/2023 Rocephin 06/29/2023 >> 07/03/2023 Cefuroxime 07/04/2023 >> 3/14  DVT prophylaxis:  enoxaparin (LOVENOX) injection 40 mg Start: 06/30/23 1000 SCDs Start: 06/29/23 2356   Code Status:   Code Status: Full Code  Mobility Assessment (Last 72 Hours)     Mobility Assessment     Row Name 07/06/23 0909 07/06/23 0039 07/05/23 1224 07/05/23 1121 07/05/23 1000   Does patient have an order for bedrest or is patient medically unstable No - Continue assessment No - Continue assessment -- -- --   What is the highest level of mobility based on the progressive mobility assessment? Level 5 (Walks with assist in room/hall) - Balance while stepping forward/back and can walk in room with assist - Complete Level 5 (Walks with assist in room/hall) - Balance while stepping forward/back and can walk in room with assist - Complete Level 5 (Walks with assist in room/hall) - Balance while stepping forward/back and can walk in room with assist - Complete Level 5 (Walks with assist in room/hall) - Balance while stepping forward/back and can walk in room with assist - Complete Level 5 (Walks with assist in room/hall) - Balance while stepping forward/back and can walk in room with assist - Complete    Row Name 07/03/23 1503           What is  the highest level of mobility based on the progressive mobility assessment? Level 5 (Walks with assist in room/hall) - Balance while stepping forward/back and can walk in room with assist - Complete                Barriers to discharge: none Disposition Plan:  TBD; probably home with [facility] HH orders placed: n/a Status is: Inpt  Objective: Blood  pressure 121/83, pulse 69, temperature 97.9 F (36.6 C), temperature source Oral, resp. rate 16, height 5\' 1"  (1.549 m), weight 67.4 kg, SpO2 96%.  Examination:  Physical Exam Constitutional:      Appearance: Normal appearance.  HENT:     Head: Normocephalic and atraumatic.     Mouth/Throat:     Mouth: Mucous membranes are moist.  Eyes:     Extraocular Movements: Extraocular movements intact.  Cardiovascular:     Rate and Rhythm: Normal rate and regular rhythm.  Pulmonary:     Effort: Pulmonary effort is normal. No respiratory distress.     Breath sounds: Rhonchi present. No wheezing.  Abdominal:     General: Bowel sounds are normal. There is no distension.     Palpations: Abdomen is soft.     Tenderness: There is no abdominal tenderness.  Musculoskeletal:        General: Normal range of motion.     Cervical back: Normal range of motion and neck supple.  Skin:    General: Skin is warm and dry.  Neurological:     General: No focal deficit present.     Mental Status: She is alert.  Psychiatric:        Mood and Affect: Mood normal.      Consultants:  Pulmonology   Procedures:    Data Reviewed: No results found for this or any previous visit (from the past 24 hours).   I have reviewed pertinent nursing notes, vitals, labs, and images as necessary. I have ordered labwork to follow up on as indicated.  I have reviewed the last notes from staff over past 24 hours. I have discussed patient's care plan and test results with nursing staff, CM/SW, and other staff as appropriate.  Time spent: Greater than 50% of the 55 minute visit was spent in counseling/coordination of care for the patient as laid out in the A&P.   LOS: 7 days   Lewie Chamber, MD [facility] 07/06/2023, 1:00 PM

## 2023-07-06 NOTE — Progress Notes (Signed)
 Inpatient Rehabilitation Admissions Coordinator   We do not have a CIR bed to offer her for admit and anticipate that she can progress to go home over the next couple of days based on her progress with mobility over the past 48 hrs. I encouraged her to mobilize with therapy Or Nursing today. Acute team and TOC made aware. We will sign off at this time.  Ottie Glazier, RN, MSN Rehab Admissions Coordinator [phone] 07/06/2023 10:00 AM

## 2023-07-06 NOTE — TOC Progression Note (Signed)
 Transition of Care [facility]) - Progression Note    Patient Details  Name: Carolyn Mckinney MRN: [id number] Date of Birth: [birth year]  [facility]) CM/SW Contact  Kameron Blethen, Olegario Messier, RN Phone Number: 07/06/2023, 10:04 AM  Clinical Narrative:  Noted per CIR not appropriate. Will await PT recc to asst w/d/c needs. -11a spoke to dtr Vernona Rieger aware of PT to see-patient already has rw, & transport home.     Expected Discharge Plan:  (TBD) Barriers to Discharge: Continued Medical Work up  Expected Discharge Plan and Services In-house Referral: Clinical Social Work     Living arrangements for the past 2 months: Single Family Home                                       Social Determinants of Health (SDOH) Interventions SDOH Screenings   Food Insecurity: No Food Insecurity (06/30/2023)  Housing: Low Risk  (06/30/2023)  Transportation Needs: No Transportation Needs (06/30/2023)  Utilities: Not At Risk (06/30/2023)  Alcohol Screen: Low Risk  (05/21/2023)  Depression (PHQ2-9): Low Risk  (03/29/2023)  Financial Resource Strain: Low Risk  (05/21/2023)  Physical Activity: Sufficiently Active (05/21/2023)  Recent Concern: Physical Activity - Insufficiently Active (03/24/2023)  Social Connections: Moderately Integrated (06/30/2023)  Stress: No Stress Concern Present (05/21/2023)  Tobacco Use: Low Risk  (06/30/2023)  Health Literacy: Adequate Health Literacy (03/29/2023)    Readmission Risk Interventions    07/04/2023    2:38 PM  Readmission Risk Prevention Plan  Transportation Screening Complete  PCP or Specialist Appt within 5-7 Days Complete  Home Care Screening Complete  Medication Review (RN CM) Complete

## 2023-07-07 DIAGNOSIS — J189 Pneumonia, unspecified organism: Secondary | ICD-10-CM | POA: Diagnosis not present

## 2023-07-07 DIAGNOSIS — J9601 Acute respiratory failure with hypoxia: Secondary | ICD-10-CM | POA: Diagnosis not present

## 2023-07-07 LAB — IGE: IgE (Immunoglobulin E), Serum: 3 [IU]/mL — ABNORMAL LOW (ref 6–495)

## 2023-07-07 NOTE — Progress Notes (Signed)
 Physical Therapy Treatment Patient Details Name: Carolyn Mckinney MRN: [id number] DOB: 02/25/1946 Today's Date: 07/07/2023   History of Present Illness Carolyn Mckinney is a 78 y.o. female admitted to [facility] on 06/29/2023 with severe sepsis due to suspected community-acquired pneumonia with acute hypoxic respiratory failure. PHMx: essential hypertension, depression, left breast cancer in 2001 status post lumpectomy/radiation.    PT Comments  Pt reports feeling well today; amb ~ 300' without device and supervision for safety only, no LOB with gait or  higher level balance tasks; SpO2>95% on RA. Pt does not feel that she needs PT f/u post acute, feels she is returning to her baseline mobility. We discussed continuing to incr activity slowly at d/c, pt's goal is to be able to walk and take care of her dog; supportive dtr and grand-dtr present for PT session.   If plan is discharge home, recommend the following:     Can travel by private vehicle        Equipment Recommendations  None recommended by PT    Recommendations for Other Services       Precautions / Restrictions Precautions Precautions: Fall Recall of Precautions/Restrictions: Intact Precaution/Restrictions Comments: monitor sats     Mobility  Bed Mobility               General bed mobility comments: in recliner    Transfers Overall transfer level: Needs assistance Equipment used: None Transfers: Sit to/from Stand Sit to Stand: Supervision           General transfer comment: for safety    Ambulation/Gait Ambulation/Gait assistance: Supervision, Contact guard assist Gait Distance (Feet): 300 Feet Assistive device: None Gait Pattern/deviations: Step-through pattern, Wide base of support Gait velocity: decr     General Gait Details: initial CGA for safety; gait stability improved with distance; no overt LOB   Stairs Stairs:  (has no stairs at home)           Wheelchair Mobility      Tilt Bed    Modified Rankin (Stroke Patients Only)       Balance Overall balance assessment: Needs assistance Sitting-balance support: No upper extremity supported, Feet supported Sitting balance-Leahy Scale: Good     Standing balance support: No upper extremity supported Standing balance-Leahy Scale: Fair               High level balance activites: Direction changes, Turns, Head turns High Level Balance Comments: CGA to Supervision for safety; no LOB            Communication Communication Communication: No apparent difficulties  Cognition Arousal: Alert Behavior During Therapy: WFL for tasks assessed/performed                           PT - Cognition Comments: at times delayed processing,loses train of thought/task, occaisonal redirection Following commands: Intact      Cueing Cueing Techniques: Verbal cues  Exercises      General Comments        Pertinent Vitals/Pain Pain Assessment Pain Assessment: No/denies pain    Home Living                          Prior Function            PT Goals (current goals can now be found in the care plan section) Acute Rehab PT Goals Patient Stated Goal: go home to puppy PT Goal Formulation: With  patient Time For Goal Achievement: 07/17/23 Potential to Achieve Goals: Good Progress towards PT goals: Progressing toward goals    Frequency    Min 2X/week      PT Plan      Co-evaluation              AM-PAC PT "6 Clicks" Mobility   Outcome Measure  Help needed turning from your back to your side while in a flat bed without using bedrails?: None Help needed moving from lying on your back to sitting on the side of a flat bed without using bedrails?: None Help needed moving to and from a bed to a chair (including a wheelchair)?: None Help needed standing up from a chair using your arms (e.g., wheelchair or bedside chair)?: None Help needed to walk in hospital room?: None Help  needed climbing 3-5 steps with a railing? : A Little 6 Click Score: 23    End of Session Equipment Utilized During Treatment: Gait belt Activity Tolerance: Patient tolerated treatment well Patient left: in chair;with call bell/phone within reach;with family/visitor present (no alarm on arrival)   PT Visit Diagnosis: Unsteadiness on feet (R26.81);Difficulty in walking, not elsewhere classified (R26.2)     Time: 3664-4034 PT Time Calculation (min) (ACUTE ONLY): 23 min  Charges:    $Gait Training: 23-37 mins PT General Charges $$ ACUTE PT VISIT: 1 Visit                     Elayna Tobler, PT  Acute Rehab Dept [facility]) [phone]  07/07/2023    [facility] 07/07/2023, 4:05 PM

## 2023-07-07 NOTE — Progress Notes (Signed)
 Progress Note    Carolyn Mckinney   HQI:[id number]  DOB: 05-07-1945  DOA: 06/29/2023     8 PCP: Sharlene Dory, DO  Initial CC: SOB, cough  Hospital Course: Carolyn Mckinney is a 78 yo female with PMH HTN, Meniere's disease, left breast cancer s/p radiation who presented with cough and worsening SOB for approx 2 weeks PTA.  CTA chest showed multifocal pneumonia. She was significantly hypoxic and required salter HFNC on admission. Pulmonology was also consulted.   Interval History:  No events overnight. Ambulated with mobility yesterday with mild O2 desat to 86% and required some CGA still.  Tentative plan for d/c Sunday if continues to improve and pending further PT recommendations and repeat eval.   Assessment and Plan:  Acute Hypoxic Respiratory Failure Community Acquired Pneumonia - Met criteria for sepsis on presentation with leukocytosis, hypoxia and pneumonia -Multifocal pneumonia noted on CT angio chest - s/p salter HF; now on [place] - urine strep (negative) and urine legionella (negative) - Negative RVP.  Negative covid, flu, RSV -Pulmonology following as well, appreciate assistance - completed 7 day course abx - final O2 requirements TBD prior to d/c if any  Pulmonary HTN - appreciate pulmonology recommendations - will refer to cardiology for outpatient RHC to further evaluate for pulm HTN  Physical deconditioning - Initially was planned for CIR but has continued to steadily improve especially over the past couple days - Now planning for probable discharge home with home health - Continue working with PT; follow up final rec's   Elevated BNP Follow echo -> EF 55-60%, no RWMA, mild LVH, moderately elevated PASP.  Abnormal septal motion (not clear significance).   Abnormal septal motion can be followed outpatient Not clinically overloaded on exam - lasix as needed   Hypertension Hold losartan and amlodipine in setting of presentation with sepsis     Depression Cymbalta, trazodone   Paresthesias Trileptal, cymbalta   Left Breast Cancer s/p lumpectomy/radiation 2001   Trigger Finger Outpatient follow up   Constipation Bowel regimen     Old records reviewed in assessment of this patient  Antimicrobials: Azithromycin 06/29/2023 >> 07/03/2023 Rocephin 06/29/2023 >> 07/03/2023 Cefuroxime 07/04/2023 >> 3/14  DVT prophylaxis:  enoxaparin (LOVENOX) injection 40 mg Start: 06/30/23 1000 SCDs Start: 06/29/23 2356   Code Status:   Code Status: Full Code  Mobility Assessment (Last 72 Hours)     Mobility Assessment     Row Name 07/07/23 0947 07/07/23 0945 07/07/23 0822 07/06/23 2054 07/06/23 0909   Does patient have an order for bedrest or is patient medically unstable -- -- No - Continue assessment No - Continue assessment No - Continue assessment   What is the highest level of mobility based on the progressive mobility assessment? Level 5 (Walks with assist in room/hall) - Balance while stepping forward/back and can walk in room with assist - Complete Level 5 (Walks with assist in room/hall) - Balance while stepping forward/back and can walk in room with assist - Complete Level 5 (Walks with assist in room/hall) - Balance while stepping forward/back and can walk in room with assist - Complete Level 5 (Walks with assist in room/hall) - Balance while stepping forward/back and can walk in room with assist - Complete Level 5 (Walks with assist in room/hall) - Balance while stepping forward/back and can walk in room with assist - Complete    Row Name 07/06/23 0039 07/05/23 1224 07/05/23 1121 07/05/23 1000     Does patient have an order for bedrest  or is patient medically unstable No - Continue assessment -- -- --    What is the highest level of mobility based on the progressive mobility assessment? Level 5 (Walks with assist in room/hall) - Balance while stepping forward/back and can walk in room with assist - Complete Level 5 (Walks with  assist in room/hall) - Balance while stepping forward/back and can walk in room with assist - Complete Level 5 (Walks with assist in room/hall) - Balance while stepping forward/back and can walk in room with assist - Complete Level 5 (Walks with assist in room/hall) - Balance while stepping forward/back and can walk in room with assist - Complete             Barriers to discharge: none Disposition Plan:  TBD; probably home with [facility] HH orders placed: n/a Status is: Inpt  Objective: Blood pressure 114/67, pulse 66, temperature 97.7 F (36.5 C), temperature source Oral, resp. rate 16, height 5\' 1"  (1.549 m), weight 69.3 kg, SpO2 95%.  Examination:  Physical Exam Constitutional:      Appearance: Normal appearance.  HENT:     Head: Normocephalic and atraumatic.     Mouth/Throat:     Mouth: Mucous membranes are moist.  Eyes:     Extraocular Movements: Extraocular movements intact.  Cardiovascular:     Rate and Rhythm: Normal rate and regular rhythm.  Pulmonary:     Effort: Pulmonary effort is normal. No respiratory distress.     Breath sounds: Rhonchi present. No wheezing.  Abdominal:     General: Bowel sounds are normal. There is no distension.     Palpations: Abdomen is soft.     Tenderness: There is no abdominal tenderness.  Musculoskeletal:        General: Normal range of motion.     Cervical back: Normal range of motion and neck supple.  Skin:    General: Skin is warm and dry.  Neurological:     General: No focal deficit present.     Mental Status: She is alert.  Psychiatric:        Mood and Affect: Mood normal.      Consultants:  Pulmonology   Procedures:    Data Reviewed: No results found for this or any previous visit (from the past 24 hours).   I have reviewed pertinent nursing notes, vitals, labs, and images as necessary. I have ordered labwork to follow up on as indicated.  I have reviewed the last notes from staff over past 24 hours. I have discussed  patient's care plan and test results with nursing staff, CM/SW, and other staff as appropriate.  Time spent: Greater than 50% of the 55 minute visit was spent in counseling/coordination of care for the patient as laid out in the A&P.   LOS: 8 days   Lewie Chamber, MD [facility] 07/07/2023, 12:35 PM

## 2023-07-08 ENCOUNTER — Encounter: Payer: Self-pay | Admitting: Family Medicine

## 2023-07-08 DIAGNOSIS — E871 Hypo-osmolality and hyponatremia: Secondary | ICD-10-CM | POA: Diagnosis not present

## 2023-07-08 DIAGNOSIS — J189 Pneumonia, unspecified organism: Secondary | ICD-10-CM | POA: Diagnosis not present

## 2023-07-08 DIAGNOSIS — A419 Sepsis, unspecified organism: Secondary | ICD-10-CM | POA: Diagnosis not present

## 2023-07-08 DIAGNOSIS — J9601 Acute respiratory failure with hypoxia: Secondary | ICD-10-CM | POA: Diagnosis not present

## 2023-07-08 NOTE — Discharge Summary (Signed)
 Physician Discharge Summary   Carolyn Mckinney WUJ:[id number] DOB: 1945/07/07 DOA: 06/29/2023  PCP: Sharlene Dory, DO  Admit date: 06/29/2023 Discharge date:  07/08/2023  Admitted From: Home Disposition:  Home  Discharging physician: Lewie Chamber, MD Barriers to discharge: none  Recommendations at discharge: Referral placed to cardiology for pulmonary hypertension workup per pulmonology recommendations  Discharge Condition: stable CODE STATUS: Full  Diet recommendation:  Diet Orders (From admission, onward)     Start     Ordered   07/08/23 0000  Diet general        07/08/23 0854   06/29/23 2356  Diet regular Room service appropriate? Yes; Fluid consistency: Thin  Diet effective now       Question Answer Comment  Room service appropriate? Yes   Fluid consistency: Thin      06/29/23 2355            Hospital Course: Carolyn Mckinney is a 78 yo female with PMH HTN, Meniere's disease, left breast cancer s/p radiation who presented with cough and worsening SOB for approx 2 weeks PTA.  CTA chest showed multifocal pneumonia. She was significantly hypoxic and required salter HFNC on admission. Pulmonology was also consulted.   Assessment and Plan:  Acute Hypoxic Respiratory Failure Community Acquired Pneumonia - Met criteria for sepsis on presentation with leukocytosis, hypoxia and pneumonia -Multifocal pneumonia noted on CT angio chest - s/p salter HF; now on [place] - urine strep (negative) and urine legionella (negative) - Negative RVP.  Negative covid, flu, RSV -Pulmonology following as well, appreciate assistance - completed 7 day course abx - discharged on RA; no desat with ambulation - final O2 requirements TBD prior to d/c if any   Pulmonary HTN - appreciate pulmonology recommendations - will refer to cardiology for outpatient RHC to further evaluate for pulm HTN; referral placed at discharge   Physical deconditioning - Initially was planned for CIR but has  continued to steadily improve especially over the past couple days - Tremendous improvement during hospitalization.  Evaluated by PT prior to discharge with no home needs   Elevated BNP Follow echo -> EF 55-60%, no RWMA, mild LVH, moderately elevated PASP.  Abnormal septal motion (not clear significance).   Abnormal septal motion can be followed outpatient Not clinically overloaded on exam - lasix as needed   Hypertension -Home regimen resumed at discharge   Depression Cymbalta, trazodone   Paresthesias Trileptal, cymbalta   Left Breast Cancer s/p lumpectomy/radiation 2001   Trigger Finger Outpatient follow up   Constipation Bowel regimen     The patient's acute and chronic medical conditions were treated accordingly. On day of discharge, patient was felt deemed stable for discharge. Patient/family member advised to call PCP or come back to ER if needed.   Principal Diagnosis: CAP (community acquired pneumonia)  Discharge Diagnoses: Active Hospital Problems   Diagnosis Date Noted   CAP (community acquired pneumonia) 09/07/2021    Priority: 1.   Severe sepsis (HCC) 06/30/2023    Priority: 2.   Acute hypoxic respiratory failure (HCC) 06/29/2023    Priority: 2.   Leukocytosis 06/30/2023   Acute hyponatremia 06/30/2023   Elevated troponin 06/30/2023   Depression 06/30/2023   Essential hypertension 05/02/2018    Resolved Hospital Problems  No resolved problems to display.     Discharge Instructions     Ambulatory referral to Cardiology   Complete by: As directed    Diet general   Complete by: As directed    Increase activity slowly  Complete by: As directed       Allergies as of 07/08/2023   No Known Allergies      Medication List     TAKE these medications    amLODipine 5 MG tablet Commonly known as: NORVASC Take 1 tablet (5 mg total) by mouth daily.   CALCIUM + D3 PO Take 1 tablet by mouth daily.   DULoxetine 60 MG capsule Commonly known  as: CYMBALTA Take 1 capsule (60 mg total) by mouth 2 (two) times daily.   IRON PO Take 1 tablet by mouth daily.   losartan 100 MG tablet Commonly known as: COZAAR Take 1 tablet (100 mg total) by mouth daily.   meloxicam 7.5 MG tablet Commonly known as: MOBIC Take 1 tablet by mouth once daily   MULTIVITAMIN ADULT PO Take 1 tablet by mouth daily.   OXcarbazepine 150 MG tablet Commonly known as: TRILEPTAL Take 1 tablet by mouth twice daily   solifenacin 5 MG tablet Commonly known as: VESICARE Take 1 tablet (5 mg total) by mouth daily.   traZODone 50 MG tablet Commonly known as: DESYREL Take 1-2 tablets (50-100 mg total) by mouth at bedtime as needed. for sleep   valACYclovir 500 MG tablet Commonly known as: Valtrex Take 2 tablets (1,000 mg total) by mouth daily.   VITAMIN K PO Take 1 tablet by mouth daily.   zinc gluconate 50 MG tablet Take 50 mg by mouth daily.        No Known Allergies  Consultations:   Procedures:   Discharge Exam: BP (!) 146/79 (BP Location: Left Arm)   Pulse 70   Temp 98 F (36.7 C)   Resp 19   Ht 5\' 1"  (1.549 m)   Wt 67.9 kg   SpO2 93%   BMI 28.28 kg/m  Physical Exam Constitutional:      Appearance: Normal appearance.  HENT:     Head: Normocephalic and atraumatic.     Mouth/Throat:     Mouth: Mucous membranes are moist.  Eyes:     Extraocular Movements: Extraocular movements intact.  Cardiovascular:     Rate and Rhythm: Normal rate and regular rhythm.  Pulmonary:     Effort: Pulmonary effort is normal. No respiratory distress.     Breath sounds: No wheezing or rhonchi.  Abdominal:     General: Bowel sounds are normal. There is no distension.     Palpations: Abdomen is soft.     Tenderness: There is no abdominal tenderness.  Musculoskeletal:        General: Normal range of motion.     Cervical back: Normal range of motion and neck supple.  Skin:    General: Skin is warm and dry.  Neurological:     General: No  focal deficit present.     Mental Status: She is alert.  Psychiatric:        Mood and Affect: Mood normal.      The results of significant diagnostics from this hospitalization (including imaging, microbiology, ancillary and laboratory) are listed below for reference.   Microbiology: Recent Results (from the past 240 hours)  Resp panel by RT-PCR (RSV, Flu A&B, Covid) Anterior Nasal Swab     Status: None   Collection Time: 06/29/23  6:00 PM   Specimen: Anterior Nasal Swab  Result Value Ref Range Status   SARS Coronavirus 2 by RT PCR NEGATIVE NEGATIVE Final    Comment: (NOTE) SARS-CoV-2 target nucleic acids are NOT DETECTED.  The SARS-CoV-2 RNA  is generally detectable in upper respiratory specimens during the acute phase of infection. The lowest concentration of SARS-CoV-2 viral copies this assay can detect is 138 copies/mL. A negative result does not preclude SARS-Cov-2 infection and should not be used as the sole basis for treatment or other patient management decisions. A negative result may occur with  improper specimen collection/handling, submission of specimen other than nasopharyngeal swab, presence of viral mutation(s) within the areas targeted by this assay, and inadequate number of viral copies(<138 copies/mL). A negative result must be combined with clinical observations, patient history, and epidemiological information. The expected result is Negative.  Fact Sheet for Patients:  BloggerCourse.com  Fact Sheet for Healthcare Providers:  SeriousBroker.it  This test is no t yet approved or cleared by the [place] FDA and  has been authorized for detection and/or diagnosis of SARS-CoV-2 by FDA under an Emergency Use Authorization (EUA). This EUA will remain  in effect (meaning this test can be used) for the duration of the COVID-19 declaration under Section 564(b)(1) of the Act, 21 U.S.C.section 360bbb-3(b)(1),  unless the authorization is terminated  or revoked sooner.       Influenza A by PCR NEGATIVE NEGATIVE Final   Influenza B by PCR NEGATIVE NEGATIVE Final    Comment: (NOTE) The Xpert Xpress SARS-CoV-2/FLU/RSV plus assay is intended as an aid in the diagnosis of influenza from Nasopharyngeal swab specimens and should not be used as a sole basis for treatment. Nasal washings and aspirates are unacceptable for Xpert Xpress SARS-CoV-2/FLU/RSV testing.  Fact Sheet for Patients: BloggerCourse.com  Fact Sheet for Healthcare Providers: SeriousBroker.it  This test is not yet approved or cleared by the [place] FDA and has been authorized for detection and/or diagnosis of SARS-CoV-2 by FDA under an Emergency Use Authorization (EUA). This EUA will remain in effect (meaning this test can be used) for the duration of the COVID-19 declaration under Section 564(b)(1) of the Act, 21 U.S.C. section 360bbb-3(b)(1), unless the authorization is terminated or revoked.     Resp Syncytial Virus by PCR NEGATIVE NEGATIVE Final    Comment: (NOTE) Fact Sheet for Patients: BloggerCourse.com  Fact Sheet for Healthcare Providers: SeriousBroker.it  This test is not yet approved or cleared by the [place] FDA and has been authorized for detection and/or diagnosis of SARS-CoV-2 by FDA under an Emergency Use Authorization (EUA). This EUA will remain in effect (meaning this test can be used) for the duration of the COVID-19 declaration under Section 564(b)(1) of the Act, 21 U.S.C. section 360bbb-3(b)(1), unless the authorization is terminated or revoked.  Performed at [facility], [address], [place], [place] [address]   MRSA Next Gen by PCR, Nasal     Status: None   Collection Time: 06/30/23  1:40 AM   Specimen: Urine, Clean Catch; Nasal Swab  Result Value Ref Range  Status   MRSA by PCR Next Gen NOT DETECTED NOT DETECTED Final    Comment: (NOTE) The GeneXpert MRSA Assay (FDA approved for NASAL specimens only), is one component of a comprehensive MRSA colonization surveillance program. It is not intended to diagnose MRSA infection nor to guide or monitor treatment for MRSA infections. Test performance is not FDA approved in patients less than 2 years old. Performed at [facility], [address], [place], [place] [address]   Respiratory (~20 pathogens) panel by PCR     Status: None   Collection Time: 06/30/23  9:00 AM   Specimen: Nasopharyngeal Swab; Respiratory  Result Value Ref Range Status   Adenovirus NOT DETECTED NOT DETECTED Final   Coronavirus 229E NOT DETECTED NOT DETECTED Final    Comment: (NOTE) The Coronavirus on the Respiratory Panel, DOES NOT test for the novel  Coronavirus (2019 nCoV)    Coronavirus HKU1 NOT DETECTED NOT DETECTED Final   Coronavirus NL63 NOT DETECTED NOT DETECTED Final   Coronavirus OC43 NOT DETECTED NOT DETECTED Final   Metapneumovirus NOT DETECTED NOT DETECTED Final   Rhinovirus / Enterovirus NOT DETECTED NOT DETECTED Final   Influenza A NOT DETECTED NOT DETECTED Final   Influenza B NOT DETECTED NOT DETECTED Final   Parainfluenza Virus 1 NOT DETECTED NOT DETECTED Final   Parainfluenza Virus 2 NOT DETECTED NOT DETECTED Final   Parainfluenza Virus 3 NOT DETECTED NOT DETECTED Final   Parainfluenza Virus 4 NOT DETECTED NOT DETECTED Final   Respiratory Syncytial Virus NOT DETECTED NOT DETECTED Final   Bordetella pertussis NOT DETECTED NOT DETECTED Final   Bordetella Parapertussis NOT DETECTED NOT DETECTED Final   Chlamydophila pneumoniae NOT DETECTED NOT DETECTED Final   Mycoplasma pneumoniae NOT DETECTED NOT DETECTED Final    Comment: Performed at [facility], [address], [place], [place] [address]  Expectorated Sputum Assessment w Gram Stain, Rflx to Resp Cult     Status: None    Collection Time: 07/01/23 10:19 AM   Specimen: Expectorated Sputum  Result Value Ref Range Status   Specimen Description EXPECTORATED SPUTUM  Final   Special Requests NONE  Final   Sputum evaluation   Final    Sputum specimen not acceptable for testing.  Please recollect.   Informed B. Marvis Moeller, RN on 07/01/2023 at 1051 by SL Performed at [facility], [address] [place], [place] [address]    Report Status 07/01/2023 FINAL  Final  Expectorated Sputum Assessment w Gram Stain, Rflx to Resp Cult     Status: None   Collection Time: 07/01/23  3:59 PM   Specimen: Expectorated Sputum  Result Value Ref Range Status   Specimen Description EXPECTORATED SPUTUM  Final   Special Requests NONE  Final   Sputum evaluation   Final    THIS SPECIMEN IS ACCEPTABLE FOR SPUTUM CULTURE Performed at [facility], [address] [place], [place] [address]    Report Status 07/01/2023 FINAL  Final  Culture, Respiratory w Gram Stain     Status: None   Collection Time: 07/01/23  3:59 PM  Result Value Ref Range Status   Specimen Description   Final    EXPECTORATED SPUTUM Performed at [facility], [address], [place], [place] [address]    Special Requests   Final    NONE Reflexed from [id number] Performed at [facility], [address], [place], [place] [address]    Gram Stain   Final    RARE WBC PRESENT, PREDOMINANTLY PMN NO ORGANISMS SEEN    Culture   Final    RARE Normal respiratory flora-no Staph aureus or Pseudomonas seen Performed at [facility], [address], [place], [place] [address]    Report Status 07/03/2023 FINAL  Final     Labs: BNP (last 3 results) Recent Labs    06/29/23 2321 07/01/23 0332  BNP 339.7* 274.6*   Basic Metabolic Panel: Recent Labs  Lab 07/02/23 0241 07/03/23 0256 07/04/23 0259  NA 134* 133* 133*  K 4.3 4.4 3.8  CL 98 95* 95*  CO2 28 28 27  GLUCOSE 134* 114* 107*  BUN  17 14 11   CREATININE 0.60 0.62 0.57  CALCIUM 8.6* 8.8* 8.4*  MG 1.9 1.9 1.9  PHOS 3.0 3.2 3.7   Liver Function Tests: Recent Labs  Lab 07/02/23 0241 07/03/23 0256 07/04/23 0259  AST 26 28 35  ALT 17 20 29   ALKPHOS 67 67 66  BILITOT 0.6 0.7 0.7  PROT 5.6* 5.9* 5.5*  ALBUMIN 2.5* 2.7* 2.5*   No results for input(s): "LIPASE", "AMYLASE" in the last 168 hours. No results for input(s): "AMMONIA" in the last 168 hours. CBC: Recent Labs  Lab 07/02/23 0241 07/03/23 0256 07/04/23 0259  WBC 14.3* 12.2* 11.6*  NEUTROABS 10.5* 8.6* 7.9*  HGB 11.7* 11.8* 11.3*  HCT 35.6* 36.3 35.0*  MCV 101.7* 101.7* 103.6*  PLT 344 366 400   Cardiac Enzymes: No results for input(s): "CKTOTAL", "CKMB", "CKMBINDEX", "TROPONINI" in the last 168 hours. BNP: Invalid input(s): "POCBNP" CBG: Recent Labs  Lab 07/01/23 2000  GLUCAP 143*   D-Dimer No results for input(s): "DDIMER" in the last 72 hours. Hgb A1c No results for input(s): "HGBA1C" in the last 72 hours. Lipid Profile No results for input(s): "CHOL", "HDL", "LDLCALC", "TRIG", "CHOLHDL", "LDLDIRECT" in the last 72 hours. Thyroid function studies No results for input(s): "TSH", "T4TOTAL", "T3FREE", "THYROIDAB" in the last 72 hours.  Invalid input(s): "FREET3" Anemia work up No results for input(s): "VITAMINB12", "FOLATE", "FERRITIN", "TIBC", "IRON", "RETICCTPCT" in the last 72 hours. Urinalysis    Component Value Date/Time   COLORURINE STRAW (A) 06/30/2023 0140   APPEARANCEUR CLEAR 06/30/2023 0140   LABSPEC 1.014 06/30/2023 0140   PHURINE 6.0 06/30/2023 0140   GLUCOSEU NEGATIVE 06/30/2023 0140   HGBUR NEGATIVE 06/30/2023 0140   BILIRUBINUR NEGATIVE 06/30/2023 0140   KETONESUR NEGATIVE 06/30/2023 0140   PROTEINUR NEGATIVE 06/30/2023 0140   NITRITE NEGATIVE 06/30/2023 0140   LEUKOCYTESUR NEGATIVE 06/30/2023 0140   Sepsis Labs Recent Labs  Lab 07/02/23 0241 07/03/23 0256 07/04/23 0259  WBC 14.3* 12.2* 11.6*    Microbiology Recent Results (from the past 240 hours)  Resp panel by RT-PCR (RSV, Flu A&B, Covid) Anterior Nasal Swab     Status: None   Collection Time: 06/29/23  6:00 PM   Specimen: Anterior Nasal Swab  Result Value Ref Range Status   SARS Coronavirus 2 by RT PCR NEGATIVE NEGATIVE Final    Comment: (NOTE) SARS-CoV-2 target nucleic acids are NOT DETECTED.  The SARS-CoV-2 RNA is generally detectable in upper respiratory specimens during the acute phase of infection. The lowest concentration of SARS-CoV-2 viral copies this assay can detect is 138 copies/mL. A negative result does not preclude SARS-Cov-2 infection and should not be used as the sole basis for treatment or other patient management decisions. A negative result may occur with  improper specimen collection/handling, submission of specimen other than nasopharyngeal swab, presence of viral mutation(s) within the areas targeted by this assay, and inadequate number of viral copies(<138 copies/mL). A negative result must be combined with clinical observations, patient history, and epidemiological information. The expected result is Negative.  Fact Sheet for Patients:  BloggerCourse.com  Fact Sheet for Healthcare Providers:  SeriousBroker.it  This test is no t yet approved or cleared by the [place] FDA and  has been authorized for detection and/or diagnosis of SARS-CoV-2 by FDA under an Emergency Use Authorization (EUA). This EUA will remain  in effect (meaning this test can be used) for the duration of the COVID-19 declaration under Section 564(b)(1) of the Act, 21  U.S.C.section 360bbb-3(b)(1), unless the authorization is terminated  or revoked sooner.       Influenza A by PCR NEGATIVE NEGATIVE Final   Influenza B by PCR NEGATIVE NEGATIVE Final    Comment: (NOTE) The Xpert Xpress SARS-CoV-2/FLU/RSV plus assay is intended as an aid in the diagnosis of influenza  from Nasopharyngeal swab specimens and should not be used as a sole basis for treatment. Nasal washings and aspirates are unacceptable for Xpert Xpress SARS-CoV-2/FLU/RSV testing.  Fact Sheet for Patients: BloggerCourse.com  Fact Sheet for Healthcare Providers: SeriousBroker.it  This test is not yet approved or cleared by the [place] FDA and has been authorized for detection and/or diagnosis of SARS-CoV-2 by FDA under an Emergency Use Authorization (EUA). This EUA will remain in effect (meaning this test can be used) for the duration of the COVID-19 declaration under Section 564(b)(1) of the Act, 21 U.S.C. section 360bbb-3(b)(1), unless the authorization is terminated or revoked.     Resp Syncytial Virus by PCR NEGATIVE NEGATIVE Final    Comment: (NOTE) Fact Sheet for Patients: BloggerCourse.com  Fact Sheet for Healthcare Providers: SeriousBroker.it  This test is not yet approved or cleared by the [place] FDA and has been authorized for detection and/or diagnosis of SARS-CoV-2 by FDA under an Emergency Use Authorization (EUA). This EUA will remain in effect (meaning this test can be used) for the duration of the COVID-19 declaration under Section 564(b)(1) of the Act, 21 U.S.C. section 360bbb-3(b)(1), unless the authorization is terminated or revoked.  Performed at [facility], [address], [place], [place] [address]   MRSA Next Gen by PCR, Nasal     Status: None   Collection Time: 06/30/23  1:40 AM   Specimen: Urine, Clean Catch; Nasal Swab  Result Value Ref Range Status   MRSA by PCR Next Gen NOT DETECTED NOT DETECTED Final    Comment: (NOTE) The GeneXpert MRSA Assay (FDA approved for NASAL specimens only), is one component of a comprehensive MRSA colonization surveillance program. It is not intended to diagnose MRSA infection nor to  guide or monitor treatment for MRSA infections. Test performance is not FDA approved in patients less than 6 years old. Performed at [facility], [address], [place], [place] [address]   Respiratory (~20 pathogens) panel by PCR     Status: None   Collection Time: 06/30/23  9:00 AM   Specimen: Nasopharyngeal Swab; Respiratory  Result Value Ref Range Status   Adenovirus NOT DETECTED NOT DETECTED Final   Coronavirus 229E NOT DETECTED NOT DETECTED Final    Comment: (NOTE) The Coronavirus on the Respiratory Panel, DOES NOT test for the novel  Coronavirus (2019 nCoV)    Coronavirus HKU1 NOT DETECTED NOT DETECTED Final   Coronavirus NL63 NOT DETECTED NOT DETECTED Final   Coronavirus OC43 NOT DETECTED NOT DETECTED Final   Metapneumovirus NOT DETECTED NOT DETECTED Final   Rhinovirus / Enterovirus NOT DETECTED NOT DETECTED Final   Influenza A NOT DETECTED NOT DETECTED Final   Influenza B NOT DETECTED NOT DETECTED Final   Parainfluenza Virus 1 NOT DETECTED NOT DETECTED Final   Parainfluenza Virus 2 NOT DETECTED NOT DETECTED Final   Parainfluenza Virus 3 NOT DETECTED NOT DETECTED Final   Parainfluenza Virus 4 NOT DETECTED NOT DETECTED Final   Respiratory Syncytial Virus NOT DETECTED NOT DETECTED Final   Bordetella pertussis NOT DETECTED NOT DETECTED Final   Bordetella Parapertussis NOT DETECTED NOT DETECTED Final   Chlamydophila pneumoniae NOT DETECTED  NOT DETECTED Final   Mycoplasma pneumoniae NOT DETECTED NOT DETECTED Final    Comment: Performed at [facility], [address] [place], [place] [address]  Expectorated Sputum Assessment w Gram Stain, Rflx to Resp Cult     Status: None   Collection Time: 07/01/23 10:19 AM   Specimen: Expectorated Sputum  Result Value Ref Range Status   Specimen Description EXPECTORATED SPUTUM  Final   Special Requests NONE  Final   Sputum evaluation   Final    Sputum specimen not acceptable for testing.  Please recollect.    Informed B. Marvis Moeller, RN on 07/01/2023 at 1051 by SL Performed at [facility], [address], [place], [place] [address]    Report Status 07/01/2023 FINAL  Final  Expectorated Sputum Assessment w Gram Stain, Rflx to Resp Cult     Status: None   Collection Time: 07/01/23  3:59 PM   Specimen: Expectorated Sputum  Result Value Ref Range Status   Specimen Description EXPECTORATED SPUTUM  Final   Special Requests NONE  Final   Sputum evaluation   Final    THIS SPECIMEN IS ACCEPTABLE FOR SPUTUM CULTURE Performed at [facility], [address] [place], [place] [address]    Report Status 07/01/2023 FINAL  Final  Culture, Respiratory w Gram Stain     Status: None   Collection Time: 07/01/23  3:59 PM  Result Value Ref Range Status   Specimen Description   Final    EXPECTORATED SPUTUM Performed at [facility], [address] [place], [place] [address]    Special Requests   Final    NONE Reflexed from [id number] Performed at [facility], [address] [place], [place] [address]    Gram Stain   Final    RARE WBC PRESENT, PREDOMINANTLY PMN NO ORGANISMS SEEN    Culture   Final    RARE Normal respiratory flora-no Staph aureus or Pseudomonas seen Performed at [facility], [address] [place], [place] [address]    Report Status 07/03/2023 FINAL  Final    Procedures/Studies: DG CHEST PORT 1 VIEW Result Date: 07/01/2023 CLINICAL DATA:  [id number] Hypoxia [id number] EXAM: PORTABLE CHEST 1 VIEW COMPARISON:  June 29, 2023 FINDINGS: The cardiomediastinal silhouette is unchanged and enlarged in contour. No significant pleural effusion. No pneumothorax. Background of diffuse coarse reticular and interstitial opacities, favored slightly increased in comparison to prior. IMPRESSION: Background of diffuse coarse reticular and interstitial opacities, favored slightly increased in comparison to prior. This could reflect atypical  infection or edema. Electronically Signed   By: Meda Klinefelter M.D.   On: 07/01/2023 12:25   ECHOCARDIOGRAM COMPLETE Result Date: 06/30/2023    ECHOCARDIOGRAM REPORT   Patient Name:   Carolyn Mckinney Date of Exam: 06/30/2023 Medical Rec #:  [id number]      Height:       61.0 in Accession #:    [id number]     Weight:       142.0 lb Date of Birth:  1945/12/13     BSA:          1.633 m Patient Age:    77 years       BP:           126/74 mmHg Patient Gender: F              HR:           76 bpm. Exam Location:  Inpatient Procedure: 2D Echo, Cardiac Doppler and Color Doppler (Both Spectral and Color            Flow Doppler were utilized during procedure). Indications:    R06.02 SOB. Elevated troponin.  History:        Patient has no prior history of Echocardiogram examinations.                 Signs/Symptoms:Bacteremia, Shortness of Breath, Chest Pain and                 Dyspnea; Risk Factors:Hypertension. Breast cancer. Pneumonia.  Sonographer:    Sheralyn Boatman RDCS Referring Phys: [id number] Angie Fava  Sonographer Comments: Suboptimal apical window. IMPRESSIONS  1. Abnormal septal motion. Left ventricular ejection fraction, by estimation, is 55 to 60%. The left ventricle has normal function. The left ventricle has no regional wall motion abnormalities. There is mild left ventricular hypertrophy. Left ventricular diastolic parameters were normal.  2. Right ventricular systolic function is normal. The right ventricular size is normal. There is moderately elevated pulmonary artery systolic pressure.  3. The mitral valve is normal in structure. Trivial mitral valve regurgitation. No evidence of mitral stenosis.  4. The aortic valve is tricuspid. Aortic valve regurgitation is not visualized. No aortic stenosis is present.  5. The inferior vena cava is normal in size with greater than 50% respiratory variability, suggesting right atrial pressure of 3 mmHg. FINDINGS  Left Ventricle: Abnormal septal motion. Left ventricular  ejection fraction, by estimation, is 55 to 60%. The left ventricle has normal function. The left ventricle has no regional wall motion abnormalities. Strain was performed and the global longitudinal strain is indeterminate. The left ventricular internal cavity size was normal in size. There is mild left ventricular hypertrophy. Left ventricular diastolic parameters were normal. Right Ventricle: The right ventricular size is normal. No increase in right ventricular wall thickness. Right ventricular systolic function is normal. There is moderately elevated pulmonary artery systolic pressure. The tricuspid regurgitant velocity is 2.90 m/s, and with an assumed right atrial pressure of 15 mmHg, the estimated right ventricular systolic pressure is 48.6 mmHg. Left Atrium: Left atrial size was normal in size. Right Atrium: Right atrial size was normal in size. Pericardium: There is no evidence of pericardial effusion. Mitral Valve: The mitral valve is normal in structure. Trivial mitral valve regurgitation. No evidence of mitral valve stenosis. Tricuspid Valve: The tricuspid valve is normal in structure. Tricuspid valve regurgitation is mild . No evidence of tricuspid stenosis. Aortic Valve: The aortic valve is tricuspid. Aortic valve regurgitation is not visualized. No aortic stenosis is present. Pulmonic Valve: The pulmonic valve was normal in structure. Pulmonic valve regurgitation is not visualized. No evidence of pulmonic stenosis. Aorta: The aortic root is normal in size and structure. Venous: The inferior vena cava is normal in size with greater than 50% respiratory variability, suggesting right atrial pressure of 3 mmHg. IAS/Shunts: No atrial level shunt detected by color flow Doppler. Additional Comments: 3D was performed not requiring image post processing on an independent workstation and was indeterminate.  LEFT VENTRICLE PLAX 2D LVIDd:         4.00 cm     Diastology LVIDs:         2.60 cm     LV e' medial:     7.40 cm/s LV PW:         1.20 cm     LV E/e' medial:  8.5 LV IVS:  1.10 cm     LV e' lateral:   10.30 cm/s LVOT diam:     2.20 cm     LV E/e' lateral: 6.1 LV SV:         105 LV SV Index:   64 LVOT Area:     3.80 cm  LV Volumes (MOD) LV vol d, MOD A2C: 40.9 ml LV vol d, MOD A4C: 46.0 ml LV vol s, MOD A2C: 11.7 ml LV vol s, MOD A4C: 16.8 ml LV SV MOD A2C:     29.2 ml LV SV MOD A4C:     46.0 ml LV SV MOD BP:      30.3 ml RIGHT VENTRICLE             IVC RV S prime:     18.00 cm/s  IVC diam: 1.00 cm TAPSE (M-mode): 2.1 cm LEFT ATRIUM             Index        RIGHT ATRIUM           Index LA diam:        2.10 cm 1.29 cm/m   RA Area:     12.80 cm LA Vol (A2C):   32.0 ml 19.60 ml/m  RA Volume:   30.80 ml  18.86 ml/m LA Vol (A4C):   22.7 ml 13.90 ml/m LA Biplane Vol: 26.7 ml 16.35 ml/m  AORTIC VALVE             PULMONIC VALVE LVOT Vmax:   143.00 cm/s PR End Diast Vel: 1.67 msec LVOT Vmean:  95.800 cm/s LVOT VTI:    0.275 m  AORTA Ao Root diam: 2.90 cm Ao Asc diam:  3.60 cm MITRAL VALVE               TRICUSPID VALVE MV Area (PHT): 3.42 cm    TR Peak grad:   33.6 mmHg MV Decel Time: 222 msec    TR Vmax:        290.00 cm/s MV E velocity: 63.00 cm/s MV A velocity: 71.60 cm/s  SHUNTS MV E/A ratio:  0.88        Systemic VTI:  0.28 m                            Systemic Diam: 2.20 cm Charlton Haws MD Electronically signed by Charlton Haws MD Signature Date/Time: 06/30/2023/1:19:56 PM    Final    CT Angio Chest PE W/Cm &/Or Wo Cm Result Date: 06/30/2023 CLINICAL DATA:  Hypoxia EXAM: CT ANGIOGRAPHY CHEST WITH CONTRAST TECHNIQUE: Multidetector CT imaging of the chest was performed using the standard protocol during bolus administration of intravenous contrast. Multiplanar CT image reconstructions and MIPs were obtained to evaluate the vascular anatomy. RADIATION DOSE REDUCTION: This exam was performed according to the departmental dose-optimization program which includes automated exposure control, adjustment of the mA  and/or kV according to patient size and/or use of iterative reconstruction technique. CONTRAST:  75mL OMNIPAQUE IOHEXOL 350 MG/ML SOLN COMPARISON:  Chest x-ray 06/29/2023, CT chest 09/07/2021 FINDINGS: Cardiovascular: Satisfactory opacification of the pulmonary arteries to the segmental level. No evidence of pulmonary embolism. Mild cardiomegaly. No sizable pericardial effusion. Mild atherosclerosis. No aneurysm. Dilated azygous vein. Mediastinum/Nodes: Patent trachea. No thyroid mass. Multiple enlarged mediastinal lymph nodes. Prevascular lymph nodes measuring up to 9 mm. Right paratracheal nodes measuring up to 13 mm. Left paratracheal nodes measuring up to 14 mm. Subcarinal lymph  nodes measuring up to 17 mm. Ill-defined right greater than left hilar/peribronchovascular soft tissue density without a discrete mass. Esophagus unremarkable. Lungs/Pleura: Partial lower lobe consolidations. Widespread heterogeneous ground-glass densities and consolidations throughout the bilateral lungs. No significant pleural effusion. Prominent peribronchovascular soft tissue thickening. 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 No acute osseous abnormality. Probable Schmorl's node and mild superior endplate deformity at T12. Review of the MIP images confirms the above findings. IMPRESSION: 1. Negative for acute pulmonary embolus. 2. Widespread bilateral heterogeneous consolidations and ground-glass disease with partial lower lobe consolidations suspicious for multifocal infection. Diffuse bilateral peribronchovascular thickening right greater than left which may be inflammatory or infectious. Suspicion of mild right hilar adenopathy and multiple mildly enlarged mediastinal lymph nodes potentially reactive but attention on CT follow-up. 3. 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 Cardiomegaly Aortic Atherosclerosis (ICD10-I70.0).  Electronically Signed   By: Jasmine Pang M.D.   On: 06/30/2023 00:25   DG Chest Portable 1 View Result Date: 06/29/2023 CLINICAL DATA:  hypoxia, cough EXAM: PORTABLE CHEST 1 VIEW COMPARISON:  09/07/2021 chest radiograph. FINDINGS: Thickening of the right paratracheal stripe with asymmetric prominence of the right hilum, increased. Stable mild cardiomegaly. No pneumothorax or pleural effusion. Streaky bibasilar lung opacities, similar to prior radiograph. IMPRESSION: 1. Thickening of the right paratracheal stripe with asymmetric prominence of the right hilum, increased. Chest CT with IV contrast recommended for further evaluation to exclude underlying adenopathy or mass. 2. Stable mild cardiomegaly. 3. Streaky bibasilar lung opacities, similar to prior radiograph, favor scarring or atelectasis. Electronically Signed   By: Delbert Phenix M.D.   On: 06/29/2023 21:27     Time coordinating discharge: Over 30 minutes    Lewie Chamber, MD  [facility] 07/08/2023, 11:13 AM

## 2023-07-08 NOTE — TOC Transition Note (Signed)
 Transition of Care [facility]) - Discharge Note   Patient Details  Name: Carolyn Mckinney MRN: [id number] Date of Birth: November 18, 1945  Transition of [facility]) CM/SW Contact:  Adrian Prows, RN Phone Number: 07/08/2023, 9:01 AM   Clinical Narrative:    D/C orders received; no TOC needs.   Final next level of care: Home/Self Care Barriers to Discharge: No Barriers Identified   Patient Goals and CMS Choice Patient states their goals for this hospitalization and ongoing recovery are:: Home   Choice offered to / list presented to : Patient [place] ownership interest in [facility].provided to:: Patient    Discharge Placement                       Discharge Plan and Services Additional resources added to the After Visit Summary for   In-house Referral: Clinical Social Work                                   Social Drivers of Health (SDOH) Interventions SDOH Screenings   Food Insecurity: No Food Insecurity (06/30/2023)  Housing: Low Risk  (06/30/2023)  Transportation Needs: No Transportation Needs (06/30/2023)  Utilities: Not At Risk (06/30/2023)  Alcohol Screen: Low Risk  (05/21/2023)  Depression (PHQ2-9): Low Risk  (03/29/2023)  Financial Resource Strain: Low Risk  (05/21/2023)  Physical Activity: Sufficiently Active (05/21/2023)  Recent Concern: Physical Activity - Insufficiently Active (03/24/2023)  Social Connections: Moderately Integrated (06/30/2023)  Stress: No Stress Concern Present (05/21/2023)  Tobacco Use: Low Risk  (06/30/2023)  Health Literacy: Adequate Health Literacy (03/29/2023)     Readmission Risk Interventions    07/04/2023    2:38 PM  Readmission Risk Prevention Plan  Transportation Screening Complete  PCP or Specialist Appt within 5-7 Days Complete  Home Care Screening Complete  Medication Review (RN CM) Complete

## 2023-07-08 NOTE — Progress Notes (Signed)
 IV and telemetry removed AVS reviewed with teach back with pt  Pt dressed with belongings awaiting on the ride.

## 2023-07-09 ENCOUNTER — Telehealth: Payer: Self-pay | Admitting: *Deleted

## 2023-07-09 LAB — CYCLIC CITRUL PEPTIDE ANTIBODY, IGG/IGA: CCP Antibodies IgG/IgA: 10 U (ref 0–19)

## 2023-07-09 NOTE — Transitions of Care (Post Inpatient/ED Visit) (Signed)
   07/09/2023  Name: Venissa Nappi MRN: [id number] DOB: May 03, 1945  Today's TOC FU Call Status: Today's TOC FU Call Status:: Unsuccessful Call (1st Attempt) Unsuccessful Call (1st Attempt) Date: 07/09/23  Attempted to reach the patient regarding the most recent Inpatient/ED visit.  Follow Up Plan: Additional outreach attempts will be made to reach the patient to complete the Transitions of Care (Post Inpatient/ED visit) call.   Gean Maidens BSN RN [facility] Care Management Coordinator [email or web address] Direct Dial: [phone]  Fax: [phone] Website: [email or web address]

## 2023-07-10 ENCOUNTER — Telehealth: Payer: Self-pay | Admitting: *Deleted

## 2023-07-10 NOTE — Transitions of Care (Post Inpatient/ED Visit) (Signed)
 07/10/2023  Name: Carolyn Mckinney MRN: [id number] DOB: 1945-07-22  Today's TOC FU Call Status: Today's TOC FU Call Status:: Successful TOC FU Call Completed TOC FU Call Complete Date: 07/10/23 Patient's Name and Date of Birth confirmed.  Transition Care Management Follow-up Telephone Call Date of Discharge: 07/08/23 Discharge Facility: [facility]) Type of Discharge: Inpatient Admission Primary Inpatient Discharge Diagnosis:: CAP with hypoxia; sepsis How have you been since you were released from the hospital?: Better ("I am doing fine, my daughter is staying with me temporarily for now, but I am pretty much back to my independent self.  We are on our way to the veterenarian office for my dog, so I can't review medicines with you- but they did not make any changes") Any questions or concerns?: No  Items Reviewed: Did you receive and understand the discharge instructions provided?: Yes (thoroughly reviewed with patient who verbalizes good understanding of same) Medications obtained,verified, and reconciled?: No Medications Not Reviewed Reasons:: Other: (Patient declined medication reconciliation/ review; she is on her way out to vet office; confirmed patient no changes to medications post-hospital discharge; self-manages medications and denies questions/ concerns around medications today) Any new allergies since your discharge?: No Dietary orders reviewed?: Yes Type of Diet Ordered:: "Healthy" Do you have support at home?: Yes People in Home: alone Name of Support/Comfort Primary Source: Reports independent in all self-care activities; supportive daughter assists as/ if needed/ indicated  Medications Reviewed Today: Medications Reviewed Today     Reviewed by Michaela Corner, RN (Registered Nurse) on 07/10/23 at 1032  Med List Status: <None>   Medication Order Taking? Sig Documenting Provider Last Dose Status Informant  amLODipine (NORVASC) 5 MG tablet [id number] No Take 1  tablet (5 mg total) by mouth daily. Sharlene Dory, DO 06/29/2023 Morning Active Self, Pharmacy Records  Calcium Carb-Cholecalciferol (CALCIUM + D3 PO) [id number] No Take 1 tablet by mouth daily. [provider] Past Week Active Self, Pharmacy Records  DULoxetine (CYMBALTA) 60 MG capsule [id number] No Take 1 capsule (60 mg total) by mouth 2 (two) times daily. Sharlene Dory, DO 06/29/2023 Morning Active Self, Pharmacy Records  Ferrous Sulfate (IRON PO) [id number] No Take 1 tablet by mouth daily. [provider] Taking Active Self, Pharmacy Records  losartan (COZAAR) 100 MG tablet [id number] No Take 1 tablet (100 mg total) by mouth daily. Sharlene Dory, DO 06/29/2023 Morning Active Self, Pharmacy Records  meloxicam [facility]) 7.5 MG tablet [id number]  Take 1 tablet by mouth once daily Ralene Cork, DO  Active Self, Pharmacy Records  Multiple Vitamin (MULTIVITAMIN ADULT PO) [id number] No Take 1 tablet by mouth daily. [provider] Past Week Active Self, Pharmacy Records  OXcarbazepine (TRILEPTAL) 150 MG tablet [id number] No Take 1 tablet by mouth twice daily Sharlene Dory, DO 06/28/2023 Morning Active Self, Pharmacy Records  solifenacin (VESICARE) 5 MG tablet [id number] No Take 1 tablet (5 mg total) by mouth daily.  Patient not taking: Reported on 06/29/2023   Sharlene Dory, DO Not Taking Active Self, Pharmacy Records           Med Note Michaela Corner   Tue Jul 10, 2023 10:32 AM) 07/10/23: verified no changes to patient's medications post-hospital discharge on 07/08/23: patient declined medication review/ reconciliation during [facility] call today  traZODone (DESYREL) 50 MG tablet [id number] No Take 1-2 tablets (50-100 mg total) by mouth at bedtime as needed. for sleep Sharlene Dory, DO 06/28/2023 Bedtime Active Self, Pharmacy Records  valACYclovir (  VALTREX) 500 MG tablet [id number] No Take 2 tablets (1,000 mg total) by mouth daily. Sharlene Dory, DO 06/29/2023 Morning Active Self, Pharmacy Records  VITAMIN K PO [id number] No Take 1 tablet by mouth daily. [provider] Past Week Active Self, Pharmacy Records  zinc gluconate 50 MG tablet [id number] No Take 50 mg by mouth daily. [provider] Past Week Active Self, Pharmacy Records           Home Care and Equipment/Supplies: Were Home Health Services Ordered?: No Any new equipment or medical supplies ordered?: No  Functional Questionnaire: Do you need assistance with bathing/showering or dressing?: No Do you need assistance with meal preparation?: No Do you need assistance with eating?: No Do you have difficulty maintaining continence: No Do you need assistance with getting out of bed/getting out of a chair/moving?: No Do you have difficulty managing or taking your medications?: No  Follow up appointments reviewed: PCP Follow-up appointment confirmed?: No (patient declined- states she is on the way to the vet office and understands to make hospital follow up appointment with PCP- she states she will schedule independently) MD Provider Line Number:[phone] Given: No (verified well-established with current PCP) Specialist Hospital Follow-up appointment confirmed?: Yes Date of Specialist follow-up appointment?: 08/30/23 (verified this is recommended time frame for follow up per hospital discharging provider notes) Follow-Up Specialty Provider:: pulmonary provider Do you need transportation to your follow-up appointment?: No Do you understand care options if your condition(s) worsen?: Yes-patient verbalized understanding  SDOH Interventions Today    Flowsheet Row Most Recent Value  SDOH Interventions   Food Insecurity Interventions Intervention Not Indicated  Housing Interventions Intervention Not Indicated  Transportation Interventions Intervention Not Indicated  [drives self]  Utilities Interventions Intervention Not Indicated       Interventions Today    Flowsheet Row Most Recent Value  Chronic Disease   Chronic disease during today's visit Other  [CAP with hypoxia,  sepsis]  General Interventions   General Interventions Discussed/Reviewed General Interventions Discussed, Durable Medical Equipment (DME), Doctor Visits  Doctor Visits Discussed/Reviewed Doctor Visits Discussed, PCP, Specialist  Durable Medical Equipment (DME) Val Riles not currently requiring/ using assistive devices for ambulation- has walker, not needing to use]  PCP/Specialist Visits Compliance with follow-up visit  Education Interventions   Education Provided Provided Education  Provided Verbal Education On When to see the doctor  [encouraged patient to schedule hospital follow up office visit with PCP- she declined assistance in scheduling today: states she prefers to do herself]  Nutrition Interventions   Nutrition Discussed/Reviewed Nutrition Discussed  Pharmacy Interventions   Pharmacy Dicussed/Reviewed Pharmacy Topics Discussed      TOC Interventions Today    Flowsheet Row Most Recent Value  TOC Interventions   TOC Interventions Discussed/Reviewed TOC Interventions Discussed  [Patient declines need for ongoing/ further care management outreach,  declines enrollment in 30-day TOC program,  provided my direct contact information should questions/ concerns/ needs arise post-TOC call]      Total time spent from review to signing of note/ including any care coordination interventions: 46 minutes  Pls call/ message for questions,  Caryl Pina, RN, BSN, Media planner  Transitions of Care  [facility] [phone]: direct office

## 2023-07-30 ENCOUNTER — Telehealth: Payer: Self-pay

## 2023-07-30 NOTE — Telephone Encounter (Signed)
 Returned patients phone call about rescheduling her annual exam.

## 2023-08-01 ENCOUNTER — Ambulatory Visit (INDEPENDENT_AMBULATORY_CARE_PROVIDER_SITE_OTHER): Admitting: Sports Medicine

## 2023-08-01 ENCOUNTER — Encounter: Payer: Self-pay | Admitting: Sports Medicine

## 2023-08-01 VITALS — BP 108/60 | Ht 61.0 in | Wt 149.0 lb

## 2023-08-01 DIAGNOSIS — M1712 Unilateral primary osteoarthritis, left knee: Secondary | ICD-10-CM | POA: Diagnosis not present

## 2023-08-01 MED ORDER — METHYLPREDNISOLONE ACETATE 40 MG/ML IJ SUSP
40.0000 mg | Freq: Once | INTRAMUSCULAR | Status: AC
  Administered 2023-08-01: 40 mg via INTRA_ARTICULAR

## 2023-08-01 NOTE — Progress Notes (Signed)
   Subjective:    Patient ID: Carolyn Mckinney, female    DOB: 03-30-1946, 78 y.o.   MRN: [id number]  HPI chief complaint: Left knee pain  Patient presents today with acute on chronic left knee pain.  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  Pain began rather acutely without injury.  She had difficulty sleeping last night.  She would like a repeat cortisone injection.   Review of Systems     Objective:   Physical Exam  Left knee: Range of motion 0 to 120 degrees.  1+ boggy synovitis.  She is tender to palpation along the medial joint line.  Knee is grossly stable to ligamentous exam.  Neurovascularly intact distally.      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  An anterior lateral approach was utilized and she tolerates this without difficulty.  Of note, she has had epidural steroid injections in the past and is planning an overseas trip for later this summer.  She is interested in a repeat epidural steroid injection prior to that trip.  I have asked her to give our office a call several days prior to leaving so that we can place an order to have this done at [facility] imaging.  Consent obtained and verified. Time-out conducted. Noted no overlying erythema, induration, or other signs of local infection. Skin prepped in a sterile fashion. Topical analgesic spray: Ethyl chloride. Joint: Left knee Needle: 25-gauge 1.5 inch Completed without difficulty. Meds: 3 cc 1% Xylocaine, 1 cc (40 mg) Depo-Medrol  This note was dictated using Dragon naturally speaking software and may contain errors in syntax, spelling, or content which have not been identified prior to signing this note.

## 2023-08-15 ENCOUNTER — Other Ambulatory Visit: Payer: Self-pay | Admitting: Family Medicine

## 2023-08-15 ENCOUNTER — Other Ambulatory Visit: Payer: Self-pay | Admitting: Sports Medicine

## 2023-08-23 ENCOUNTER — Ambulatory Visit: Admitting: Family Medicine

## 2023-08-23 VITALS — BP 107/78 | HR 74 | Ht 61.0 in | Wt 141.0 lb

## 2023-08-23 DIAGNOSIS — Z1239 Encounter for other screening for malignant neoplasm of breast: Secondary | ICD-10-CM | POA: Diagnosis not present

## 2023-08-23 DIAGNOSIS — M8588 Other specified disorders of bone density and structure, other site: Secondary | ICD-10-CM | POA: Diagnosis not present

## 2023-08-23 DIAGNOSIS — Z1231 Encounter for screening mammogram for malignant neoplasm of breast: Secondary | ICD-10-CM | POA: Diagnosis not present

## 2023-08-23 DIAGNOSIS — Z01419 Encounter for gynecological examination (general) (routine) without abnormal findings: Secondary | ICD-10-CM | POA: Diagnosis not present

## 2023-08-23 NOTE — Progress Notes (Unsigned)
   ANNUAL EXAM Patient name: Carolyn Mckinney MRN [id number]  Date of birth: Jan 08, 1946 Chief Complaint:   Annual Exam  History of Present Illness:   Carolyn Mckinney is a 78 y.o.  [phone]  female  being seen today for a routine annual exam.  Current complaints: none  No LMP recorded. Patient has had a hysterectomy.    Last pap none. Last mammogram: 02/2023. Results were: normal. Family h/o breast cancer: no Last colonoscopy: 02/2020     03/29/2023    2:03 PM 03/19/2023    2:58 PM 03/21/2022   11:33 AM 03/10/2021    1:16 PM 09/22/2020   11:23 AM  Depression screen PHQ 2/9  Decreased Interest 0 0 0 0 0  Down, Depressed, Hopeless 0 0 0 0 0  PHQ - 2 Score 0 0 0 0 0         No data to display           Review of Systems:   Pertinent items are noted in HPI Denies any headaches, blurred vision, fatigue, shortness of breath, chest pain, abdominal pain, abnormal vaginal discharge/itching/odor/irritation, problems with periods, bowel movements, urination, or intercourse unless otherwise stated above. Pertinent History Reviewed:  Reviewed past medical,surgical, social and family history.  Reviewed problem list, medications and allergies. Physical Assessment:   Vitals:   08/23/23 1400  BP: 107/78  Pulse: 74  Weight: 141 lb (64 kg)  Height: 5\' 1"  (1.549 m)  Body mass index is 26.64 kg/m.        Physical Examination:   General appearance - well appearing, and in no distress  Mental status - alert, oriented to person, place, and time  Psych:  She has a normal mood and affect  Skin - warm and dry, normal color, no suspicious lesions noted  Chest - effort normal, all lung fields clear to auscultation bilaterally  Heart - normal rate and regular rhythm  Neck:  midline trachea, no thyromegaly or nodules  Breasts - breasts appear normal, no suspicious masses, no skin or nipple changes or axillary nodes  Abdomen - soft, nontender, nondistended, no masses or  organomegaly  Pelvic - VULVA: vulva with no masses, tenderness or lesions  VAGINA: atrophic mucosa with normal color and discharge, no lesions  CERVIX: surgically absent  UTERUS: surgically absent  ADNEXA: No adnexal masses or tenderness noted.  Extremities:  No swelling or varicosities noted  Chaperone present for exam  Assessment & Plan:  1. Well woman exam with routine gynecological exam (Primary)  2. Breast cancer screening, high risk patient - MM 3D SCREENING MAMMOGRAM BILATERAL BREAST; Future  3. Encounter for screening mammogram for malignant neoplasm of breast - MM 3D SCREENING MAMMOGRAM BILATERAL BREAST; Future  4. Osteopenia of lumbar spine - DG Bone Density; Future     No orders of the defined types were placed in this encounter.   Meds: No orders of the defined types were placed in this encounter.   Follow-up: No follow-ups on file.  Neveah Bang J Flossie Wexler, DO 08/23/2023 2:14 PM

## 2023-08-28 ENCOUNTER — Telehealth (HOSPITAL_BASED_OUTPATIENT_CLINIC_OR_DEPARTMENT_OTHER): Payer: Self-pay

## 2023-08-30 ENCOUNTER — Ambulatory Visit: Admitting: Pulmonary Disease

## 2023-08-30 ENCOUNTER — Encounter: Payer: Self-pay | Admitting: Pulmonary Disease

## 2023-08-30 VITALS — BP 123/82 | HR 87 | Ht 61.0 in | Wt 143.0 lb

## 2023-08-30 DIAGNOSIS — J849 Interstitial pulmonary disease, unspecified: Secondary | ICD-10-CM

## 2023-08-30 DIAGNOSIS — I2729 Other secondary pulmonary hypertension: Secondary | ICD-10-CM

## 2023-08-30 DIAGNOSIS — J189 Pneumonia, unspecified organism: Secondary | ICD-10-CM

## 2023-08-30 NOTE — Patient Instructions (Signed)
 VISIT SUMMARY:  Today, we discussed your ongoing respiratory symptoms and fatigue following your recent hospitalization for multilobar pneumonia. We reviewed your current symptoms, including shortness of breath and productive cough, and discussed the next steps for monitoring your recovery and overall health.  YOUR PLAN:  -MULTILOBAR PNEUMONIA: Multilobar pneumonia is an infection that affects multiple lobes of the lungs, causing inflammation and consolidation. You are experiencing residual symptoms such as shortness of breath and a productive cough. We will reassess your lung condition with a repeat chest CT in 2-3 months to ensure your lungs are recovering and to rule out any other underlying conditions.  -PULMONARY HYPERTENSION: Pulmonary hypertension is high blood pressure in the lungs' arteries, which can occur secondary to pneumonia and high oxygen requirements. We will monitor your cardiac function with a repeat echocardiogram in 2-3 months to ensure the pulmonary hypertension is improving as your pneumonia resolves.  -HYPERTENSION: Hypertension is high blood pressure, which you are managing with medication. There are no current issues with your blood pressure that need to be addressed at this time.  -OSTEOPOROSIS: Osteoporosis is a condition where bones become weak and brittle. There are no current issues with your osteoporosis that need to be addressed at this time.  -HISTORY OF BREAST CANCER: You have a history of breast cancer that was treated with a lumpectomy in 2001. There are no current issues related to your breast cancer that need to be addressed at this time.  INSTRUCTIONS:  Please schedule a repeat chest CT and echocardiogram in 2-3 months to monitor your lung recovery and cardiac function. Continue taking your blood pressure medication as prescribed. If you experience any worsening of symptoms or new concerns, please contact our office.

## 2023-08-30 NOTE — Progress Notes (Signed)
 Carolyn Mckinney    [id number]    Aug 24, 1945  Primary Care Physician:Wendling, Shellie Dials, DO  Referring Physician: Jobe Mulder, DO [address] [place],  [place] [address]  Chief complaint:  Posthospitalization follow-up Evaluation for interstitial lung disease, pulmonary hypertension  HPI: 78 y.o. who  has a past medical history of Allergy, Cancer (HCC), Cataract, Closed patellar sleeve fracture of right knee (05/2019), Hypertension, Meniere's disease, and Personal history of radiation therapy (2001).  Discussed the use of AI scribe software for clinical note transcription with the patient, who gave verbal consent to proceed.  History of Present Illness Carolyn Mckinney is a 78 year old female who presents with persistent respiratory symptoms and fatigue.  Last month, she was hospitalized for multilobar pneumonia affecting both lungs and was treated with antibiotics and high-flow oxygen. She was discharged on July 08, 2023, without the need for supplemental oxygen at home. Since discharge, she has experienced persistent difficulty regaining strength and ongoing respiratory symptoms.  She describes her breathing as not quite back to normal, experiencing shortness of breath with talking and walking. She is currently unable to take her dog on long walks and is limited to short walks. She notes that if she talks continuously, she feels she is not getting enough oxygen.  She continues to experience a productive cough, especially in the mornings or when rolling over in bed, producing yellow sputum, which is gradually becoming clearer.  Her past medical history includes hypertension, for which she takes blood pressure medication, and a history of breast cancer diagnosed in 2001, treated with a lumpectomy. She denies any history of smoking and has no family history of lung disease.  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 She has a Designer, jewellery and denies any exposure to mold, feather pillows, or other potential allergens at home.  During the review of symptoms, she denies any history of sleep apnea or snoring. She mentions being more of a mouth breather, which she noticed during dental visits when required to breathe through her nose.    Pets: Used to have a dog Occupation: Worked as a IT sales professional, went to [employer] school Exposures: No mold, hot tub, Financial controller.  No feather pillow or comforter ILD questionnaire 08/30/2023-all negative No h/o chemo/XRT/amiodarone/macrodantin/MTX  No exposure to asbestos, silica or other organic allergens  Smoking history: Never smoker Travel history: Lived in [place] for 48 years. Relevant family history: No family history of lung disease   Outpatient Encounter Medications as of 08/30/2023  Medication Sig   amLODipine  (NORVASC ) 5 MG tablet Take 1 tablet by mouth once daily   Calcium Carb-Cholecalciferol (CALCIUM + D3 PO) Take 1 tablet by mouth daily.   DULoxetine  (CYMBALTA ) 60 MG capsule Take 1 capsule (60 mg total) by mouth 2 (two) times daily.   Ferrous Sulfate  (IRON PO) Take 1 tablet by mouth daily.   losartan  (COZAAR ) 100 MG tablet Take 1 tablet (100 mg total) by mouth daily.   meloxicam  (MOBIC ) 7.5 MG tablet Take 1 tablet by mouth once daily   Multiple Vitamin (MULTIVITAMIN ADULT PO) Take 1 tablet by mouth daily.   OXcarbazepine  (TRILEPTAL ) 150 MG tablet Take 1 tablet by mouth twice daily   traZODone  (DESYREL ) 50 MG tablet Take 1-2 tablets (50-100 mg total) by mouth at bedtime as needed. for sleep   valACYclovir  (VALTREX )  500 MG tablet Take 2 tablets (1,000 mg total) by mouth daily.   VITAMIN K PO Take 1 tablet by mouth daily.   zinc  gluconate 50 MG tablet Take 50 mg by mouth daily.   solifenacin  (VESICARE ) 5 MG tablet Take 1 tablet (5 mg total) by mouth daily. (Patient not taking: Reported on 06/29/2023)    No facility-administered encounter medications on file as of 08/30/2023.    Allergies as of 08/30/2023   (No Known Allergies)    Past Medical History:  Diagnosis Date   Allergy    Cancer [facility])    Breast   Cataract    cataract repair bilaterally   Closed patellar sleeve fracture of right knee 05/2019   Hypertension    Meniere's disease    Personal history of radiation therapy 2001   Left Breast Cancer    Past Surgical History:  Procedure Laterality Date   BREAST BIOPSY Left 2001   BREAST LUMPECTOMY Left 01/25/2000   COLONOSCOPY  2006   VAGINAL HYSTERECTOMY  1991   Done for bleeding. Benign pathology    Family History  Problem Relation Age of Onset   Arthritis Mother    Heart disease Father    Colon cancer Neg Hx    Colon polyps Neg Hx    Esophageal cancer Neg Hx    Rectal cancer Neg Hx    Stomach cancer Neg Hx     Social History   Socioeconomic History   Marital status: Widowed    Spouse name: Not on file   Number of children: Not on file   Years of education: Not on file   Highest education level: Master's degree (e.g., MA, MS, MEng, MEd, MSW, MBA)  Occupational History   Not on file  Tobacco Use   Smoking status: Never   Smokeless tobacco: Never  Vaping Use   Vaping status: Never Used  Substance and Sexual Activity   Alcohol use: Yes    Comment: just wine/glass of wine in the evening   Drug use: Never   Sexual activity: Not Currently  Other Topics Concern   Not on file  Social History Narrative   Left Handed   One Story Home   Drinks Caffeine    Social Drivers of Health   Financial Resource Strain: Low Risk  (05/21/2023)   Overall Financial Resource Strain (CARDIA)    Difficulty of Paying Living Expenses: Not hard at all  Food Insecurity: No Food Insecurity (07/10/2023)   Hunger Vital Sign    Worried About Running Out of Food in the Last Year: Never true    Ran Out of Food in the Last Year: Never true  Transportation Needs: No  Transportation Needs (07/10/2023)   PRAPARE - Administrator, Civil Service (Medical): No    Lack of Transportation (Non-Medical): No  Physical Activity: Sufficiently Active (05/21/2023)   Exercise Vital Sign    Days of Exercise per Week: 4 days    Minutes of Exercise per Session: 40 min  Recent Concern: Physical Activity - Insufficiently Active (03/24/2023)   Exercise Vital Sign    Days of Exercise per Week: 2 days    Minutes of Exercise per Session: 40 min  Stress: No Stress Concern Present (05/21/2023)   [employer] of Occupational Health - Occupational Stress Questionnaire    Feeling of Stress : Not at all  Social Connections: Moderately Integrated (06/30/2023)   Social Connection and Isolation Panel [NHANES]    Frequency of Communication with Friends  and Family: More than three times a week    Frequency of Social Gatherings with Friends and Family: Three times a week    Attends Religious Services: More than 4 times per year    Active Member of Clubs or Organizations: Yes    Attends Banker Meetings: More than 4 times per year    Marital Status: Widowed  Intimate Partner Violence: Not At Risk (07/10/2023)   Humiliation, Afraid, Rape, and Kick questionnaire    Fear of Current or Ex-Partner: No    Emotionally Abused: No    Physically Abused: No    Sexually Abused: No    Review of systems: Review of Systems  Constitutional: Negative for fever and chills.  HENT: Negative.   Eyes: Negative for blurred vision.  Respiratory: as per HPI  Cardiovascular: Negative for chest pain and palpitations.  Gastrointestinal: Negative for vomiting, diarrhea, blood per rectum. Genitourinary: Negative for dysuria, urgency, frequency and hematuria.  Musculoskeletal: Negative for myalgias, back pain and joint pain.  Skin: Negative for itching and rash.  Neurological: Negative for dizziness, tremors, focal weakness, seizures and loss of consciousness.   Endo/Heme/Allergies: Negative for environmental allergies.  Psychiatric/Behavioral: Negative for depression, suicidal ideas and hallucinations.  All other systems reviewed and are negative.  Physical Exam: Blood pressure 123/82, pulse 87, height 5\' 1"  (1.549 m), weight 143 lb (64.9 kg), SpO2 96%. Gen:      No acute distress HEENT:  EOMI, sclera anicteric Neck:     No masses; no thyromegaly Lungs:    Clear to auscultation bilaterally; normal respiratory effort CV:         Regular rate and rhythm; no murmurs Abd:      + bowel sounds; soft, non-tender; no palpable masses, no distension Ext:    No edema; adequate peripheral perfusion Skin:      Warm and dry; no rash Neuro: alert and oriented x 3 Psych: normal mood and affect  Data Reviewed: Imaging: CTA 06/29/2023- No pulmonary embolism, widespread heterogeneous consolidation, dense groundglass disease, cardiomegaly  PFTs:  Labs: CTD serologies 07/01/2023-significant only for rheumatoid factor of 16.5, negative hypersensitivity panel  Cardiac: Echocardiogram 06/30/2023-LVEF 55-60%, mild LVH, normal RV systolic size and function, moderate elevation in PA systolic pressure, estimated RVSP 48.6  Assessment & Plan Multilobar pneumonia Recent hospitalization for multilobar pneumonia with significant inflammation and consolidation in both lungs. She is experiencing residual symptoms including dyspnea and productive cough with yellow sputum. Improvement is noted but not fully resolved.  There is concern for underlying interstitial lung disease; initial tests for autoimmune diseases were negative except for borderline elevated rheumatoid factor which is nonspecific.  There are no exposures noted no symptoms of connective tissue disease. Plan to reassess lung condition with imaging to ensure resolution and rule out other underlying conditions. - Order repeat chest CT in 2-3 months to assess lung recovery and rule out interstitial lung  disease.  Pulmonary hypertension Pulmonary hypertension likely secondary to pneumonia and high oxygen requirements, with echocardiogram showing stress on the right heart. Improvement is expected as pneumonia resolves. Plan to monitor cardiac function to ensure resolution of pulmonary hypertension. - Order repeat echocardiogram in 2-3 months to assess improvement in pulmonary hypertension.  If persistent then she may need a right heart catheterization   Recommendations: Follow-up high-res CT, PFTs  Phyllis Breeze MD  Pulmonary and Critical Care 08/30/2023, 3:06 PM  CC: Jobe Mulder*

## 2023-09-04 ENCOUNTER — Encounter: Payer: Self-pay | Admitting: Family Medicine

## 2023-09-05 ENCOUNTER — Telehealth (HOSPITAL_BASED_OUTPATIENT_CLINIC_OR_DEPARTMENT_OTHER): Payer: Self-pay

## 2023-09-05 ENCOUNTER — Ambulatory Visit

## 2023-09-05 ENCOUNTER — Telehealth: Payer: Self-pay | Admitting: *Deleted

## 2023-09-05 DIAGNOSIS — Z780 Asymptomatic menopausal state: Secondary | ICD-10-CM | POA: Diagnosis not present

## 2023-09-05 DIAGNOSIS — M8589 Other specified disorders of bone density and structure, multiple sites: Secondary | ICD-10-CM | POA: Diagnosis not present

## 2023-09-05 DIAGNOSIS — M8588 Other specified disorders of bone density and structure, other site: Secondary | ICD-10-CM

## 2023-09-05 DIAGNOSIS — M5416 Radiculopathy, lumbar region: Secondary | ICD-10-CM

## 2023-09-05 DIAGNOSIS — Z13820 Encounter for screening for osteoporosis: Secondary | ICD-10-CM | POA: Diagnosis not present

## 2023-09-05 NOTE — Telephone Encounter (Signed)
-----   Message from Clearnce Curia sent at 09/04/2023  3:30 PM EDT ----- Regarding: FW: Pt cld w/ 2 request needs Order for Epidural to [facility] & trigger finger Injection Okay to order a repeat lumbar ESI at [facility] imaging ----- Message ----- From: Rondel Coddington Sent: 09/04/2023   2:47 PM EDT To: Frazier Jacob, DO Subject: Pt cld w/ 2 request needs Order for Epidural#   Advised pt trigger finger inject to be done @ [facility] w/ Dr.Hudnall /schd 09/19/23.  Patient also ask if provider can send order to [facility] for Epidural in Lower Back.   Thx

## 2023-09-05 NOTE — Telephone Encounter (Signed)
Epidural order placed.  

## 2023-09-06 ENCOUNTER — Telehealth: Payer: Self-pay | Admitting: *Deleted

## 2023-09-06 DIAGNOSIS — M5416 Radiculopathy, lumbar region: Secondary | ICD-10-CM

## 2023-09-06 NOTE — Telephone Encounter (Signed)
-----   Message from Clearnce Curia sent at 09/06/2023  4:13 PM EDT ----- Regarding: FW: Patient ask for JUly Epidural before she goes to [place] for a month trip It is okay with me to schedule this ----- Message ----- From: Rondel Coddington Sent: 09/06/2023   2:48 PM EDT To: Frazier Jacob, DO Subject: Patient ask for JUly Epidural before she goe#  Pt cld states [facility] told her she could have another epidural in July if a separate order was submitted to them .  Patient ask if this can be sent before she goes   Pls & Thx U

## 2023-09-06 NOTE — Telephone Encounter (Signed)
Epidural order placed.  

## 2023-09-07 ENCOUNTER — Ambulatory Visit: Payer: Self-pay | Admitting: Family Medicine

## 2023-09-12 NOTE — Discharge Instructions (Signed)

## 2023-09-14 ENCOUNTER — Ambulatory Visit
Admission: RE | Admit: 2023-09-14 | Discharge: 2023-09-14 | Disposition: A | Discharge status: Home / Self Care | Source: Ambulatory Visit | Attending: Sports Medicine | Admitting: Sports Medicine

## 2023-09-14 DIAGNOSIS — M4727 Other spondylosis with radiculopathy, lumbosacral region: Secondary | ICD-10-CM | POA: Diagnosis not present

## 2023-09-14 DIAGNOSIS — M5416 Radiculopathy, lumbar region: Secondary | ICD-10-CM

## 2023-09-14 MED ORDER — IOPAMIDOL (ISOVUE-M 200) INJECTION 41%
1.0000 mL | Freq: Once | INTRAMUSCULAR | Status: AC
  Administered 2023-09-14: 1 mL via EPIDURAL

## 2023-09-14 MED ORDER — METHYLPREDNISOLONE ACETATE 40 MG/ML INJ SUSP (RADIOLOG
80.0000 mg | Freq: Once | INTRAMUSCULAR | Status: AC
  Administered 2023-09-14: 80 mg via EPIDURAL

## 2023-09-18 ENCOUNTER — Other Ambulatory Visit (HOSPITAL_BASED_OUTPATIENT_CLINIC_OR_DEPARTMENT_OTHER)

## 2023-09-19 ENCOUNTER — Other Ambulatory Visit: Payer: Self-pay

## 2023-09-19 ENCOUNTER — Ambulatory Visit: Admitting: Family Medicine

## 2023-09-19 VITALS — BP 127/85 | Ht 60.5 in | Wt 140.0 lb

## 2023-09-19 DIAGNOSIS — M79644 Pain in right finger(s): Secondary | ICD-10-CM

## 2023-09-19 MED ORDER — METHYLPREDNISOLONE ACETATE 40 MG/ML IJ SUSP
20.0000 mg | Freq: Once | INTRAMUSCULAR | Status: AC
  Administered 2023-09-19: 20 mg via INTRA_ARTICULAR

## 2023-09-21 ENCOUNTER — Encounter: Payer: Self-pay | Admitting: Family Medicine

## 2023-09-21 ENCOUNTER — Other Ambulatory Visit: Payer: Self-pay | Admitting: Family Medicine

## 2023-09-21 NOTE — Progress Notes (Signed)
 PCP: Jobe Mulder, DO  Subjective:   HPI: Patient is a 78 y.o. female here for right trigger finger.  Patient reports about 2 months ago she started to get locking in flexion of her right 3rd digit. Worse first thing in the morning. Gets stuck in flexion. Has history of this before in left hand that responded well to injection. No injury/trauma.  Past Medical History:  Diagnosis Date   Allergy    Cancer (HCC)    Breast   Cataract    cataract repair bilaterally   Closed patellar sleeve fracture of right knee 05/2019   Hypertension    Meniere's disease    Personal history of radiation therapy 2001   Left Breast Cancer    Current Outpatient Medications on File Prior to Visit  Medication Sig Dispense Refill   amLODipine  (NORVASC ) 5 MG tablet Take 1 tablet by mouth once daily 90 tablet 0   Calcium Carb-Cholecalciferol (CALCIUM + D3 PO) Take 1 tablet by mouth daily.     DULoxetine  (CYMBALTA ) 60 MG capsule Take 1 capsule (60 mg total) by mouth 2 (two) times daily. 180 capsule 1   Ferrous Sulfate  (IRON PO) Take 1 tablet by mouth daily.     losartan  (COZAAR ) 100 MG tablet Take 1 tablet (100 mg total) by mouth daily. 90 tablet 3   meloxicam  (MOBIC ) 7.5 MG tablet Take 1 tablet by mouth once daily 30 tablet 0   Multiple Vitamin (MULTIVITAMIN ADULT PO) Take 1 tablet by mouth daily.     OXcarbazepine  (TRILEPTAL ) 150 MG tablet Take 1 tablet by mouth twice daily 60 tablet 11   solifenacin  (VESICARE ) 5 MG tablet Take 1 tablet (5 mg total) by mouth daily. (Patient not taking: Reported on 06/29/2023) 30 tablet 2   traZODone  (DESYREL ) 50 MG tablet Take 1-2 tablets (50-100 mg total) by mouth at bedtime as needed. for sleep 180 tablet 3   valACYclovir  (VALTREX ) 500 MG tablet Take 2 tablets (1,000 mg total) by mouth daily. 180 tablet 3   VITAMIN K PO Take 1 tablet by mouth daily.     zinc  gluconate 50 MG tablet Take 50 mg by mouth daily.     No current facility-administered medications  on file prior to visit.    Past Surgical History:  Procedure Laterality Date   BREAST BIOPSY Left 2001   BREAST LUMPECTOMY Left 01/25/2000   COLONOSCOPY  2006   VAGINAL HYSTERECTOMY  1991   Done for bleeding. Benign pathology    No Known Allergies  BP 127/85   Ht 5' 0.5" (1.537 m)   Wt 140 lb (63.5 kg)   BMI 26.89 kg/m       No data to display              No data to display              Objective:  Physical Exam:  Gen: NAD, comfortable in exam room  Right hand: 3rd digit catches in flexion of the PIP joint.  Small nodule felt at A1 pulley.  No bruising, malrotation, angulation, other deformity. Mild TTP over A1 pulley 3rd digit.  No other tenderness. FROM at PIP, MCP, DIP joints 3rd digit. NVI distally.   Assessment & Plan:  1. Right 3rd digit trigger finger - discussed options and she wanted to proceed with injection today which was performed with ultrasound guidance.  Discussed band-aid splinting of PIP for next week while waiting for this to work.  Follow up in  1 month if not improving.  After informed written consent timeout was performed.  Area overlying right 3rd A1 pulley prepped with alcohol swabs then utilizing ultrasound guidance right 3rd flexor tendon sheath injected with 0.5:0.65mL lidocaine:depomedrol.  Patient tolerated procedure well without immediate complications.

## 2023-09-24 ENCOUNTER — Other Ambulatory Visit: Payer: Self-pay | Admitting: *Deleted

## 2023-09-24 MED ORDER — MELOXICAM 7.5 MG PO TABS
7.5000 mg | ORAL_TABLET | Freq: Every day | ORAL | 0 refills | Status: DC
Start: 2023-09-24 — End: 2023-11-14

## 2023-09-27 ENCOUNTER — Other Ambulatory Visit: Payer: Self-pay | Admitting: Sports Medicine

## 2023-10-02 ENCOUNTER — Other Ambulatory Visit: Payer: Self-pay | Admitting: Family Medicine

## 2023-10-02 DIAGNOSIS — R202 Paresthesia of skin: Secondary | ICD-10-CM

## 2023-10-02 MED ORDER — DULOXETINE HCL 60 MG PO CPEP: 60.0000 mg | ORAL_CAPSULE | Freq: Two times a day (BID) | ORAL | 0 refills | Status: DC

## 2023-10-02 MED ORDER — VALACYCLOVIR HCL 500 MG PO TABS: 1000.0000 mg | ORAL_TABLET | Freq: Every day | ORAL | 0 refills | Status: DC

## 2023-10-02 MED ORDER — SOLIFENACIN SUCCINATE 5 MG PO TABS: 5.0000 mg | ORAL_TABLET | Freq: Every day | ORAL | 0 refills | Status: DC

## 2023-10-02 MED ORDER — AMLODIPINE BESYLATE 5 MG PO TABS: 5.0000 mg | ORAL_TABLET | Freq: Every day | ORAL | 0 refills | Status: DC

## 2023-10-02 MED ORDER — OXCARBAZEPINE 150 MG PO TABS: 150.0000 mg | ORAL_TABLET | Freq: Two times a day (BID) | ORAL | 0 refills | Status: DC

## 2023-10-02 NOTE — Telephone Encounter (Signed)
 Copied from CRM [id number]. Topic: Clinical - Medication Refill >> Oct 02, 2023  9:35 AM Freya Jesus wrote: Medication: valACYclovir  (VALTREX ) 500 MG tablet [[id number]] solifenacin  (VESICARE ) 5 MG tablet [[id number]] OXcarbazepine  (TRILEPTAL ) 150 MG tablet [[id number]] DULoxetine  (CYMBALTA ) 60 MG capsule [[id number]] amLODipine  (NORVASC ) 5 MG tablet [[id number]]  Has the patient contacted their pharmacy? Yes (Agent: If no, request that the patient contact the pharmacy for the refill. If patient does not wish to contact the pharmacy document the reason why and proceed with request.) (Agent: If yes, when and what did the pharmacy advise?) Pharmacy called  This is the patient's preferred pharmacy:    [facility] - [place], [place] - [address] [place] [address] Phone: [phone] Fax: [phone]  Is this the correct pharmacy for this prescription? Yes If no, delete pharmacy and type the correct one.   Has the prescription been filled recently? No  Is the patient out of the medication? Yes  Has the patient been seen for an appointment in the last year OR does the patient have an upcoming appointment? Yes  Can we respond through MyChart? No  Agent: Please be advised that Rx refills may take up to 3 business days. We ask that you follow-up with your pharmacy.

## 2023-10-04 ENCOUNTER — Other Ambulatory Visit: Payer: Self-pay | Admitting: Family Medicine

## 2023-10-08 ENCOUNTER — Other Ambulatory Visit: Payer: Self-pay | Admitting: Family Medicine

## 2023-10-08 DIAGNOSIS — R202 Paresthesia of skin: Secondary | ICD-10-CM

## 2023-10-17 ENCOUNTER — Encounter

## 2023-10-24 ENCOUNTER — Other Ambulatory Visit: Payer: Self-pay

## 2023-10-24 DIAGNOSIS — G4700 Insomnia, unspecified: Secondary | ICD-10-CM

## 2023-10-24 DIAGNOSIS — R202 Paresthesia of skin: Secondary | ICD-10-CM

## 2023-10-24 MED ORDER — SOLIFENACIN SUCCINATE 5 MG PO TABS: 5.0000 mg | ORAL_TABLET | Freq: Every day | ORAL | 2 refills | Status: AC

## 2023-10-24 MED ORDER — DULOXETINE HCL 60 MG PO CPEP
60.0000 mg | ORAL_CAPSULE | Freq: Two times a day (BID) | ORAL | 0 refills | Status: DC
Start: 2023-10-24 — End: 2023-11-14

## 2023-10-24 MED ORDER — AMLODIPINE BESYLATE 5 MG PO TABS: 5.0000 mg | ORAL_TABLET | Freq: Every day | ORAL | 0 refills | Status: DC

## 2023-10-24 MED ORDER — TRAZODONE HCL 50 MG PO TABS: 50.0000 mg | ORAL_TABLET | Freq: Every evening | ORAL | 3 refills | Status: DC | PRN

## 2023-10-24 MED ORDER — OXCARBAZEPINE 150 MG PO TABS: 150.0000 mg | ORAL_TABLET | Freq: Two times a day (BID) | ORAL | 11 refills | Status: AC

## 2023-10-29 DIAGNOSIS — L820 Inflamed seborrheic keratosis: Secondary | ICD-10-CM | POA: Diagnosis not present

## 2023-10-29 DIAGNOSIS — L821 Other seborrheic keratosis: Secondary | ICD-10-CM | POA: Diagnosis not present

## 2023-10-29 DIAGNOSIS — D229 Melanocytic nevi, unspecified: Secondary | ICD-10-CM | POA: Diagnosis not present

## 2023-10-29 DIAGNOSIS — L814 Other melanin hyperpigmentation: Secondary | ICD-10-CM | POA: Diagnosis not present

## 2023-10-30 ENCOUNTER — Ambulatory Visit
Admission: RE | Admit: 2023-10-30 | Discharge: 2023-10-30 | Disposition: A | Discharge status: Home / Self Care | Source: Ambulatory Visit | Attending: Pulmonary Disease

## 2023-10-30 DIAGNOSIS — R918 Other nonspecific abnormal finding of lung field: Secondary | ICD-10-CM | POA: Diagnosis not present

## 2023-10-30 DIAGNOSIS — I2729 Other secondary pulmonary hypertension: Secondary | ICD-10-CM

## 2023-10-30 DIAGNOSIS — J849 Interstitial pulmonary disease, unspecified: Secondary | ICD-10-CM

## 2023-10-31 ENCOUNTER — Ambulatory Visit (HOSPITAL_COMMUNITY)
Admission: RE | Admit: 2023-10-31 | Discharge: 2023-10-31 | Disposition: A | Discharge status: Home / Self Care | Source: Ambulatory Visit | Attending: Cardiovascular Disease | Admitting: Cardiovascular Disease

## 2023-10-31 DIAGNOSIS — J849 Interstitial pulmonary disease, unspecified: Secondary | ICD-10-CM | POA: Insufficient documentation

## 2023-10-31 DIAGNOSIS — I2729 Other secondary pulmonary hypertension: Secondary | ICD-10-CM | POA: Insufficient documentation

## 2023-10-31 LAB — ECHOCARDIOGRAM COMPLETE: S' Lateral: 1.96 cm

## 2023-10-31 NOTE — Discharge Instructions (Signed)

## 2023-11-01 ENCOUNTER — Ambulatory Visit
Admission: RE | Admit: 2023-11-01 | Discharge: 2023-11-01 | Disposition: A | Discharge status: Home / Self Care | Source: Ambulatory Visit | Attending: Sports Medicine | Admitting: Sports Medicine

## 2023-11-01 DIAGNOSIS — M5416 Radiculopathy, lumbar region: Secondary | ICD-10-CM

## 2023-11-01 DIAGNOSIS — M4727 Other spondylosis with radiculopathy, lumbosacral region: Secondary | ICD-10-CM | POA: Diagnosis not present

## 2023-11-01 MED ORDER — IOPAMIDOL (ISOVUE-M 200) INJECTION 41%
1.0000 mL | Freq: Once | INTRAMUSCULAR | Status: AC
  Administered 2023-11-01: 1 mL via EPIDURAL

## 2023-11-01 MED ORDER — METHYLPREDNISOLONE ACETATE 40 MG/ML INJ SUSP (RADIOLOG
80.0000 mg | Freq: Once | INTRAMUSCULAR | Status: AC
  Administered 2023-11-01: 80 mg via EPIDURAL

## 2023-11-06 ENCOUNTER — Other Ambulatory Visit: Payer: Self-pay | Admitting: *Deleted

## 2023-11-06 ENCOUNTER — Ambulatory Visit: Payer: Self-pay | Admitting: Pulmonary Disease

## 2023-11-06 DIAGNOSIS — J849 Interstitial pulmonary disease, unspecified: Secondary | ICD-10-CM

## 2023-11-08 ENCOUNTER — Ambulatory Visit: Admitting: Pulmonary Disease

## 2023-11-08 DIAGNOSIS — J849 Interstitial pulmonary disease, unspecified: Secondary | ICD-10-CM

## 2023-11-08 LAB — PULMONARY FUNCTION TEST
DL/VA % pred: 93 %
DL/VA: 3.92 ml/min/mmHg/L
DLCO cor % pred: 90 %
DLCO cor: 15.15 ml/min/mmHg
DLCO unc % pred: 90 %
DLCO unc: 15.15 ml/min/mmHg
FEF 25-75 Post: 2.89 L/s
FEF 25-75 Pre: 2.59 L/s
FEF2575-%Change-Post: 11 %
FEF2575-%Pred-Post: 220 %
FEF2575-%Pred-Pre: 196 %
FEV1-%Change-Post: 2 %
FEV1-%Pred-Post: 126 %
FEV1-%Pred-Pre: 123 %
FEV1-Post: 2.14 L
FEV1-Pre: 2.08 L
FEV1FVC-%Change-Post: 1 %
FEV1FVC-%Pred-Pre: 112 %
FEV6-%Change-Post: 1 %
FEV6-%Pred-Post: 117 %
FEV6-%Pred-Pre: 116 %
FEV6-Post: 2.53 L
FEV6-Pre: 2.49 L
FEV6FVC-%Change-Post: 0 %
FEV6FVC-%Pred-Post: 105 %
FEV6FVC-%Pred-Pre: 106 %
FVC-%Change-Post: 1 %
FVC-%Pred-Post: 111 %
FVC-%Pred-Pre: 109 %
FVC-Post: 2.54 L
FVC-Pre: 2.49 L
Post FEV1/FVC ratio: 84 %
Post FEV6/FVC ratio: 100 %
Pre FEV1/FVC ratio: 83 %
Pre FEV6/FVC Ratio: 100 %
RV % pred: 88 %
RV: 1.91 L
TLC % pred: 99 %
TLC: 4.51 L

## 2023-11-08 NOTE — Progress Notes (Signed)
 Full PFT performed today.

## 2023-11-08 NOTE — Patient Instructions (Signed)
 Full PFT performed today.

## 2023-11-12 ENCOUNTER — Other Ambulatory Visit: Payer: Self-pay | Admitting: Family Medicine

## 2023-11-12 DIAGNOSIS — R202 Paresthesia of skin: Secondary | ICD-10-CM

## 2023-11-12 NOTE — Telephone Encounter (Signed)
 Just filled 10/24/23

## 2023-11-14 ENCOUNTER — Other Ambulatory Visit: Payer: Self-pay

## 2023-11-14 DIAGNOSIS — G4700 Insomnia, unspecified: Secondary | ICD-10-CM

## 2023-11-14 DIAGNOSIS — R202 Paresthesia of skin: Secondary | ICD-10-CM

## 2023-11-14 MED ORDER — LOSARTAN POTASSIUM 100 MG PO TABS
100.0000 mg | ORAL_TABLET | Freq: Every day | ORAL | 3 refills | Status: AC
Start: 2023-11-14 — End: ?

## 2023-11-14 MED ORDER — TRAZODONE HCL 50 MG PO TABS: 50.0000 mg | ORAL_TABLET | Freq: Every evening | ORAL | 3 refills | Status: AC | PRN

## 2023-11-14 MED ORDER — AMLODIPINE BESYLATE 5 MG PO TABS: 5.0000 mg | ORAL_TABLET | Freq: Every day | ORAL | 0 refills | Status: DC

## 2023-11-14 MED ORDER — DULOXETINE HCL 60 MG PO CPEP
60.0000 mg | ORAL_CAPSULE | Freq: Two times a day (BID) | ORAL | 0 refills | Status: DC
Start: 2023-11-14 — End: 2023-12-06

## 2023-11-14 MED ORDER — MELOXICAM 7.5 MG PO TABS: 7.5000 mg | ORAL_TABLET | Freq: Every day | ORAL | 0 refills | Status: DC

## 2023-11-19 ENCOUNTER — Ambulatory Visit: Payer: Self-pay | Admitting: Pulmonary Disease

## 2023-12-05 ENCOUNTER — Other Ambulatory Visit: Payer: Self-pay | Admitting: Family Medicine

## 2023-12-05 DIAGNOSIS — R202 Paresthesia of skin: Secondary | ICD-10-CM

## 2023-12-08 ENCOUNTER — Other Ambulatory Visit: Payer: Self-pay | Admitting: Family Medicine

## 2023-12-13 ENCOUNTER — Other Ambulatory Visit: Payer: Self-pay | Admitting: Family Medicine

## 2023-12-18 ENCOUNTER — Ambulatory Visit (INDEPENDENT_AMBULATORY_CARE_PROVIDER_SITE_OTHER): Admitting: Pulmonary Disease

## 2023-12-18 ENCOUNTER — Encounter: Payer: Self-pay | Admitting: Pulmonary Disease

## 2023-12-18 VITALS — BP 116/80 | HR 81 | Temp 98.6°F | Ht 60.0 in | Wt 147.0 lb

## 2023-12-18 DIAGNOSIS — I272 Pulmonary hypertension, unspecified: Secondary | ICD-10-CM | POA: Diagnosis not present

## 2023-12-18 DIAGNOSIS — J189 Pneumonia, unspecified organism: Secondary | ICD-10-CM

## 2023-12-18 DIAGNOSIS — J9601 Acute respiratory failure with hypoxia: Secondary | ICD-10-CM

## 2023-12-18 NOTE — Patient Instructions (Signed)
  VISIT SUMMARY: Today, we discussed your recent hospitalization for pneumonia and reviewed your current health status. You reported feeling much better and have resumed your normal activities, including walking more. We also addressed your concerns about yellow oral secretions and a raspy voice in the mornings.  YOUR PLAN: -BRONCHIECTASIS: Bronchiectasis is a condition where the airways in your lungs become widened and scarred. Your CT scan showed mild bronchiectasis, but you are currently asymptomatic with no lung congestion. No specific treatment is needed at this time.  -HISTORY OF PNEUMONIA: Your pneumonia has resolved, and your lung function tests are normal. There is no evidence of any ongoing lung disease.  -HISTORY OF PULMONARY HYPERTENSION: Pulmonary hypertension is high blood pressure in the lungs. This was observed during your pneumonia hospitalization but has since resolved.  -RASPY VOICE AND YELLOW DRIED MATERIAL ON LIPS: The yellow dried material on your lips may be due to dry skin and is not related to your lung condition. Your raspy voice is also not linked to any lung issues. It is possible that these symptoms could be a side effect of VESIcare , the medication you take for urinary incontinence, but this is not confirmed. You should consult your primary care provider about this possibility.  INSTRUCTIONS: Please follow up with your primary care provider to discuss the potential side effects of VESIcare . Continue your normal activities and monitor your symptoms. If you experience any new or worsening symptoms, contact your healthcare provider.                                Contains text generated by [employer].

## 2023-12-18 NOTE — Progress Notes (Signed)
 Carolyn Mckinney    [id number]    02-05-1946  Primary Care Physician:Wendling, Mabel Mt, DO  Referring Physician: Frann Mabel Mt, DO [address] [place],  [place] [address]  Chief complaint:  Posthospitalization follow-up Evaluation for interstitial lung disease, pulmonary hypertension  HPI: 78 y.o. who  has a past medical history of Allergy, Cancer (HCC), Cataract, Closed patellar sleeve fracture of right knee (05/2019), Hypertension, Meniere's disease, and Personal history of radiation therapy (2001).  Discussed the use of AI scribe software for clinical note transcription with the patient, who gave verbal consent to proceed.  History of Present Illness Carolyn Mckinney is a 78 year old female who presents with persistent respiratory symptoms and fatigue.  Last month, she was hospitalized for multilobar pneumonia affecting both lungs and was treated with antibiotics and high-flow oxygen. She was discharged on July 08, 2023, without the need for supplemental oxygen at home. Since discharge, she has experienced persistent difficulty regaining strength and ongoing respiratory symptoms.  She describes her breathing as not quite back to normal, experiencing shortness of breath with talking and walking. She is currently unable to take her dog on long walks and is limited to short walks. She notes that if she talks continuously, she feels she is not getting enough oxygen.  She continues to experience a productive cough, especially in the mornings or when rolling over in bed, producing yellow sputum, which is gradually becoming clearer.  Her past medical history includes hypertension, for which she takes blood pressure medication, and a history of breast cancer diagnosed in 2001, treated with a lumpectomy. She denies any history of smoking and has no family history of lung disease.  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 She has a Designer, jewellery and denies any exposure to mold, feather pillows, or other potential allergens at home.  During the review of symptoms, she denies any history of sleep apnea or snoring. She mentions being more of a mouth breather, which she noticed during dental visits when required to breathe through her nose.  Interim history: Carolyn Mckinney is a 78 year old female who presents for follow-up after hospitalization for pneumonia.  Pulmonary symptoms and recent hospitalization - Hospitalized for pneumonia in July 2025 - CT scan during hospitalization showed resolution of pneumonia with mild residual bronchiectasis - Experienced pulmonary hypertension with right heart strain during hospitalization - Currently feels improved and has resumed normal activities, including increasing daily walking distance - No cough or congestion - Raspy voice in the mornings  Oral secretions - Yellow dried material present on lips every morning, described as 'very yellow' and accumulating overnight  Urinary incontinence and medication use - Takes VESIcare  (solifenacin ) for management of nighttime urinary incontinence - Questions whether yellow oral secretions may be related to VESIcare   Relevant pulmonary history Pets: Used to have a dog Occupation: Worked as a IT sales professional, went to [employer] school Exposures: No mold, hot tub, Financial controller.  No feather pillow or comforter ILD questionnaire 08/30/2023-all negative No h/o chemo/XRT/amiodarone/macrodantin/MTX  No exposure to asbestos, silica or other organic allergens  Smoking history: Never smoker Travel history: Lived in [place] for 48 years. Relevant family history: No family history of lung disease   Outpatient Encounter Medications as of 12/18/2023  Medication Sig   amLODipine  (NORVASC ) 5 MG tablet Take 1 tablet (5 mg total)  by mouth daily.   Calcium Carb-Cholecalciferol  (CALCIUM + D3 PO) Take 1 tablet by mouth daily.   DULoxetine  (CYMBALTA ) 60 MG capsule Take 1 capsule (60 mg total) by mouth 2 (two) times daily. Needs appt   Ferrous Sulfate  (IRON PO) Take 1 tablet by mouth daily.   losartan  (COZAAR ) 100 MG tablet Take 1 tablet (100 mg total) by mouth daily.   meloxicam  (MOBIC ) 7.5 MG tablet TAKE 1 TABLET EVERY DAY   Multiple Vitamin (MULTIVITAMIN ADULT PO) Take 1 tablet by mouth daily.   OXcarbazepine  (TRILEPTAL ) 150 MG tablet Take 1 tablet (150 mg total) by mouth 2 (two) times daily.   solifenacin  (VESICARE ) 5 MG tablet Take 1 tablet (5 mg total) by mouth daily. TAKE 1 TABLET EVERY DAY (NEED MD APPOINTMENT)   traZODone  (DESYREL ) 50 MG tablet Take 1-2 tablets (50-100 mg total) by mouth at bedtime as needed. for sleep   valACYclovir  (VALTREX ) 500 MG tablet TAKE 2 TABLETS EVERY DAY (NEED MD APPOINTMENT)   VITAMIN K PO Take 1 tablet by mouth daily.   zinc  gluconate 50 MG tablet Take 50 mg by mouth daily.   No facility-administered encounter medications on file as of 12/18/2023.   Vitals:   12/18/23 1333  BP: 116/80  Pulse: 81  Temp: 98.6 F (37 C)  Height: 5' (1.524 m)  Weight: 147 lb (66.7 kg)  SpO2: 95%  TempSrc: Oral  BMI (Calculated): 28.71     Physical Exam GEN: No acute distress. CV: Regular rate and rhythm, no murmurs. LUNGS: Clear to auscultation bilaterally, normal respiratory effort. SKIN JOINTS: Warm and dry, no rash.    Data Reviewed: Imaging: CTA 06/29/2023- No pulmonary embolism, widespread heterogeneous consolidation, dense groundglass disease, cardiomegaly High resolution CT 10/30/2023- complete resolution of multifocal pneumonia with mild residual lower lobe bronchiectasis I had reviewed the images personally.  PFTs: 11/08/2023 FVC 2.54 [111%], FEV1 2.14 [126%], F/F84, TLC 4.51 [9 9%], DLCO 15.15 [90%] Normal test  Labs: CTD serologies 07/01/2023-significant only for rheumatoid factor of 16.5, negative hypersensitivity  panel  Cardiac: Echocardiogram  06/30/2023-LVEF 55-60%, mild LVH, normal RV systolic size and function, moderate elevation in PA systolic pressure, estimated RVSP 48.6 10/30/2023-LVEF 65-70%.  Normal RV systolic size and function.  Trivial TR  Assessment & Plan Multilobar pneumonia Hospitalization for multilobar pneumonia with significant inflammation and consolidation in both lungs. There was concern for underlying interstitial lung disease; initial tests for autoimmune diseases were negative except for borderline elevated rheumatoid factor which is nonspecific.  There are no exposures noted no symptoms of connective tissue disease.   Pneumonia resolved with normal lung function tests and no evidence of interstitial lung disease on follow-up CT scan. Mild bronchiectasis identified on CT scan post-pneumonia hospitalization. Asymptomatic with no lung congestion.  Pulmonary hypertension Pulmonary hypertension noted during hospitalization likely secondary to pneumonia and high oxygen requirements, with echocardiogram showing stress on the right heart.  Repeat echocardiogram shows resolution of pulmonary hypertension  Raspy voice and yellow dried material on lips, etiology unclear Yellow dried material on lips possibly due to dry skin, unrelated to lung issues per CT scan. Raspy voice not linked to lung condition. Possible side effect of VESIcare  for incontinence, but unconfirmed. - Consult primary care provider regarding potential side effects of VESIcare .  Recommendations: Discussed results in detail with patient Follow-up as needed  Abrial Arrighi MD [facility] 12/18/2023, 1:44 PM  CC: Frann Mabel Mt*

## 2024-01-24 ENCOUNTER — Encounter: Payer: Self-pay | Admitting: Family Medicine

## 2024-02-17 ENCOUNTER — Other Ambulatory Visit: Payer: Self-pay | Admitting: Family Medicine

## 2024-02-17 DIAGNOSIS — R202 Paresthesia of skin: Secondary | ICD-10-CM

## 2024-03-04 ENCOUNTER — Ambulatory Visit

## 2024-03-04 VITALS — Ht 60.0 in | Wt 140.0 lb

## 2024-03-04 DIAGNOSIS — M5416 Radiculopathy, lumbar region: Secondary | ICD-10-CM | POA: Diagnosis not present

## 2024-03-04 DIAGNOSIS — M47816 Spondylosis without myelopathy or radiculopathy, lumbar region: Secondary | ICD-10-CM

## 2024-03-04 DIAGNOSIS — M545 Low back pain, unspecified: Secondary | ICD-10-CM | POA: Diagnosis not present

## 2024-03-04 DIAGNOSIS — M533 Sacrococcygeal disorders, not elsewhere classified: Secondary | ICD-10-CM | POA: Diagnosis not present

## 2024-03-04 DIAGNOSIS — G8929 Other chronic pain: Secondary | ICD-10-CM | POA: Diagnosis not present

## 2024-03-04 NOTE — Progress Notes (Addendum)
" ° °  Subjective:    Patient ID: Carolyn Mckinney, female    DOB: 78 y.o., 1946/03/16   MRN: [id number]  Chief Complaint: Low back pain  Discussed the use of AI scribe software for clinical note transcription with the patient, who gave verbal consent to proceed.  History of Present Illness [name] is a 78 year old female with chronic lower back pain who presents with recurrence of symptoms after prior epidural steroid injections.  Lumbosacral radicular pain - Chronic lower back pain with recurrence of symptoms after prior epidural steroid injections - Pain radiates down the nerve, primarily affecting the lateral aspects, especially on the right side - Last epidural steroid injection in early July provided significant relief - Recurrence of pain impacts daily activities, including walking her dog and attending church - Right side historically more problematic with past episodes of increased discomfort  Functional impairment and mobility limitations - Difficulty with mobility, particularly after prolonged sitting and when walking across parking lots, especially when fatigued - Difficulty navigating inclines without assistance - Previously had a handicap placard, which expired in September - Remains active with mild exercise and regular dog walking, but activity is limited by pain  Musculoskeletal history - History of foot fracture three years ago - Increased awareness of lack of cartilage in a specific area, more prominent now     Objective:   There were no vitals filed for this visit.  Low back exam: The patient appears well and is able to lift herself from her seat unassisted and without use of her hands on the armrests of the chair.  She is able to ambulate across the room, upon turning around and proceeding back to her chair, she does not appear to have a tremendous amount of steadiness when changing direction. No tenderness to palpation along the midline of her  lumbar spine.  Mild tenderness to palpation present along the bilateral paraspinal musculature in the lumbar spine greatest on the right.     Assessment & Plan:   Assessment & Plan Lumbar radiculopathy   Chronic lumbar radiculopathy has shown significant relief from previous epidural steroid injections at the L4-L5 level. A recent flare-up with increased symptoms is likely due to weather changes or increased activity. MRI findings align with the previous response to injections, which provided nearly 90% relief, confirming effective targeting of the L4-L5 level. Referred for an epidural steroid injection at L4-L5. Potential for SI joint injection discussed if symptoms persist.  Also given her slight unsteadiness noted on exam with up and go test, do feel that a handicap placard is more than reasonable.  I have provided her with a 5-year handicap placard and scanned this into our system.  Right sacroiliac joint pain   Chronic right sacroiliac joint pain presents with tenderness on examination, worsened by activity and prolonged sitting. Previous SI joint injections have been attempted, but symptoms continue. Carolyn consider an SI joint injection if the epidural steroid injection does not provide sufficient relief.    "

## 2024-03-05 ENCOUNTER — Other Ambulatory Visit: Payer: Self-pay

## 2024-03-05 DIAGNOSIS — M47816 Spondylosis without myelopathy or radiculopathy, lumbar region: Secondary | ICD-10-CM

## 2024-03-05 DIAGNOSIS — M545 Low back pain, unspecified: Secondary | ICD-10-CM

## 2024-03-05 DIAGNOSIS — M5416 Radiculopathy, lumbar region: Secondary | ICD-10-CM

## 2024-03-18 NOTE — Discharge Instructions (Signed)

## 2024-03-19 ENCOUNTER — Other Ambulatory Visit: Payer: Self-pay | Admitting: Family Medicine

## 2024-03-24 ENCOUNTER — Ambulatory Visit: Admission: RE | Admit: 2024-03-24 | Discharge: 2024-03-24 | Disposition: A | Source: Ambulatory Visit

## 2024-03-24 DIAGNOSIS — M5416 Radiculopathy, lumbar region: Secondary | ICD-10-CM

## 2024-03-24 DIAGNOSIS — M47816 Spondylosis without myelopathy or radiculopathy, lumbar region: Secondary | ICD-10-CM

## 2024-03-24 DIAGNOSIS — M545 Low back pain, unspecified: Secondary | ICD-10-CM

## 2024-03-24 MED ORDER — METHYLPREDNISOLONE ACETATE 40 MG/ML INJ SUSP (RADIOLOG
80.0000 mg | Freq: Once | INTRAMUSCULAR | Status: AC
  Administered 2024-03-24: 80 mg via EPIDURAL

## 2024-03-24 MED ORDER — IOPAMIDOL (ISOVUE-M 200) INJECTION 41%
1.0000 mL | Freq: Once | INTRAMUSCULAR | Status: AC
  Administered 2024-03-24: 1 mL via EPIDURAL

## 2024-04-03 ENCOUNTER — Ambulatory Visit

## 2024-04-03 VITALS — Ht 60.0 in | Wt 145.0 lb

## 2024-04-03 DIAGNOSIS — Z0000 Encounter for general adult medical examination without abnormal findings: Secondary | ICD-10-CM | POA: Diagnosis not present

## 2024-04-03 NOTE — Patient Instructions (Addendum)
 Carolyn Mckinney,  Thank you for taking the time for your Medicare Wellness Visit. I appreciate your continued commitment to your health goals. Please review the care plan we discussed, and feel free to reach out if I can assist you further.  Please note that Annual Wellness Visits do not include a physical exam. Some assessments may be limited, especially if the visit was conducted virtually. If needed, we may recommend an in-person follow-up with your provider.  Ongoing Care Seeing your primary care provider every 3 to 6 months helps us  monitor your health and provide consistent, personalized care.   Referrals If a referral was made during today's visit and you haven't received any updates within two weeks, please contact the referred provider directly to check on the status.  Recommended Screenings:  Health Maintenance  Topic Date Due   Flu Shot  11/23/2023   COVID-19 Vaccine (7 - 2025-26 season) 12/24/2023   Medicare Annual Wellness Visit  04/03/2025   DTaP/Tdap/Td vaccine (2 - Td or Tdap) 05/02/2028   Pneumococcal Vaccine for age over 56  Completed   Osteoporosis screening with Bone Density Scan  Completed   Hepatitis C Screening  Completed   Zoster (Shingles) Vaccine  Completed   Meningitis B Vaccine  Aged Out   Breast Cancer Screening  Discontinued   Colon Cancer Screening  Discontinued       04/03/2024    9:31 AM  Advanced Directives  Does Patient Have a Medical Advance Directive? Yes  Type of Estate Agent of [place];Living will  Does patient want to make changes to medical advance directive? No - Patient declined  Copy of Healthcare Power of Attorney in Chart? No - copy requested    Vision: Annual vision screenings are recommended for early detection of glaucoma, cataracts, and diabetic retinopathy. These exams can also reveal signs of chronic conditions such as diabetes and high blood pressure.  Dental: Annual dental screenings help detect early  signs of oral cancer, gum disease, and other conditions linked to overall health, including heart disease and diabetes.  Please see the attached documents for additional preventive care recommendations.

## 2024-04-03 NOTE — Progress Notes (Signed)
 Chief Complaint  Patient presents with   Medicare Wellness     Subjective:   Carolyn Mckinney is a 78 y.o. female who presents for a Medicare Annual Wellness Visit.  Visit info / Clinical Intake: Medicare Wellness Visit Type:: Subsequent Annual Wellness Visit Persons participating in visit and providing information:: patient Medicare Wellness Visit Mode:: Telephone If telephone:: video declined Since this visit was completed virtually, some vitals may be partially provided or unavailable. Missing vitals are due to the limitations of the virtual format.: Documented vitals are patient reported If Telephone or Video please confirm:: I connected with patient using audio/video enable telemedicine. I verified patient identity with two identifiers, discussed telehealth limitations, and patient agreed to proceed. Patient Location:: Home Provider Location:: Office Interpreter Needed?: No Pre-visit prep was completed: no AWV questionnaire completed by patient prior to visit?: no Living arrangements:: (!) lives alone Patient's Overall Health Status Rating: good Typical amount of pain: some Does pain affect daily life?: no Are you currently prescribed opioids?: no  Dietary Habits and Nutritional Risks How many meals a day?: 2 Eats fruit and vegetables daily?: yes Most meals are obtained by: preparing own meals In the last 2 weeks, have you had any of the following?: none Diabetic:: no  Functional Status Activities of Daily Living (to include ambulation/medication): Independent Ambulation: Independent with device- listed below Home Assistive Devices/Equipment: Eyeglasses; Cane Medication Administration: Independent Home Management (perform basic housework or laundry): Independent Manage your own finances?: yes Primary transportation is: driving Concerns about vision?: no *vision screening is required for WTM* Concerns about hearing?: no  Fall Screening Falls in the past year?:  1 Number of falls in past year: 0 Was there an injury with Fall?: 0 Fall Risk Category Calculator: 1 Patient Fall Risk Level: Low Fall Risk  Fall Risk Patient at Risk for Falls Due to: Impaired balance/gait Fall risk Follow up: Falls evaluation completed  Home and Transportation Safety: All rugs have non-skid backing?: yes All stairs or steps have railings?: N/A, no stairs Grab bars in the bathtub or shower?: yes Have non-skid surface in bathtub or shower?: yes Good home lighting?: yes Regular seat belt use?: yes Hospital stays in the last year:: (!) yes How many hospital stays:: 1 Reason: Pneumonia  Cognitive Assessment Difficulty concentrating, remembering, or making decisions? : no Will 6CIT or Mini Cog be Completed: yes What year is it?: 0 points What month is it?: 0 points Give patient an address phrase to remember (5 components): [address] [place]  About what time is it?: 0 points Count backwards from 20 to 1: 0 points Say the months of the year in reverse: 0 points Repeat the address phrase from earlier: 0 points 6 CIT Score: 0 points  Advance Directives (For Healthcare) Does Patient Have a Medical Advance Directive?: Yes Does patient want to make changes to medical advance directive?: No - Patient declined Type of Advance Directive: Healthcare Power of [place]; Living will Copy of Healthcare Power of Attorney in Chart?: No - copy requested Copy of Living Will in Chart?: No - copy requested  Reviewed/Updated  Reviewed/Updated: Reviewed All (Medical, Surgical, Family, Medications, Allergies, Care Teams, Patient Goals)    Allergies (verified) Patient has no known allergies.   Current Medications (verified) Outpatient Encounter Medications as of 04/03/2024  Medication Sig   amLODipine  (NORVASC ) 5 MG tablet TAKE 1 TABLET EVERY DAY   Calcium Carb-Cholecalciferol (CALCIUM + D3 PO) Take 1 tablet by mouth daily.   DULoxetine  (CYMBALTA ) 60 MG  capsule TAKE  1 CAPSULE TWICE DAILY (NEED MD APPOINTMENT)   Ferrous Sulfate  (IRON PO) Take 1 tablet by mouth daily.   losartan  (COZAAR ) 100 MG tablet Take 1 tablet (100 mg total) by mouth daily.   meloxicam  (MOBIC ) 7.5 MG tablet TAKE 1 TABLET EVERY DAY   Multiple Vitamin (MULTIVITAMIN ADULT PO) Take 1 tablet by mouth daily.   OXcarbazepine  (TRILEPTAL ) 150 MG tablet Take 1 tablet (150 mg total) by mouth 2 (two) times daily.   solifenacin  (VESICARE ) 5 MG tablet Take 1 tablet (5 mg total) by mouth daily. TAKE 1 TABLET EVERY DAY (NEED MD APPOINTMENT)   traZODone  (DESYREL ) 50 MG tablet Take 1-2 tablets (50-100 mg total) by mouth at bedtime as needed. for sleep   valACYclovir  (VALTREX ) 500 MG tablet TAKE 2 TABLETS EVERY DAY (NEED MD APPOINTMENT)   VITAMIN K PO Take 1 tablet by mouth daily.   zinc  gluconate 50 MG tablet Take 50 mg by mouth daily.   No facility-administered encounter medications on file as of 04/03/2024.    History: Past Medical History:  Diagnosis Date   Allergy    Cancer (HCC)    Breast   Cataract    cataract repair bilaterally   Closed patellar sleeve fracture of right knee 05/2019   Hypertension    Meniere's disease    Personal history of radiation therapy 2001   Left Breast Cancer   Past Surgical History:  Procedure Laterality Date   BREAST BIOPSY Left 2001   BREAST LUMPECTOMY Left 01/25/2000   COLONOSCOPY  2006   VAGINAL HYSTERECTOMY  1991   Done for bleeding. Benign pathology   Family History  Problem Relation Age of Onset   Arthritis Mother    Heart disease Father    Colon cancer Neg Hx    Colon polyps Neg Hx    Esophageal cancer Neg Hx    Rectal cancer Neg Hx    Stomach cancer Neg Hx    Social History   Occupational History   Not on file  Tobacco Use   Smoking status: Never   Smokeless tobacco: Never  Vaping Use   Vaping status: Never Used  Substance and Sexual Activity   Alcohol use: Yes    Comment: just wine/glass of wine in the evening   Drug use:  Never   Sexual activity: Not Currently   Tobacco Counseling Counseling given: No  SDOH Screenings   Food Insecurity: No Food Insecurity (04/03/2024)  Housing: Unknown (04/03/2024)  Transportation Needs: No Transportation Needs (04/03/2024)  Utilities: Not At Risk (04/03/2024)  Alcohol Screen: Low Risk (05/21/2023)  Depression (PHQ2-9): Low Risk (04/03/2024)  Financial Resource Strain: Low Risk (05/21/2023)  Physical Activity: Sufficiently Active (04/03/2024)  Social Connections: Moderately Integrated (04/03/2024)  Stress: No Stress Concern Present (04/03/2024)  Tobacco Use: Low Risk (04/03/2024)  Health Literacy: Adequate Health Literacy (04/03/2024)   See flowsheets for full screening details  Depression Screen PHQ 2 & 9 Depression Scale- Over the past 2 weeks, how often have you been bothered by any of the following problems? Little interest or pleasure in doing things: 0 Feeling down, depressed, or hopeless (PHQ Adolescent also includes...irritable): 0 PHQ-2 Total Score: 0 Trouble falling or staying asleep, or sleeping too much: 0 Feeling tired or having little energy: 0 Poor appetite or overeating (PHQ Adolescent also includes...weight loss): 0 Feeling bad about yourself - or that you are a failure or have let yourself or your family down: 0 Trouble concentrating on things, such as reading the  newspaper or watching television [facility] Adolescent also includes...like school work): 0 Moving or speaking so slowly that other people could have noticed. Or the opposite - being so fidgety or restless that you have been moving around a lot more than usual: 0 Thoughts that you would be better off dead, or of hurting yourself in some way: 0 PHQ-9 Total Score: 0     Goals Addressed               This Visit's Progress     Remain active (pt-stated)        Complete personal projects             Objective:    Today's Vitals   04/03/24 0931  Weight: 145 lb (65.8 kg)  Height:  5' (1.524 m)   Body mass index is 28.32 kg/m.  Hearing/Vision screen Hearing Screening - Comments:: Denies hearing difficulties   Vision Screening - Comments:: Wears rx glasses - up to date with routine eye exams with  Dr Raelyn Immunizations and Health Maintenance Health Maintenance  Topic Date Due   Influenza Vaccine  11/23/2023   COVID-19 Vaccine (7 - 2025-26 season) 12/24/2023   Medicare Annual Wellness (AWV)  04/03/2025   DTaP/Tdap/Td (2 - Td or Tdap) 05/02/2028   Pneumococcal Vaccine: 50+ Years  Completed   Bone Density Scan  Completed   Hepatitis C Screening  Completed   Zoster Vaccines- Shingrix   Completed   Meningococcal B Vaccine  Aged Out   Mammogram  Discontinued   Colonoscopy  Discontinued        Assessment/Plan:  This is a routine wellness examination for Carolyn Mckinney.  Patient Care Team: Frann Mabel Mt, DO as PCP - General (Family Medicine) Tobie Tonita POUR, DO as Consulting Physician (Neurology)  I have personally reviewed and noted the following in the patients chart:   Medical and social history Use of alcohol, tobacco or illicit drugs  Current medications and supplements including opioid prescriptions. Functional ability and status Nutritional status Physical activity Advanced directives List of other physicians Hospitalizations, surgeries, and ER visits in previous 12 months Vitals Screenings to include cognitive, depression, and falls Referrals and appointments  No orders of the defined types were placed in this encounter.  In addition, I have reviewed and discussed with patient certain preventive protocols, quality metrics, and best practice recommendations. A written personalized care plan for preventive services as well as general preventive health recommendations were provided to patient.   Rojelio LELON Blush, LPN   [id number]   Return in 53 weeks (on 04/09/2025).  After Visit Summary: (MyChart) Due to this being a telephonic visit,  the after visit summary with patients personalized plan was offered to patient via MyChart   Nurse Notes: No voiced or noted concerns at this time

## 2024-05-02 ENCOUNTER — Other Ambulatory Visit (HOSPITAL_BASED_OUTPATIENT_CLINIC_OR_DEPARTMENT_OTHER): Payer: Self-pay | Admitting: Family Medicine

## 2024-05-02 DIAGNOSIS — Z1231 Encounter for screening mammogram for malignant neoplasm of breast: Secondary | ICD-10-CM

## 2024-05-07 ENCOUNTER — Encounter: Payer: Self-pay | Admitting: Family Medicine

## 2024-05-07 ENCOUNTER — Ambulatory Visit (INDEPENDENT_AMBULATORY_CARE_PROVIDER_SITE_OTHER): Admitting: Family Medicine

## 2024-05-07 VITALS — BP 122/68 | HR 81 | Temp 98.0°F | Resp 16 | Ht 60.0 in | Wt 148.2 lb

## 2024-05-07 DIAGNOSIS — G4762 Sleep related leg cramps: Secondary | ICD-10-CM | POA: Diagnosis not present

## 2024-05-07 DIAGNOSIS — M5416 Radiculopathy, lumbar region: Secondary | ICD-10-CM

## 2024-05-07 DIAGNOSIS — Z23 Encounter for immunization: Secondary | ICD-10-CM

## 2024-05-07 DIAGNOSIS — M5481 Occipital neuralgia: Secondary | ICD-10-CM | POA: Diagnosis not present

## 2024-05-07 DIAGNOSIS — H6122 Impacted cerumen, left ear: Secondary | ICD-10-CM | POA: Diagnosis not present

## 2024-05-07 NOTE — Patient Instructions (Signed)
 OK to use Debrox (peroxide) in the ear to loosen up wax. Also recommend using a bulb syringe (for removing boogers from baby's noses) to flush through warm water and vinegar (3-4:1 ratio). An alternative, though more expensive, is an elephant ear washer wax removal kit. Do not use Q-tips as this can impact wax further.  Consider Vit K2 100-200 mcg/d.   Heat (pad or rice pillow in microwave) over affected area, 10-15 minutes twice daily.   Ice/cold pack over area for 10-15 min twice daily.  OK to take Tylenol  1000 mg (2 extra strength tabs) or 975 mg (3 regular strength tabs) every 6 hours as needed.  Let us  know if you need anything.  EXERCISES RANGE OF MOTION (ROM) AND STRETCHING EXERCISES  These exercises may help you when beginning to rehabilitate your issue. In order to successfully resolve your symptoms, you must improve your posture. These exercises are designed to help reduce the forward-head and rounded-shoulder posture which contributes to this condition. Your symptoms may resolve with or without further involvement from your physician, physical therapist or athletic trainer. While completing these exercises, remember:  Restoring tissue flexibility helps normal motion to return to the joints. This allows healthier, less painful movement and activity. An effective stretch should be held for at least 20 seconds, although you may need to begin with shorter hold times for comfort. A stretch should never be painful. You should only feel a gentle lengthening or release in the stretched tissue. Do not do any stretch or exercise that you cannot tolerate.  STRETCH- Axial Extensors Lie on your back on the floor. You may bend your knees for comfort. Place a rolled-up hand towel or dish towel, about 2 inches in diameter, under the part of your head that makes contact with the floor. Gently tuck your chin, as if trying to make a double chin, until you feel a gentle stretch at the base of your  head. Hold 15-20 seconds. Repeat 2-3 times. Complete this exercise 1 time per day.   STRETCH - Axial Extension  Stand or sit on a firm surface. Assume a good posture: chest up, shoulders drawn back, abdominal muscles slightly tense, knees unlocked (if standing) and feet hip width apart. Slowly retract your chin so your head slides back and your chin slightly lowers. Continue to look straight ahead. You should feel a gentle stretch in the back of your head. Be certain not to feel an aggressive stretch since this can cause headaches later. Hold for 15-20 seconds. Repeat 2-3 times. Complete this exercise 1 time per day.  STRETCH - Cervical Side Bend  Stand or sit on a firm surface. Assume a good posture: chest up, shoulders drawn back, abdominal muscles slightly tense, knees unlocked (if standing) and feet hip width apart. Without letting your nose or shoulders move, slowly tip your right / left ear to your shoulder until your feel a gentle stretch in the muscles on the opposite side of your neck. Hold 15-20 seconds. Repeat 2-3 times. Complete this exercise 1-2 times per day.  STRETCH - Cervical Rotators  Stand or sit on a firm surface. Assume a good posture: chest up, shoulders drawn back, abdominal muscles slightly tense, knees unlocked (if standing) and feet hip width apart. Keeping your eyes level with the ground, slowly turn your head until you feel a gentle stretch along the back and opposite side of your neck. Hold 15-20 seconds. Repeat 2-3 times. Complete this exercise 1-2 times per day.  RANGE OF  MOTION - Neck Circles  Stand or sit on a firm surface. Assume a good posture: chest up, shoulders drawn back, abdominal muscles slightly tense, knees unlocked (if standing) and feet hip width apart. Gently roll your head down and around from the back of one shoulder to the back of the other. The motion should never be forced or painful. Repeat the motion 10-20 times, or until you feel the  neck muscles relax and loosen. Repeat 2-3 times. Complete the exercise 1-2 times per day. STRENGTHENING EXERCISES - Cervical Strain and Sprain These exercises may help you when beginning to rehabilitate your injury. They may resolve your symptoms with or without further involvement from your physician, physical therapist, or athletic trainer. While completing these exercises, remember:  Muscles can gain both the endurance and the strength needed for everyday activities through controlled exercises. Complete these exercises as instructed by your physician, physical therapist, or athletic trainer. Progress the resistance and repetitions only as guided. You may experience muscle soreness or fatigue, but the pain or discomfort you are trying to eliminate should never worsen during these exercises. If this pain does worsen, stop and make certain you are following the directions exactly. If the pain is still present after adjustments, discontinue the exercise until you can discuss the trouble with your clinician.  STRENGTH - Cervical Flexors, Isometric Face a wall, standing about 6 inches away. Place a small pillow, a ball about 6-8 inches in diameter, or a folded towel between your forehead and the wall. Slightly tuck your chin and gently push your forehead into the soft object. Push only with mild to moderate intensity, building up tension gradually. Keep your jaw and forehead relaxed. Hold 10 to 20 seconds. Keep your breathing relaxed. Release the tension slowly. Relax your neck muscles completely before you start the next repetition. Repeat 2-3 times. Complete this exercise 1 time per day.  STRENGTH- Cervical Lateral Flexors, Isometric  Stand about 6 inches away from a wall. Place a small pillow, a ball about 6-8 inches in diameter, or a folded towel between the side of your head and the wall. Slightly tuck your chin and gently tilt your head into the soft object. Push only with mild to moderate  intensity, building up tension gradually. Keep your jaw and forehead relaxed. Hold 10 to 20 seconds. Keep your breathing relaxed. Release the tension slowly. Relax your neck muscles completely before you start the next repetition. Repeat 2-3 times. Complete this exercise 1 time per day.  STRENGTH - Cervical Extensors, Isometric  Stand about 6 inches away from a wall. Place a small pillow, a ball about 6-8 inches in diameter, or a folded towel between the back of your head and the wall. Slightly tuck your chin and gently tilt your head back into the soft object. Push only with mild to moderate intensity, building up tension gradually. Keep your jaw and forehead relaxed. Hold 10 to 20 seconds. Keep your breathing relaxed. Release the tension slowly. Relax your neck muscles completely before you start the next repetition. Repeat 2-3 times. Complete this exercise 1 time per day.  POSTURE AND BODY MECHANICS CONSIDERATIONS Keeping correct posture when sitting, standing or completing your activities will reduce the stress put on different body tissues, allowing injured tissues a chance to heal and limiting painful experiences. The following are general guidelines for improved posture. Your physician or physical therapist will provide you with any instructions specific to your needs. While reading these guidelines, remember: The exercises prescribed by your  provider will help you have the flexibility and strength to maintain correct postures. The correct posture provides the optimal environment for your joints to work. All of your joints have less wear and tear when properly supported by a spine with good posture. This means you will experience a healthier, less painful body. Correct posture must be practiced with all of your activities, especially prolonged sitting and standing. Correct posture is as important when doing repetitive low-stress activities (typing) as it is when doing a single heavy-load  activity (lifting).  PROLONGED STANDING WHILE SLIGHTLY LEANING FORWARD When completing a task that requires you to lean forward while standing in one place for a long time, place either foot up on a stationary 2- to 4-inch high object to help maintain the best posture. When both feet are on the ground, the low back tends to lose its slight inward curve. If this curve flattens (or becomes too large), then the back and your other joints will experience too much stress, fatigue more quickly, and can cause pain.   RESTING POSITIONS Consider which positions are most painful for you when choosing a resting position. If you have pain with flexion-based activities (sitting, bending, stooping, squatting), choose a position that allows you to rest in a less flexed posture. You would want to avoid curling into a fetal position on your side. If your pain worsens with extension-based activities (prolonged standing, working overhead), avoid resting in an extended position such as sleeping on your stomach. Most people will find more comfort when they rest with their spine in a more neutral position, neither too rounded nor too arched. Lying on a non-sagging bed on your side with a pillow between your knees, or on your back with a pillow under your knees will often provide some relief. Keep in mind, being in any one position for a prolonged period of time, no matter how correct your posture, can still lead to stiffness.  WALKING Walk with an upright posture. Your ears, shoulders, and hips should all line up. OFFICE WORK When working at a desk, create an environment that supports good, upright posture. Without extra support, muscles fatigue and lead to excessive strain on joints and other tissues.  CHAIR: A chair should be able to slide under your desk when your back makes contact with the back of the chair. This allows you to work closely. The chair's height should allow your eyes to be level with the upper part of  your monitor and your hands to be slightly lower than your elbows. Body position: Your feet should make contact with the floor. If this is not possible, use a foot rest. Keep your ears over your shoulders. This will reduce stress on your neck and low back.

## 2024-05-07 NOTE — Progress Notes (Signed)
 Chief Complaint  Patient presents with   Ear Pain    Ear Pain    Pt is here for left ear pain. Here w daughter who helps w hx.  Duration: 6 weeks Progression: waxing and waning Associated symptoms: runny/stuffy nose, decreased hearing, ringing, pain Denies: fevers, bleeding, or discharge from ear Treatment to date: Debrox  Electrical shock sensation in the back of her head. Stated a couple years ago. No inj or change in activity. Seems to be a/w her energy levels.   Hx of radiculopathy.  Had an injection with the radiology team a month and a half ago which was helpful for short period of time.  No recent injury or change in activity.  Cramps in LE's. Mainly at night. She stays well hydrated overall.  She takes 1 tab of vitamin K nightly which has not been helpful.  Past Medical History:  Diagnosis Date   Allergy    Cancer [facility])    Breast   Cataract    cataract repair bilaterally   Closed patellar sleeve fracture of right knee 05/2019   Hypertension    Meniere's disease    Personal history of radiation therapy 2001   Left Breast Cancer    BP 122/68 (BP Location: Left Arm, Patient Position: Sitting)   Pulse 81   Temp 98 F (36.7 C) (Oral)   Resp 16   Ht 5' (1.524 m)   Wt 148 lb 3.2 oz (67.2 kg)   SpO2 95%   BMI 28.94 kg/m  General: Awake, alert, appearing stated age HEENT:  L ear- Canal 100% obstructed with cerumen R ear- canal patent without drainage or erythema, TM is negative Nose- nares patent and without discharge Mouth- Lips, gums and dentition unremarkable, pharynx is without erythema or exudate Neck: No adenopathy Lungs: Normal effort, no accessory muscle use MSK: TTP over the proximal suboccipital triangle bilaterally without obvious deformity, edema, erythema, excessive warmth Neuro: 5/5 strength throughout, DTRs equal and symmetric, gait is cautious and at times antalgic Psych: Age appropriate judgment and insight, normal mood and affect  Impacted  cerumen of left ear  Bilateral occipital neuralgia  Lumbar radiculopathy  Nocturnal leg cramps  Need for influenza vaccination - Plan: Flu vaccine HIGH DOSE PF(Fluzone Trivalent)  Irrigation today.  Home care instructions verbalized and written down. It is not radiating towards the front of her head at this time.  Heat, ice, Tylenol , stretches and exercises.  Could consider physical therapy.  Could consider steroid injection. Appreciate sports medicine and radiology team providing injections.  She will reach out to them. Stay hydrated.  Consider 100 to 200 mcg of vitamin K2 daily.  Could consider a nonsedating muscle relaxer. Flu shot today. F/u as originally scheduled.  Pt and her daughter voiced understanding and agreement to the plan.  I spent 40 minutes with the patient and her daughter discussing the above plans in addition to reviewing her chart on the same day of the visit.  Carolyn [place], DO 05/07/2024 5:02 PM

## 2024-05-08 ENCOUNTER — Other Ambulatory Visit (HOSPITAL_BASED_OUTPATIENT_CLINIC_OR_DEPARTMENT_OTHER): Payer: Self-pay

## 2024-05-08 ENCOUNTER — Encounter (HOSPITAL_BASED_OUTPATIENT_CLINIC_OR_DEPARTMENT_OTHER): Payer: Self-pay

## 2024-05-08 ENCOUNTER — Ambulatory Visit (HOSPITAL_BASED_OUTPATIENT_CLINIC_OR_DEPARTMENT_OTHER)
Admission: RE | Admit: 2024-05-08 | Discharge: 2024-05-08 | Disposition: A | Discharge status: Home / Self Care | Source: Ambulatory Visit | Attending: Family Medicine | Admitting: Family Medicine

## 2024-05-08 DIAGNOSIS — Z1231 Encounter for screening mammogram for malignant neoplasm of breast: Secondary | ICD-10-CM | POA: Insufficient documentation

## 2024-05-08 MED ORDER — COMIRNATY 30 MCG/0.3ML IM SUSY
0.3000 mL | PREFILLED_SYRINGE | Freq: Once | INTRAMUSCULAR | 0 refills | Status: AC
  Filled 2024-05-08: qty 0.3, 1d supply, fill #0

## 2024-05-12 ENCOUNTER — Ambulatory Visit

## 2024-05-12 VITALS — BP 120/72 | Ht 60.0 in | Wt 148.0 lb

## 2024-05-12 DIAGNOSIS — M65332 Trigger finger, left middle finger: Secondary | ICD-10-CM

## 2024-05-12 DIAGNOSIS — M65331 Trigger finger, right middle finger: Secondary | ICD-10-CM | POA: Diagnosis present

## 2024-05-12 NOTE — Progress Notes (Addendum)
 "                                                                                                                                                                                                                        Subjective:    Patient ID: Carolyn Mckinney, female    DOB: 79 y.o., 1945-07-15   MRN: [id number]  Chief Complaint: Lumbar spondylosis, right SI joint pain (8-week follow-up)  Discussed the use of AI scribe software for clinical note transcription with the patient, who gave verbal consent to proceed.  History of Present Illness Carolyn Mckinney is a 79 year old left-handed female with bilateral trigger fingers who presents for evaluation of persistent finger symptoms.  Trigger Finger Symptoms: - Persistent triggering and clicking of the third and fourth fingers in both hands - Symptoms alternate between affected digits - Discomfort is exacerbated by daily activities - Significant impairment of hand function, particularly in the left (dominant) hand, impacting daily tasks  Prior Treatments: - Previous use of corticosteroid injections, braces, adhesive bandages, and splints - Only temporary relief from these interventions - Unable to recall the number or timing of corticosteroid injections     Objective:   Vitals:   05/12/24 1354  BP: 120/72   Bilateral hands: There is palpable triggering present at the third digit A1 pulley of her bilateral hands. There is also a palpable nodule that slides in and out of the A1 pulley in the fourth digit of her bilateral hands but does not cause Palpable triggering.  Left third trigger Finger Injection Procedure Note Kamaria Lucia February 10, 1946 Indications: Pain Procedure Details Verbal consent was obtained. Risks (including potential risk for skin lightening and potential atrophy), benefits and alternatives were discussed. The left third A1 pulley of the flexor digitorum tendons was identified. Prepped with Chloraprep  and Ethyl Chloride used for anesthesia. Under sterile conditions, patient injected with 20mg  Depo-Medrol , 0.5 cc of Mepivacaine 2%, and 0.25 cc of Sodium Bicarbonate 8.4% at the A1 pulley as visualized using the palmar crease aiming distally with 45 degree angle towards nodule; injected directly into tendon sheath. Medication flowed freely without resistance.      Right third trigger Finger Injection Procedure Note Lougenia Morrissey 26-Jun-1945 Indications: Pain Procedure Details Verbal consent was obtained. Risks (including potential risk for skin lightening and potential atrophy), benefits and alternatives were discussed. The right third A1 pulley of the flexor digitorum tendons was identified. Prepped with Chloraprep and Ethyl Chloride used  for anesthesia. Under sterile conditions, patient injected with 20mg  Depo-Medrol , 0.5 cc of Mepivacaine 2%, and 0.25 cc of Sodium Bicarbonate 8.4% at the A1 pulley as visualized using the palmar crease aiming distally with 45 degree angle towards nodule; injected directly into tendon sheath. Medication flowed freely without resistance.   Assessment & Plan:   Assessment & Plan Trigger finger, bilateral hands Chronic trigger finger affects both hands, causing persistent symptoms and functional limitations despite previous bracing. Symptoms alternate between the third and fourth fingers. Persistent inflammation and triggering suggest considering a corticosteroid injection. Her treatment history, including bracing, was reviewed. A corticosteroid injection was discussed as the next step, with adjunctive bracing as an option. The number of prior injections to the affected fingers Carolyn be confirmed before proceeding with further intervention.  Lumbar radiculopathy  Patient also noting that she has not had lumbar spinal injection since being evaluated by myself on 03/04/2024. Order placed again.   "

## 2024-05-14 NOTE — Addendum Note (Signed)
 Addended by: CHARLES ROGUE A on: 05/14/2024 10:59 AM   Modules accepted: Orders

## 2024-05-28 ENCOUNTER — Inpatient Hospital Stay: Admission: RE | Admit: 2024-05-28 | Discharge: 2024-05-28 | Disposition: A | Source: Ambulatory Visit

## 2024-05-28 DIAGNOSIS — M545 Low back pain, unspecified: Secondary | ICD-10-CM

## 2024-05-28 DIAGNOSIS — M5416 Radiculopathy, lumbar region: Secondary | ICD-10-CM

## 2024-05-28 MED ORDER — METHYLPREDNISOLONE ACETATE 40 MG/ML INJ SUSP (RADIOLOG
80.0000 mg | Freq: Once | INTRAMUSCULAR | Status: AC
  Administered 2024-05-28: 80 mg via EPIDURAL

## 2024-05-28 MED ORDER — IOPAMIDOL (ISOVUE-M 200) INJECTION 41%
1.0000 mL | Freq: Once | INTRAMUSCULAR | Status: AC
  Administered 2024-05-28: 1 mL via EPIDURAL

## 2024-05-28 NOTE — Discharge Instructions (Signed)

## 2025-04-09 ENCOUNTER — Ambulatory Visit
